# Patient Record
Sex: Male | Born: 1935 | Race: White | Hispanic: No | Marital: Married | Smoking: Former smoker
Health system: Southern US, Community
[De-identification: ages and names within clinical notes are randomized; demographics above are authoritative.]

## PROBLEM LIST (undated history)

## (undated) DIAGNOSIS — G4730 Sleep apnea, unspecified: Secondary | ICD-10-CM

## (undated) DIAGNOSIS — T7840XA Allergy, unspecified, initial encounter: Secondary | ICD-10-CM

## (undated) DIAGNOSIS — I495 Sick sinus syndrome: Secondary | ICD-10-CM

## (undated) DIAGNOSIS — I4891 Unspecified atrial fibrillation: Secondary | ICD-10-CM

## (undated) DIAGNOSIS — M1990 Unspecified osteoarthritis, unspecified site: Secondary | ICD-10-CM

## (undated) DIAGNOSIS — K5790 Diverticulosis of intestine, part unspecified, without perforation or abscess without bleeding: Secondary | ICD-10-CM

## (undated) DIAGNOSIS — I2510 Atherosclerotic heart disease of native coronary artery without angina pectoris: Secondary | ICD-10-CM

## (undated) DIAGNOSIS — K219 Gastro-esophageal reflux disease without esophagitis: Secondary | ICD-10-CM

## (undated) DIAGNOSIS — I272 Pulmonary hypertension, unspecified: Secondary | ICD-10-CM

## (undated) DIAGNOSIS — C801 Malignant (primary) neoplasm, unspecified: Secondary | ICD-10-CM

## (undated) DIAGNOSIS — I10 Essential (primary) hypertension: Secondary | ICD-10-CM

## (undated) DIAGNOSIS — K2289 Other specified disease of esophagus: Secondary | ICD-10-CM

## (undated) DIAGNOSIS — I509 Heart failure, unspecified: Secondary | ICD-10-CM

## (undated) DIAGNOSIS — E785 Hyperlipidemia, unspecified: Secondary | ICD-10-CM

## (undated) DIAGNOSIS — Z8709 Personal history of other diseases of the respiratory system: Secondary | ICD-10-CM

## (undated) DIAGNOSIS — I6529 Occlusion and stenosis of unspecified carotid artery: Secondary | ICD-10-CM

## (undated) DIAGNOSIS — I219 Acute myocardial infarction, unspecified: Secondary | ICD-10-CM

## (undated) DIAGNOSIS — R0602 Shortness of breath: Secondary | ICD-10-CM

## (undated) DIAGNOSIS — Z950 Presence of cardiac pacemaker: Secondary | ICD-10-CM

## (undated) DIAGNOSIS — E039 Hypothyroidism, unspecified: Secondary | ICD-10-CM

## (undated) DIAGNOSIS — K228 Other specified diseases of esophagus: Secondary | ICD-10-CM

## (undated) HISTORY — DX: Heart failure, unspecified: I50.9

## (undated) HISTORY — PX: CATARACT EXTRACTION: SUR2

## (undated) HISTORY — DX: Diverticulosis of intestine, part unspecified, without perforation or abscess without bleeding: K57.90

## (undated) HISTORY — PX: POLYPECTOMY: SHX149

## (undated) HISTORY — DX: Occlusion and stenosis of unspecified carotid artery: I65.29

## (undated) HISTORY — DX: Hyperlipidemia, unspecified: E78.5

## (undated) HISTORY — DX: Essential (primary) hypertension: I10

## (undated) HISTORY — DX: Atherosclerotic heart disease of native coronary artery without angina pectoris: I25.10

## (undated) HISTORY — PX: COX-MAZE MICROWAVE ABLATION: SHX1404

## (undated) HISTORY — DX: Shortness of breath: R06.02

## (undated) HISTORY — DX: Sleep apnea, unspecified: G47.30

## (undated) HISTORY — DX: Unspecified atrial fibrillation: I48.91

## (undated) HISTORY — DX: Other specified diseases of esophagus: K22.8

## (undated) HISTORY — DX: Other specified disease of esophagus: K22.89

## (undated) HISTORY — PX: EYE SURGERY: SHX253

## (undated) HISTORY — DX: Sick sinus syndrome: I49.5

## (undated) HISTORY — DX: Personal history of other diseases of the respiratory system: Z87.09

## (undated) HISTORY — DX: Pulmonary hypertension, unspecified: I27.20

## (undated) HISTORY — DX: Hypothyroidism, unspecified: E03.9

---

## 2003-02-01 HISTORY — PX: INSERT / REPLACE / REMOVE PACEMAKER: SUR710

## 2003-07-26 HISTORY — PX: CARDIAC CATHETERIZATION: SHX172

## 2003-07-28 ENCOUNTER — Inpatient Hospital Stay (HOSPITAL_COMMUNITY): Admission: EM | Admit: 2003-07-28 | Discharge: 2003-08-15 | Payer: Self-pay | Admitting: Emergency Medicine

## 2003-07-31 ENCOUNTER — Encounter: Payer: Self-pay | Admitting: Cardiology

## 2003-08-06 ENCOUNTER — Encounter (INDEPENDENT_AMBULATORY_CARE_PROVIDER_SITE_OTHER): Payer: Self-pay | Admitting: *Deleted

## 2003-08-06 HISTORY — PX: CORONARY ARTERY BYPASS GRAFT: SHX141

## 2003-08-06 HISTORY — PX: OTHER SURGICAL HISTORY: SHX169

## 2003-08-18 ENCOUNTER — Inpatient Hospital Stay (HOSPITAL_COMMUNITY): Admission: EM | Admit: 2003-08-18 | Discharge: 2003-08-21 | Payer: Self-pay | Admitting: Emergency Medicine

## 2003-08-20 ENCOUNTER — Encounter (INDEPENDENT_AMBULATORY_CARE_PROVIDER_SITE_OTHER): Payer: Self-pay | Admitting: *Deleted

## 2003-09-15 ENCOUNTER — Encounter (HOSPITAL_COMMUNITY): Admission: RE | Admit: 2003-09-15 | Discharge: 2003-12-14 | Payer: Self-pay | Admitting: Cardiology

## 2003-10-09 ENCOUNTER — Encounter: Admission: RE | Admit: 2003-10-09 | Discharge: 2003-10-09 | Payer: Self-pay | Admitting: Internal Medicine

## 2003-10-24 ENCOUNTER — Encounter: Admission: RE | Admit: 2003-10-24 | Discharge: 2003-10-24 | Payer: Self-pay | Admitting: Internal Medicine

## 2003-11-27 ENCOUNTER — Encounter: Admission: RE | Admit: 2003-11-27 | Discharge: 2003-11-27 | Payer: Self-pay | Admitting: Critical Care Medicine

## 2003-12-16 HISTORY — PX: US ECHOCARDIOGRAPHY: HXRAD669

## 2003-12-17 ENCOUNTER — Encounter (HOSPITAL_COMMUNITY): Admission: RE | Admit: 2003-12-17 | Discharge: 2003-12-20 | Payer: Self-pay | Admitting: Cardiology

## 2003-12-23 ENCOUNTER — Encounter (HOSPITAL_COMMUNITY): Admission: RE | Admit: 2003-12-23 | Discharge: 2004-03-22 | Payer: Self-pay | Admitting: Cardiology

## 2004-02-28 ENCOUNTER — Emergency Department (HOSPITAL_COMMUNITY): Admission: EM | Admit: 2004-02-28 | Discharge: 2004-02-28 | Payer: Self-pay | Admitting: Family Medicine

## 2004-03-01 ENCOUNTER — Ambulatory Visit: Payer: Self-pay | Admitting: Critical Care Medicine

## 2004-03-31 ENCOUNTER — Encounter (HOSPITAL_COMMUNITY): Admission: RE | Admit: 2004-03-31 | Discharge: 2004-06-29 | Payer: Self-pay | Admitting: Cardiology

## 2004-05-18 ENCOUNTER — Encounter: Admission: RE | Admit: 2004-05-18 | Discharge: 2004-05-18 | Payer: Self-pay | Admitting: Orthopedic Surgery

## 2004-07-01 ENCOUNTER — Encounter (HOSPITAL_COMMUNITY): Admission: RE | Admit: 2004-07-01 | Discharge: 2004-09-29 | Payer: Self-pay | Admitting: Cardiology

## 2004-10-01 ENCOUNTER — Encounter (HOSPITAL_COMMUNITY): Admission: RE | Admit: 2004-10-01 | Discharge: 2004-12-30 | Payer: Self-pay | Admitting: Cardiology

## 2005-07-19 ENCOUNTER — Emergency Department (HOSPITAL_COMMUNITY): Admission: EM | Admit: 2005-07-19 | Discharge: 2005-07-19 | Payer: Self-pay | Admitting: Family Medicine

## 2005-10-29 IMAGING — CR DG CHEST 1V PORT
1 series · 1 of 1 positions shown · non-contrast
Comparison: none

CLINICAL DATA: Bradycardia.
 PORTABLE CHEST, ONE VIEW ? 08/07/2003 ? (1062 HOURS)
 Comparison to earlier film of the same day.
 Bilateral chest tubes, mediastinal drains, swan-ganz catheter have been removed.  Right IJ venous sheath and right IJ central line stable in position.  No pneumothorax.  Patchy air space opacities in the right upper lobe, left mid and lower lung are largely stable.  No effusion.
 IMPRESSION
 1. Bilateral chest tube removal without pneumothorax.
 2. Stable bilateral asymmetric infiltrates.

[view not recorded]
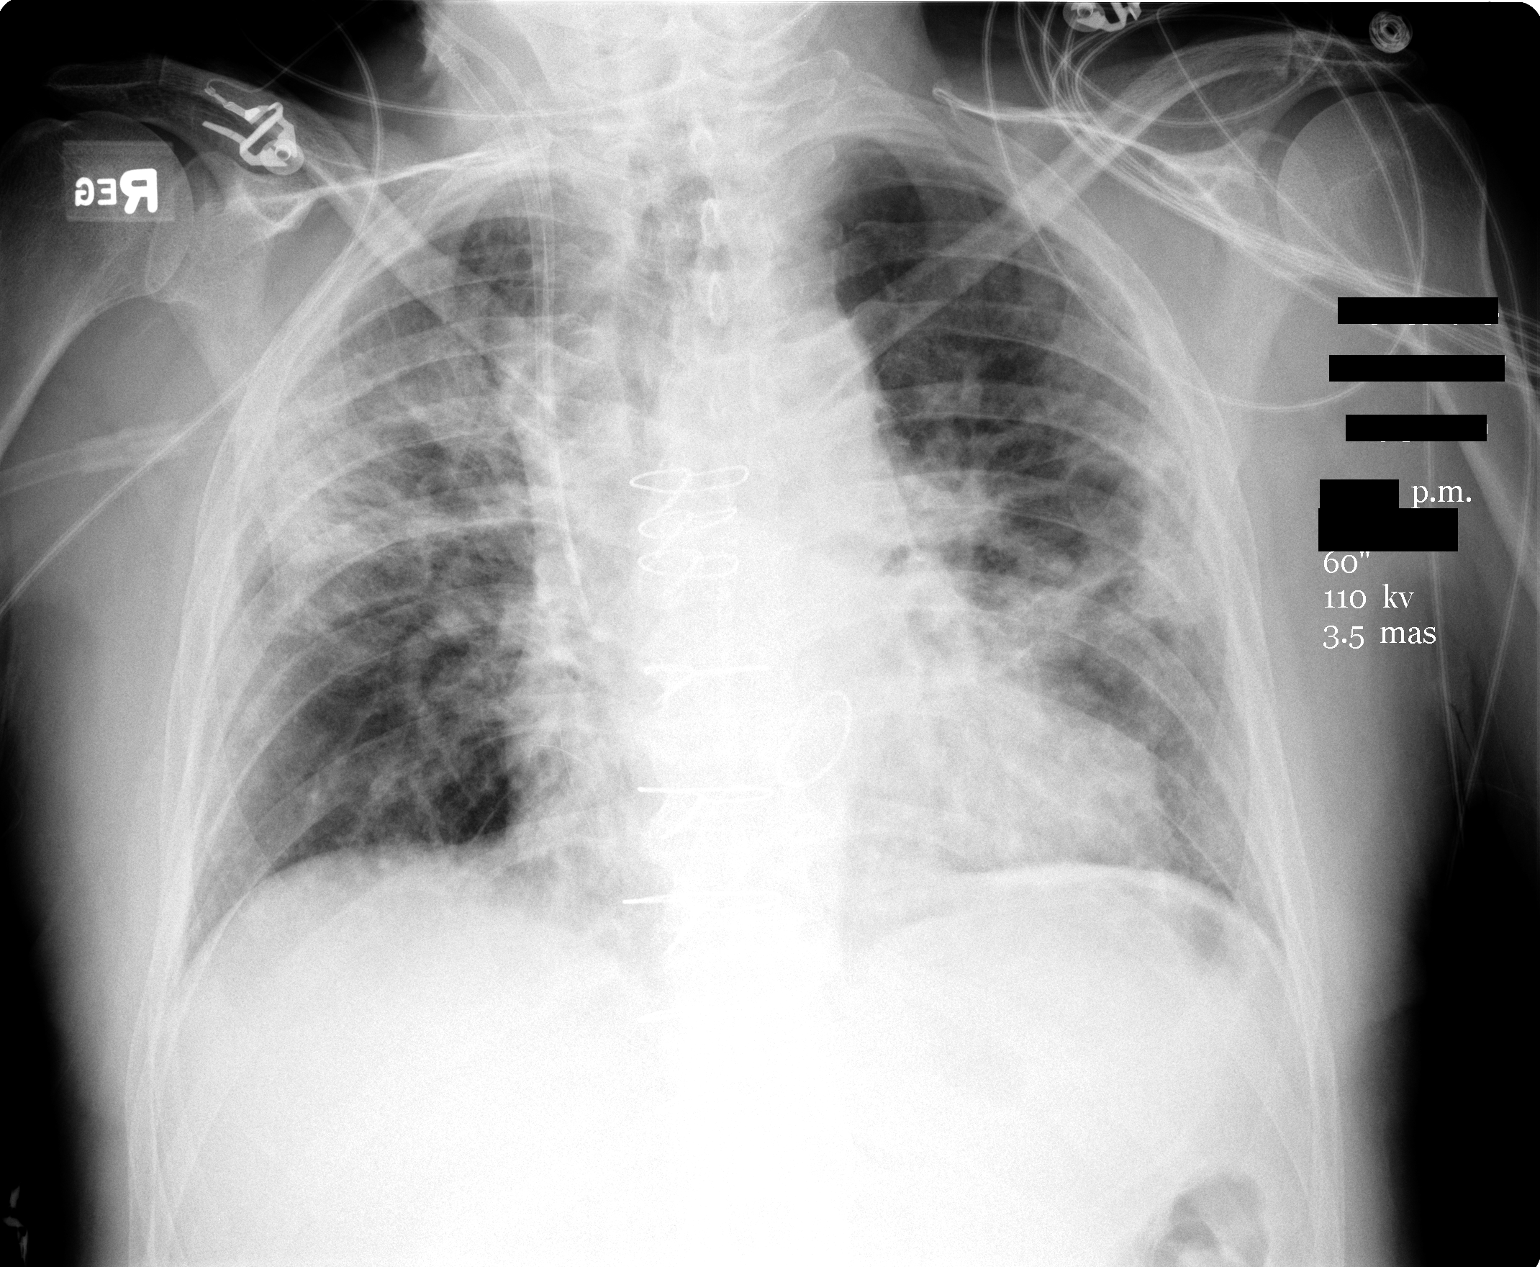

[1 of 1 positions shown; findings below may reference images not displayed]

## 2005-10-30 IMAGING — CR DG CHEST 1V PORT
1 series · 1 of 1 positions shown · non-contrast
Comparison: 08/07/03.

CLINICAL DATA: Bradycardia.  Chest tube removal.  
 CHEST ONE VIEW PORTABLE AT 0443 HOURS

[view not recorded]
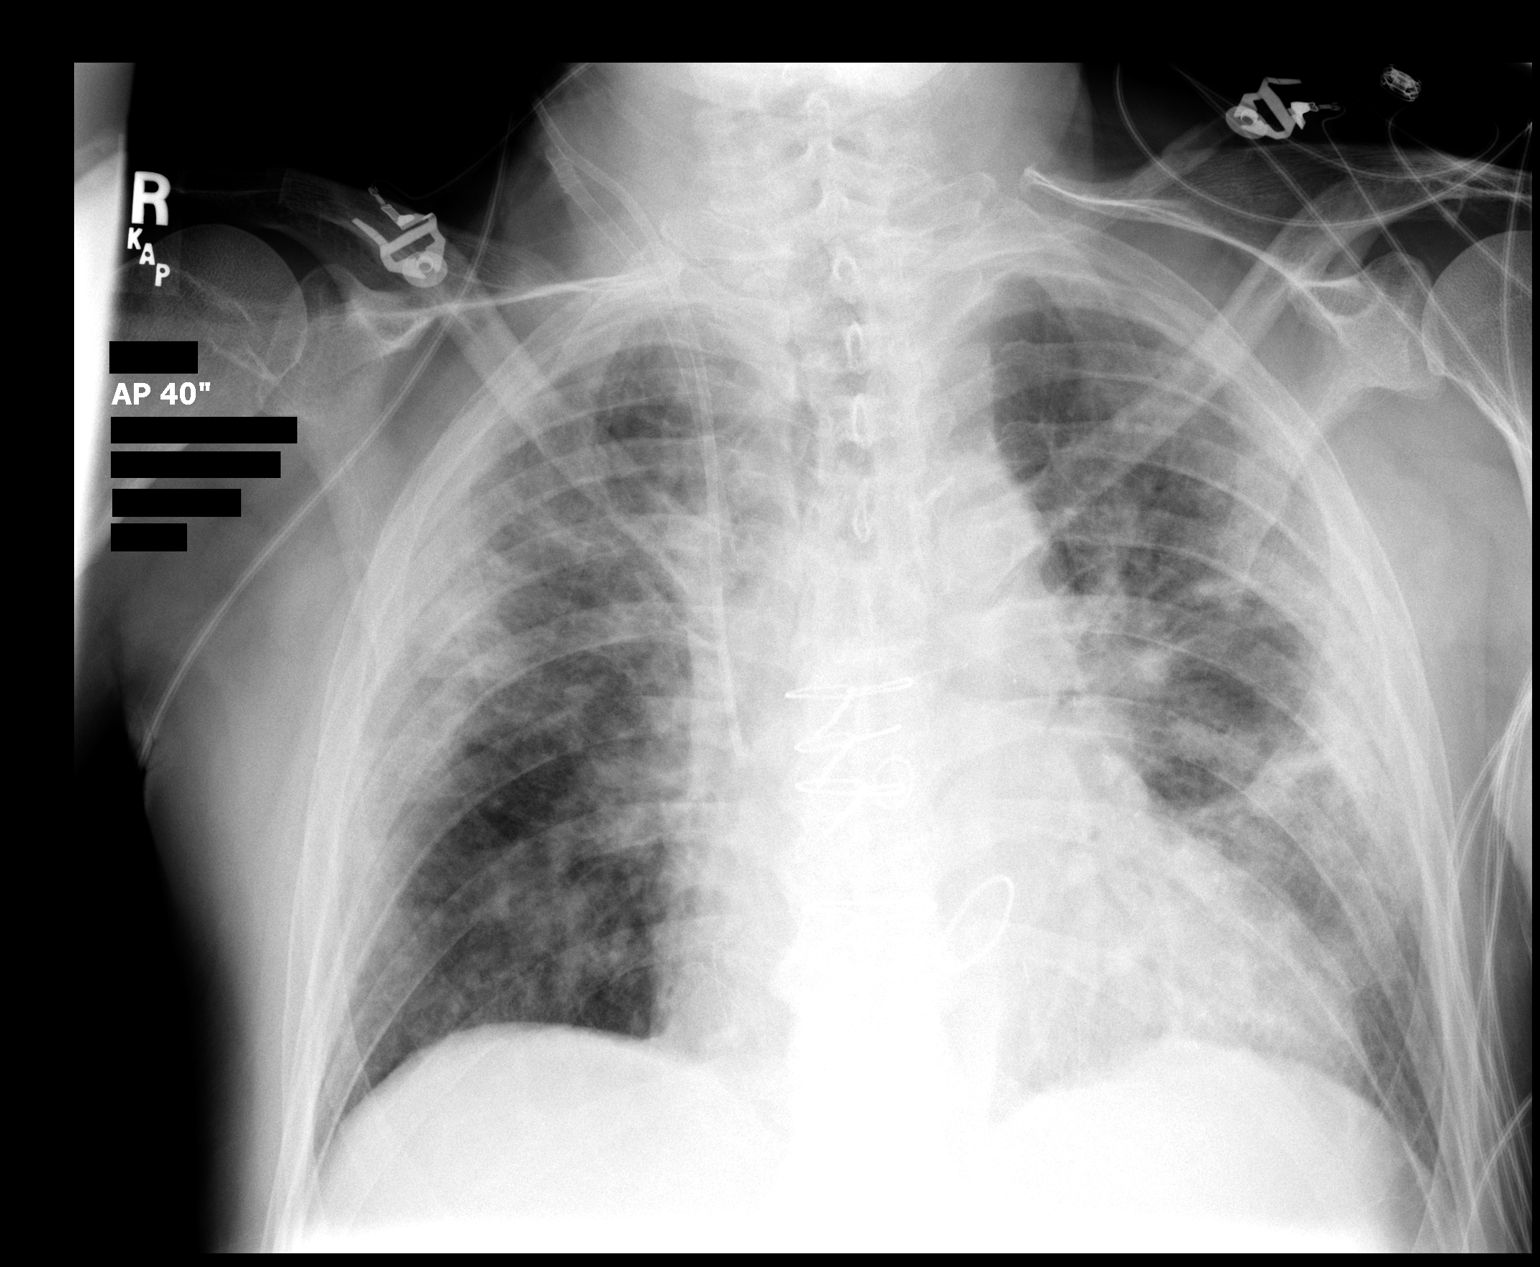

[1 of 1 positions shown; findings below may reference images not displayed]

Two central lines remain in place, grossly unchanged.  Some patchy bilateral lung density persists and is not significantly changed.  There is no pneumothorax on either side.  
 IMPRESSION
 No change.

## 2005-12-31 IMAGING — CR DG FOOT COMPLETE 3+V*L*
3 series · 3 of 3 positions shown · non-contrast
Comparison: none

CLINICAL DATA: Left hip pain, left foot pain and swelling since twist injury on 10/01/03.
 DIAGNOSTIC HIP, COMPLETE, LEFT ? 10/09/03 
 No significant osseous, articular, nor soft tissue abnormality is seen. 
 IMPRESSION   
 Negative.
 DIAGNOSTIC LEFT FOOT, COMPLETE (THREE VIEWS) ? 10/09/03 
 No fracture or subluxation is seen.  3 mm posterior and inferior calcaneal spurs are seen with mild degenerative changes at the anterior mid tarsal bones.  
 IMPRESSION
 1.  Slight degenerative changes, as described.
 2.  No other significant abnormality noted.

[view not recorded (1 of 3)]
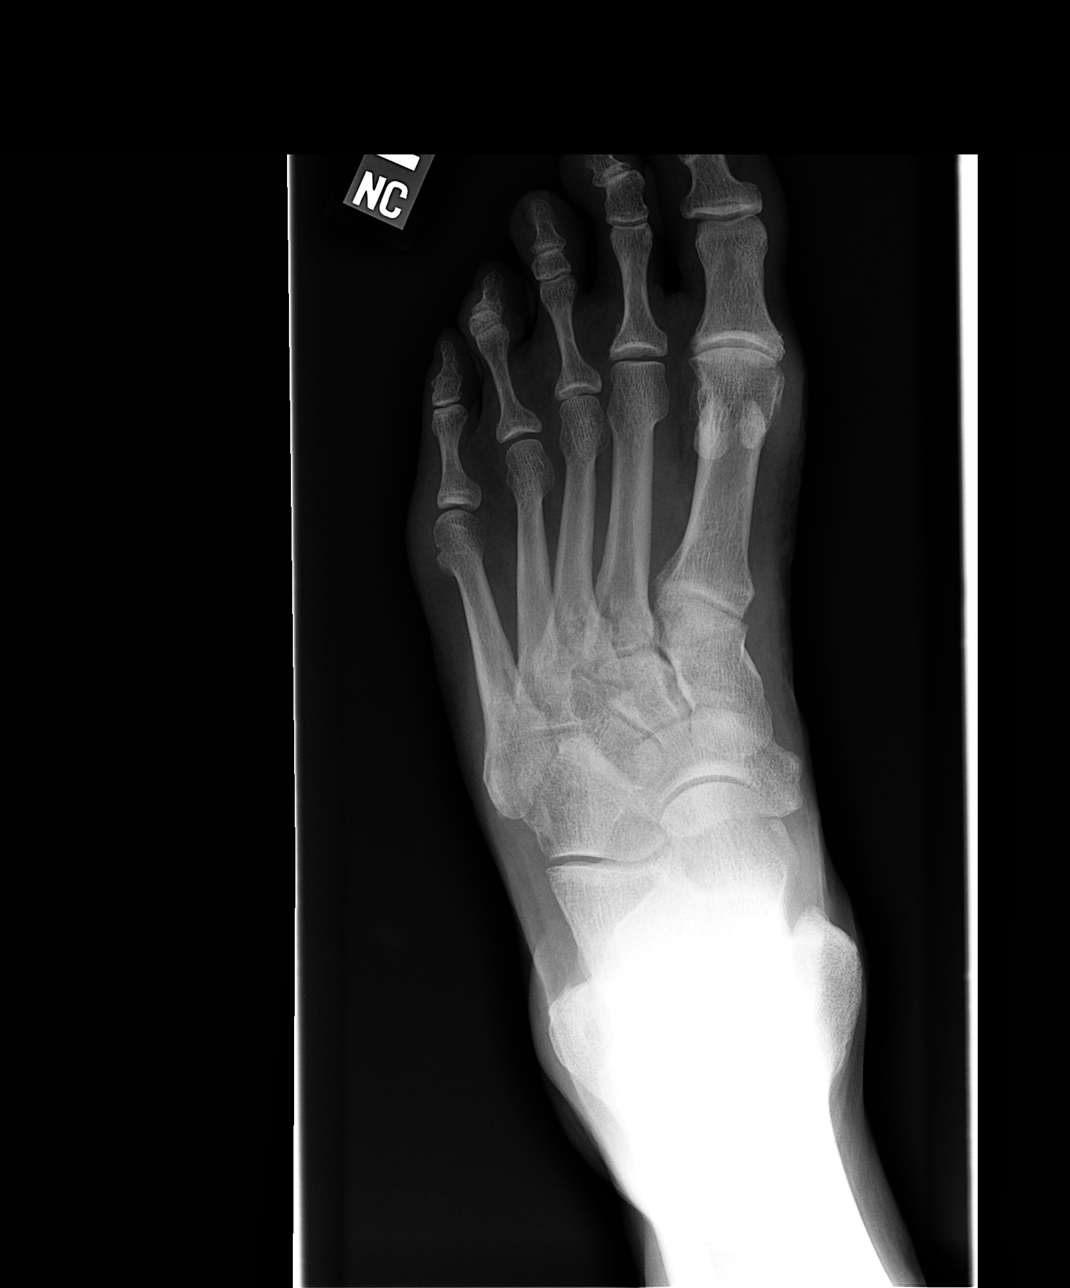

[view not recorded (2 of 3)]
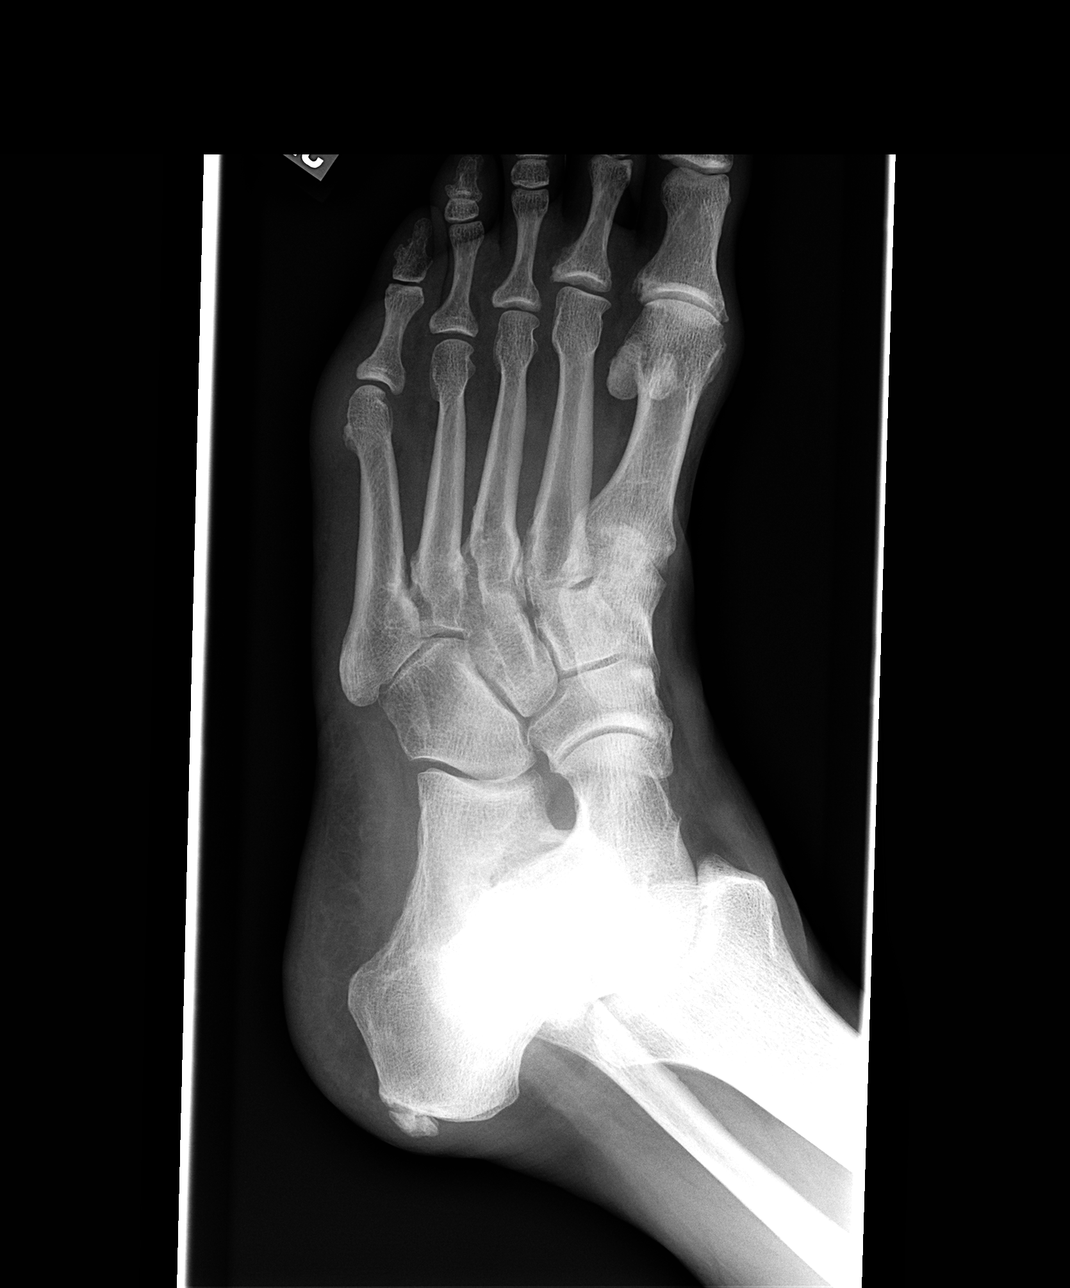

[view not recorded (3 of 3)]
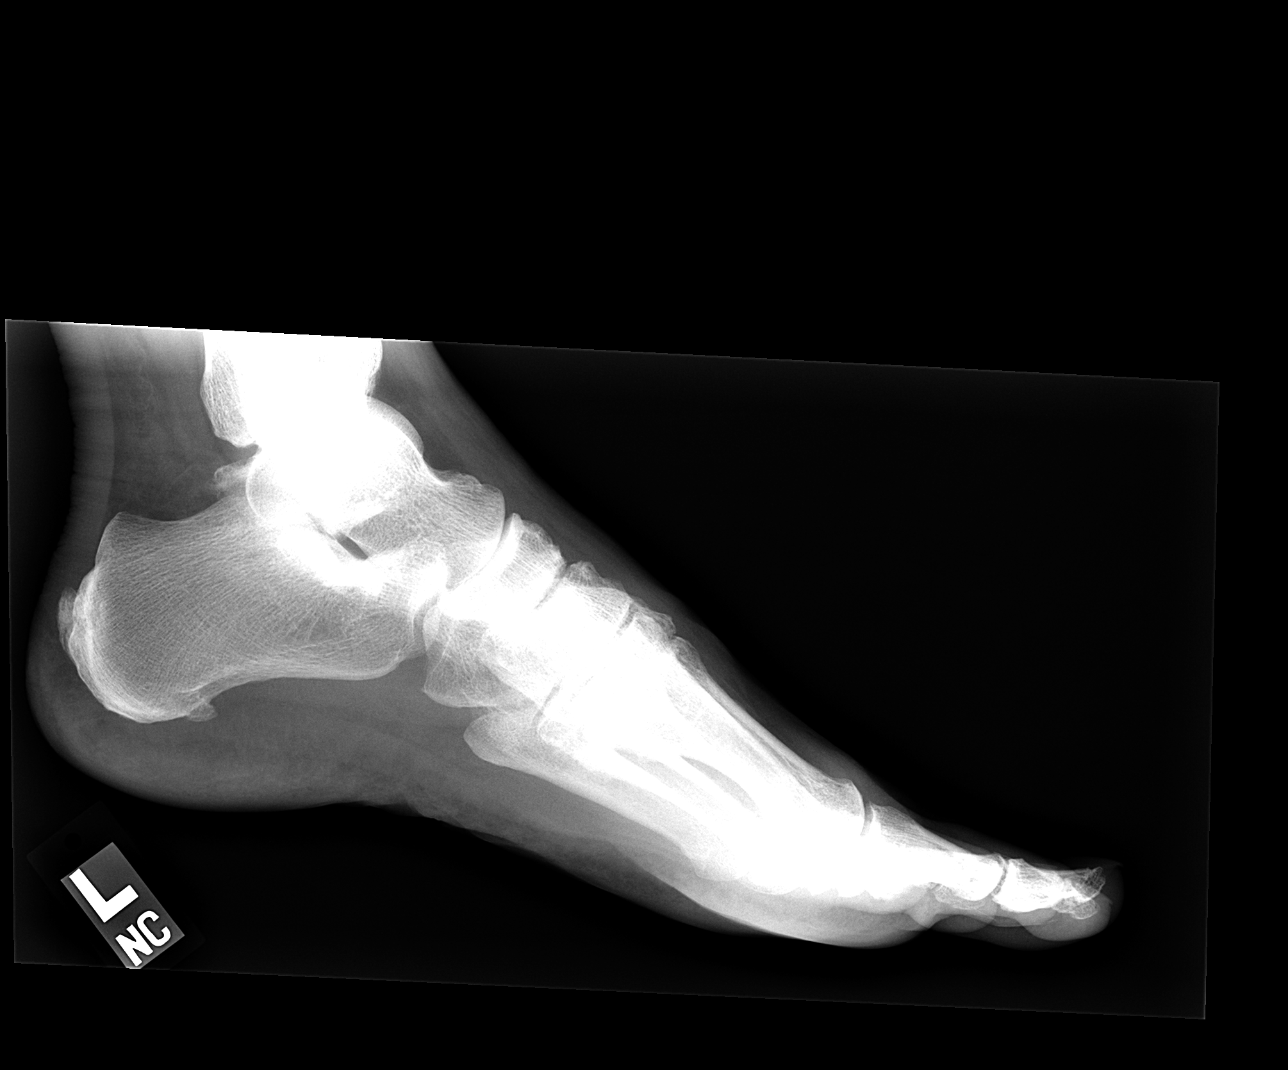

[3 of 3 positions shown; findings below may reference images not displayed]

## 2006-03-07 ENCOUNTER — Ambulatory Visit: Payer: Self-pay | Admitting: Vascular Surgery

## 2006-07-11 HISTORY — PX: CARDIOVASCULAR STRESS TEST: SHX262

## 2006-07-12 HISTORY — PX: US ECHOCARDIOGRAPHY: HXRAD669

## 2006-08-08 ENCOUNTER — Inpatient Hospital Stay (HOSPITAL_BASED_OUTPATIENT_CLINIC_OR_DEPARTMENT_OTHER): Admission: RE | Admit: 2006-08-08 | Discharge: 2006-08-08 | Payer: Self-pay | Admitting: Cardiology

## 2006-08-08 HISTORY — PX: CARDIAC CATHETERIZATION: SHX172

## 2006-09-06 ENCOUNTER — Ambulatory Visit: Payer: Self-pay | Admitting: Emergency Medicine

## 2006-09-06 LAB — CONVERTED CEMR LAB
Anti Nuclear Antibody(ANA): NEGATIVE
Rhuematoid fact SerPl-aCnc: <20 intl units/mL — ABNORMAL LOW (ref 0.0–20.0)

## 2006-09-12 ENCOUNTER — Ambulatory Visit: Payer: Self-pay | Admitting: Cardiology

## 2006-09-27 ENCOUNTER — Ambulatory Visit: Payer: Self-pay | Admitting: Emergency Medicine

## 2006-10-10 ENCOUNTER — Ambulatory Visit: Payer: Self-pay | Admitting: Emergency Medicine

## 2006-10-17 ENCOUNTER — Ambulatory Visit (HOSPITAL_COMMUNITY): Admission: RE | Admit: 2006-10-17 | Discharge: 2006-10-17 | Payer: Self-pay | Admitting: Emergency Medicine

## 2006-10-26 DIAGNOSIS — I2510 Atherosclerotic heart disease of native coronary artery without angina pectoris: Secondary | ICD-10-CM | POA: Insufficient documentation

## 2006-10-26 DIAGNOSIS — E785 Hyperlipidemia, unspecified: Secondary | ICD-10-CM | POA: Insufficient documentation

## 2006-10-26 DIAGNOSIS — I10 Essential (primary) hypertension: Secondary | ICD-10-CM | POA: Insufficient documentation

## 2006-10-26 DIAGNOSIS — E039 Hypothyroidism, unspecified: Secondary | ICD-10-CM | POA: Insufficient documentation

## 2006-10-26 DIAGNOSIS — M109 Gout, unspecified: Secondary | ICD-10-CM

## 2006-10-26 DIAGNOSIS — K222 Esophageal obstruction: Secondary | ICD-10-CM

## 2006-10-26 DIAGNOSIS — J309 Allergic rhinitis, unspecified: Secondary | ICD-10-CM | POA: Insufficient documentation

## 2006-11-06 ENCOUNTER — Ambulatory Visit: Payer: Self-pay | Admitting: *Deleted

## 2007-01-03 ENCOUNTER — Encounter: Payer: Self-pay | Admitting: Emergency Medicine

## 2007-01-03 ENCOUNTER — Ambulatory Visit: Payer: Self-pay | Admitting: Emergency Medicine

## 2007-01-03 DIAGNOSIS — R1313 Dysphagia, pharyngeal phase: Secondary | ICD-10-CM

## 2007-01-03 DIAGNOSIS — J8410 Pulmonary fibrosis, unspecified: Secondary | ICD-10-CM

## 2007-01-08 ENCOUNTER — Telehealth (INDEPENDENT_AMBULATORY_CARE_PROVIDER_SITE_OTHER): Payer: Self-pay | Admitting: *Deleted

## 2007-01-08 ENCOUNTER — Ambulatory Visit: Payer: Self-pay | Admitting: Internal Medicine

## 2007-01-10 ENCOUNTER — Telehealth: Payer: Self-pay | Admitting: Emergency Medicine

## 2007-01-12 ENCOUNTER — Ambulatory Visit: Payer: Self-pay | Admitting: Pulmonary Disease

## 2007-01-12 DIAGNOSIS — J209 Acute bronchitis, unspecified: Secondary | ICD-10-CM | POA: Insufficient documentation

## 2007-01-19 ENCOUNTER — Ambulatory Visit: Payer: Self-pay | Admitting: Emergency Medicine

## 2007-01-19 DIAGNOSIS — R059 Cough, unspecified: Secondary | ICD-10-CM | POA: Insufficient documentation

## 2007-01-19 DIAGNOSIS — R05 Cough: Secondary | ICD-10-CM

## 2007-01-29 ENCOUNTER — Telehealth: Payer: Self-pay | Admitting: Emergency Medicine

## 2007-01-30 ENCOUNTER — Ambulatory Visit: Payer: Self-pay | Admitting: Internal Medicine

## 2007-11-20 ENCOUNTER — Ambulatory Visit: Payer: Self-pay | Admitting: *Deleted

## 2008-08-06 ENCOUNTER — Ambulatory Visit: Payer: Self-pay | Admitting: Emergency Medicine

## 2008-12-02 ENCOUNTER — Ambulatory Visit: Payer: Self-pay | Admitting: Vascular Surgery

## 2009-06-01 ENCOUNTER — Ambulatory Visit: Payer: Self-pay | Admitting: Emergency Medicine

## 2009-06-01 ENCOUNTER — Ambulatory Visit: Payer: Self-pay | Admitting: Internal Medicine

## 2009-06-02 ENCOUNTER — Encounter: Payer: Self-pay | Admitting: Emergency Medicine

## 2009-06-17 ENCOUNTER — Encounter: Payer: Self-pay | Admitting: Emergency Medicine

## 2009-06-22 ENCOUNTER — Encounter: Payer: Self-pay | Admitting: Emergency Medicine

## 2009-06-24 ENCOUNTER — Ambulatory Visit: Payer: Self-pay | Admitting: Emergency Medicine

## 2009-06-25 ENCOUNTER — Telehealth: Payer: Self-pay | Admitting: Emergency Medicine

## 2009-06-25 DIAGNOSIS — J9611 Chronic respiratory failure with hypoxia: Secondary | ICD-10-CM

## 2009-07-07 ENCOUNTER — Ambulatory Visit: Payer: Self-pay | Admitting: Vascular Surgery

## 2009-10-13 ENCOUNTER — Ambulatory Visit (HOSPITAL_BASED_OUTPATIENT_CLINIC_OR_DEPARTMENT_OTHER): Admission: RE | Admit: 2009-10-13 | Discharge: 2009-10-13 | Payer: Self-pay | Admitting: Emergency Medicine

## 2009-10-13 ENCOUNTER — Encounter: Payer: Self-pay | Admitting: Pulmonary Disease

## 2009-11-04 ENCOUNTER — Ambulatory Visit: Payer: Self-pay | Admitting: Pulmonary Disease

## 2009-12-24 ENCOUNTER — Encounter: Payer: Self-pay | Admitting: Internal Medicine

## 2009-12-29 ENCOUNTER — Ambulatory Visit: Payer: Self-pay | Admitting: Emergency Medicine

## 2010-01-06 ENCOUNTER — Ambulatory Visit: Payer: Self-pay | Admitting: Vascular Surgery

## 2010-02-17 ENCOUNTER — Ambulatory Visit
Admission: RE | Admit: 2010-02-17 | Discharge: 2010-02-17 | Payer: Self-pay | Source: Home / Self Care | Attending: Internal Medicine | Admitting: Internal Medicine

## 2010-02-17 DIAGNOSIS — I4891 Unspecified atrial fibrillation: Secondary | ICD-10-CM | POA: Insufficient documentation

## 2010-02-17 DIAGNOSIS — I442 Atrioventricular block, complete: Secondary | ICD-10-CM | POA: Insufficient documentation

## 2010-02-17 DIAGNOSIS — Z950 Presence of cardiac pacemaker: Secondary | ICD-10-CM | POA: Insufficient documentation

## 2010-02-25 ENCOUNTER — Ambulatory Visit: Admission: RE | Admit: 2010-02-25 | Discharge: 2010-02-25 | Payer: Self-pay | Source: Home / Self Care

## 2010-02-25 ENCOUNTER — Ambulatory Visit (HOSPITAL_COMMUNITY)
Admission: RE | Admit: 2010-02-25 | Discharge: 2010-02-25 | Payer: Self-pay | Source: Home / Self Care | Attending: Internal Medicine | Admitting: Internal Medicine

## 2010-02-25 ENCOUNTER — Ambulatory Visit: Payer: Self-pay | Admitting: Cardiology

## 2010-03-04 NOTE — Assessment & Plan Note (Signed)
Summary: ILD, allergies   Visit Type:  Follow-up Primary Provider/Referring Provider:  Perini  CC:  3 wk followup.  Pt states that his breathing is some better since last seen by TP on 06/01/09.  He states that recently went to [facility] and he was able to walk very long distances in 90 degree weather without any dyspnea.  He states that since he returned to [facility] he is having SOB again but has started clariton and this has helped some.  Marland Kitchen  History of Present Illness: Mr Trefz is a 77 yo man, former smoker, with a hx of chronic cough and also ILD in a UIP pattern. Has aspiration on modified barium swallow that is largely avoided by chin tuck, taking meds in applesauce.   ROV 08/06/08 -- Has not been seen since 12/08. Has largely done well, able to do some activities, cut grass, etc. Cough is now much better, takes his meds with applesauce. Doesn't really do chin tuck. Started to notice worsening SOB when the weather got hot. Still having some trouble climbing stairs or even taking a shower. No edema. Last CT scan of the chest was in 12/08.   Jun 01, 2009--Presents for follow up of persistent symtpoms of dyspnea. Complains of increased SOB with activity x1.39months. Complains that his activity tolerance is not where he would like it. He is leaving on vacation soon and is worried that he will not be able to keep up with all the walking. He is quite [place] with yard work-mows grass. He declines mainly w/ actiivties at an incline-steps/hllls. if he rests symptoms relieved. At rest and on flat surfaces he does well. Today FEV1 at 77% w/ no desaturation  below 90% w/ walking in office. Denies chest pain, exertional chest pain.  orthopnea, hemoptysis, fever, n/v/d, edema, headache,recent travel or antibiotics.    ROV 06/24/09 -- returns for f/u ILD and chronic dyspnea. Seen 5/2 and at that time did not desaturate, had stable CXR. He just went to [place], did great with regard to his breathing. Now back in [place]  and he notices more dyspnea. He also has more PND, started loratadine a week ago. Has had his ONO, minimal desats per the company's report to the pt. I haven't reviewed yet. Followed by Dr Swaziland [place] cardiology.   Current Medications (verified): 1)  Synthroid 100 Mcg  Tabs (Levothyroxine Sodium) .... Take 1 Tablet By Mouth Once A Day 2)  Prevacid 30 Mg  Cpdr (Lansoprazole) .... Take 1 Capsule By Mouth Once A Day 3)  Allopurinol 100 Mg  Tabs (Allopurinol) .... Take 1 Tablet By Mouth Once A Day 4)  Atenolol 50 Mg  Tabs (Atenolol) .... Take 1 Tablet By Mouth Once A Day 5)  Cvs Aspirin Ec 81 Mg  Tbec (Aspirin) .... Take 1 Tablet By Mouth Once A Day 6)  Norvasc 2.5 Mg  Tabs (Amlodipine Besylate) .... Take 1 Tablet By Mouth Once A Day 7)  Coumadin 5 Mg  Tabs (Warfarin Sodium) .... Take 1 Tablet By Mouth Once A Day As Directed By Coumadin Clinic 8)  Klor-Con M20 20 Meq  Tbcr (Potassium Chloride Crys Cr) .... Take 1 Tablet By Mouth Once A Day 9)  Fenofibrate Micronized 200 Mg Caps (Fenofibrate Micronized) .... Once Daily 10)  Atenolol 50 Mg Tabs (Atenolol) .... Once Daily 11)  Benazepril Hcl 10 Mg Tabs (Benazepril Hcl) .... Once Daily 12)  Lovaza 1 Gm Caps (Omega-3-Acid Ethyl Esters) .... 4 Caps Once Daily 13)  Vitamin  D 1000 Unit Tabs (Cholecalciferol) .... Take 1 Tablet By Mouth Once A Day 14)  Multivital Performance  Tabs (Multiple Vitamins-Minerals) .... Take 1 Tablet By Mouth Once A Day 15)  Tamsulosin Hcl 0.4 Mg Caps (Tamsulosin Hcl) .... Take 1 Capsule By Mouth Once A Day 16)  Claritin 10 Mg Tabs (Loratadine) .Marland Kitchen.. 1 Once Daily  Allergies (verified): 1)  Codeine  Vital Signs:  Patient profile:   77 year old male Weight:      184 pounds BMI:     27.27 O2 Sat:      93 % on Room air Temp:     98.0 degrees F oral Pulse rate:   72 / minute BP sitting:   146 / 70  (left arm)  Vitals Entered By: Vernie Murders (Jun 24, 2009 9:33 AM)  O2 Flow:  Room air  Physical Exam  General:  well  developed, well nourished, in no acute distress Head:  normocephalic and atraumatic Eyes:  conjunctiva and sclera clear Nose:  no deformity, discharge, inflammation, or lesions Mouth:  no deformity or lesions Neck:  no masses, thyromegaly, or abnormal cervical nodes Chest Wall:  no deformities noted. midline cabg scar present. AICD present Lungs:  faint bibasilar crackles on inspiration Heart:  regular rate and rhythm, S1, S2 without murmurs, rubs, gallops, or clicks Abdomen:  not examined Msk:  no deformity or scoliosis noted with normal posture Extremities:  erythema and hair loss, chronic venous stasis changes Neurologic:  non-focal Skin:  intact without lesions or rashes Psych:  alert and cooperative; normal mood and affect; normal attention span and concentration   Impression & Recommendations:  Problem # 1:  INTERSTITIAL LUNG DISEASE (ICD-515) Stable by CXR and symptomatically. We discussed possible further w/u including full PFT to see if there is a COPD component here. Also discussed asking Dr Swaziland to get TTE to assess for pulm HTN assoc with ILD, although he is not hypoxemic. At this point we have agreed to defer further w/u. Will follow CXR and symptoms.   Problem # 2:  ALLERGIC RHINITIS (ICD-477.9)  Suspect that this is the factor that makes his breathing worse in [place], better in other locales. Will continue the loratadine once daily and follow.  His updated medication list for this problem includes:    Claritin 10 Mg Tabs (Loratadine) .Marland Kitchen... 1 once daily  Orders: Est. Patient Level IV (16109)  Medications Added to Medication List This Visit: 1)  Claritin 10 Mg Tabs (Loratadine) .Marland Kitchen.. 1 once daily  Patient Instructions: 1)  Continue your claritin once daily  2)  Follow up with Dr Delton Coombes in 6 months with a CXR, or sooner if you have any new problems.

## 2010-03-04 NOTE — Procedures (Signed)
Summary: Pulse Oximetry/IDS  Pulse Oximetry/IDS   Imported By: Sherian Rein 07/06/2009 15:11:47  _____________________________________________________________________  External Attachment:    Type:   Image     Comment:   External Document

## 2010-03-04 NOTE — Assessment & Plan Note (Signed)
Summary: Acute NP office visit - dyspnea   Primary Provider/Referring Provider:  Perini  CC:  increased SOB with activity x1.24months - denied wheezing and cough.  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 Has aspiration on modified barium swallow that is largely avoided by chin tuck, taking meds in applesauce.   ROV 08/06/08 -- Has not been seen since 12/08. Has largely done well, able to do some activities, cut grass, etc. Cough is now much better, takes his meds with applesauce. Doesn't really do chin tuck. Started to notice worsening SOB when the weather got hot. Still having some trouble climbing stairs or even taking a shower. No edema. Last CT scan of the chest was in 12/08.   Jun 01, 2009--Presents for follow up of persistent symtpoms of dyspnea. Complains of increased SOB with activity x1.72months. Complains that his activity tolerance is not where he would like it. He is leaving on vacation soon and is worried that he will not be able to keep up with all the walking. He is quite [place] with yard work-mows grass. He declines mainly w/ actiivties at an incline-steps/hllls. if he rests symptoms relieved. At rest and on flat surfaces he does well. Today FEV1 at 77% w/ no desaturation  below 90% w/ walking in office. Denies chest pain, exertional chest pain.  orthopnea, hemoptysis, fever, n/v/d, edema, headache,recent travel or antibiotics.    Medications Prior to Update: 1)  Synthroid 100 Mcg  Tabs (Levothyroxine Sodium) .... Take 1 Tablet By Mouth Once A Day 2)  Prevacid 30 Mg  Cpdr (Lansoprazole) .... Take 1 Capsule By Mouth Once A Day 3)  Allopurinol 100 Mg  Tabs (Allopurinol) .... Take 1 Tablet By Mouth Once A Day 4)  Atenolol 50 Mg  Tabs (Atenolol) .... Take 1 Tablet By Mouth Once A Day 5)  Cvs Aspirin Ec 81 Mg  Tbec (Aspirin) .... Take 1 Tablet By Mouth Once A Day 6)  Lasix 20 Mg  Tabs (Furosemide) ....  Take 1 Tablet By Mouth Once A Day 7)  Norvasc 2.5 Mg  Tabs (Amlodipine Besylate) .... Take 1 Tablet By Mouth Once A Day 8)  Coumadin 5 Mg  Tabs (Warfarin Sodium) .... As Directed 9)  Klor-Con M20 20 Meq  Tbcr (Potassium Chloride Crys Cr) .... Take 1 Tablet By Mouth Once A Day 10)  Fenofibrate Micronized 200 Mg Caps (Fenofibrate Micronized) .... Once Daily 11)  Atenolol 50 Mg Tabs (Atenolol) .... Once Daily 12)  Benazepril Hcl 10 Mg Tabs (Benazepril Hcl) .... Once Daily 13)  Lovaza 1 Gm Caps (Omega-3-Acid Ethyl Esters) .... 4 Caps Once Daily 14)  Vitamin D .... Once Daily 15)  Premium Performance Multivitamin .... Once Daily  Current Medications (verified): 1)  Synthroid 100 Mcg  Tabs (Levothyroxine Sodium) .... Take 1 Tablet By Mouth Once A Day 2)  Prevacid 30 Mg  Cpdr (Lansoprazole) .... Take 1 Capsule By Mouth Once A Day 3)  Allopurinol 100 Mg  Tabs (Allopurinol) .... Take 1 Tablet By Mouth Once A Day 4)  Atenolol 50 Mg  Tabs (Atenolol) .... Take 1 Tablet By Mouth Once A Day 5)  Cvs Aspirin Ec 81 Mg  Tbec (Aspirin) .... Take 1 Tablet By Mouth Once A Day 6)  Norvasc 2.5 Mg  Tabs (Amlodipine Besylate) .... Take 1 Tablet By Mouth Once A Day 7)  Coumadin 5 Mg  Tabs (  Warfarin Sodium) .... Take 1 Tablet By Mouth Once A Day As Directed By Coumadin Clinic 8)  Klor-Con M20 20 Meq  Tbcr (Potassium Chloride Crys Cr) .... Take 1 Tablet By Mouth Once A Day 9)  Fenofibrate Micronized 200 Mg Caps (Fenofibrate Micronized) .... Once Daily 10)  Atenolol 50 Mg Tabs (Atenolol) .... Once Daily 11)  Benazepril Hcl 10 Mg Tabs (Benazepril Hcl) .... Once Daily 12)  Lovaza 1 Gm Caps (Omega-3-Acid Ethyl Esters) .... 4 Caps Once Daily 13)  Vitamin D 1000 Unit Tabs (Cholecalciferol) .... Take 1 Tablet By Mouth Once A Day 14)  Multivital Performance  Tabs (Multiple Vitamins-Minerals) .... Take 1 Tablet By Mouth Once A Day 15)  Tamsulosin Hcl 0.4 Mg Caps (Tamsulosin Hcl) .... 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  Risk Factors: Smoking Status: quit (10/26/2006) Packs/Day: 1ppd (10/26/2006) Passive Smoke Exposure: yes (01/30/2007)  Family History: rheumatism - mother stroke - father   Social History: former smoker, quit 1970s x21yrs, 1ppd wine every other day  married 3 children, 6 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 No Comments Medications reviewed with patient Daytime contact number verified with patient. Boone Master CNA  Jun 01, 2009 2:58 PM    Physical Exam  Additional Exam:  GEN: A/Ox3; pleasant , NAD HEENT:  [place]/AT, , EACs-clear, TMs-wnl, NOSE-clear, THROAT-clear NECK:  Supple w/ fair ROM; no JVD; normal carotid impulses w/o bruits; no thyromegaly or nodules palpated; no lymphadenopathy. RESP  Clear to P & A; w/o, wheezes/ rales/ or rhonchi. CARD:  RRR, no m/r/g   GI:   Soft & nt; nml bowel sounds; no organomegaly or masses detected. Musco: Warm bil,  no calf tenderness edema, clubbing, pulses intact Neuro:intact w/ no focal deficits noted.     Pulmonary Function Test Date: 06/01/2009 3:40 PM Gender: Male  Pre-Spirometry FVC    Value: 2.83 L/min   % Pred: 71.20 % FEV1    Value: 2.22 L     Pred: 2.85 L     % Pred: 77.90 % FEV1/FVC  Value: 78.49 %     % Pred: 108.80 %  Impression & Recommendations:  Problem # 1:  INTERSTITIAL LUNG DISEASE (ICD-515)  w/ associated DOE (mainly w/ inclines) will check xray for progression of ILD.  Lung fxn relatively well preserved w/ no sign desaturation w/ ambulation  advised on advancing activity as tolerated, and rest breaks.  will set up for  ONO  follow up 3 weeks Dr. Delton Coombes   Orders: DME Referral (DME) T-2 View CXR (71020TC) Est. Patient Level IV (16109)  Medications Added to Medication List This Visit: 1)  Coumadin 5 Mg Tabs (Warfarin sodium) .... Take 1 tablet by mouth once a day as directed by coumadin clinic 2)  Vitamin D 1000 Unit Tabs (Cholecalciferol) .... Take 1 tablet by mouth once a day 3)  Multivital Performance Tabs (Multiple vitamins-minerals) .... Take 1 tablet by mouth once a day 4)  Tamsulosin Hcl 0.4 Mg Caps (Tamsulosin hcl) .... Take 1 capsule by mouth once a day  Complete Medication List: 1)  Synthroid 100 Mcg Tabs (Levothyroxine sodium) .... Take 1 tablet by mouth once a day 2)  Prevacid 30 Mg Cpdr (Lansoprazole) .... Take 1 capsule by mouth once a day 3)  Allopurinol 100 Mg Tabs (Allopurinol) .... Take 1 tablet by mouth once a day 4)  Atenolol 50 Mg Tabs (Atenolol) .... Take 1 tablet by mouth once a day 5)  Cvs Aspirin Ec 81 Mg Tbec (Aspirin) .... Take 1 tablet by mouth once a day 6)  Norvasc 2.5 Mg Tabs (Amlodipine besylate) .... Take 1 tablet by mouth once a day 7)  Coumadin 5 Mg Tabs (Warfarin sodium) .... Take 1 tablet by mouth once a day as directed by coumadin clinic 8)  Klor-con M20 20 Meq Tbcr (Potassium chloride crys cr) .... Take 1 tablet by mouth once a day 9)  Fenofibrate Micronized 200 Mg Caps (Fenofibrate micronized) .... Once daily 10)   Atenolol 50 Mg Tabs (Atenolol) .... Once daily 11)  Benazepril Hcl 10 Mg Tabs (Benazepril hcl) .... Once daily 12)  Lovaza 1 Gm Caps (Omega-3-acid ethyl esters) .... 4 caps once daily 13)  Vitamin D 1000 Unit Tabs (Cholecalciferol) .... Take 1 tablet by mouth once a day 14)  Multivital Performance Tabs (Multiple vitamins-minerals) .... Take 1 tablet by mouth once a day 15)  Tamsulosin Hcl 0.4 Mg Caps (Tamsulosin hcl) .... Take 1 capsule by mouth once a day  Patient Instructions: 1)  I will call with xray results.  2)  We are setting you up for overnight oximetry for when you return from your trip.  3)  follow up Dr. Delton Coombes in 3 weeks  4)  Please contact office for sooner follow up if symptoms do not improve or worsen    Immunization History:  Influenza Immunization History:    Influenza:  historical (01/31/2009)   CardioPerfect Spirometry  ID: [id number] Patient: [name] [name], [name] [name] DOB: 1930-12-26 Age: 77 Years Old Sex: Male Race: White Physician: Parrett,Tammy Height: 69 Weight: 187.50 Smoker: No PPD: 1ppd Has Pacemaker? Yes Status: Unconfirmed Past Medical History:  Allergic rhinitis Coronary artery disease Gout Hyperlipidemia Hypertension Hypothyroidism IPF Recorded: 06/01/2009 3:40 PM  Parameter  Measured Predicted %Predicted FVC     2.83        3.97        71.20 FEV1     2.22        2.85        77.90 FEV1%   78.49        72.16        108.80 PEF    5.06        7.35        68.90   Interpretation:     Ambulatory Pulse Oximetry  Resting; HR 95    02 Sat 62  Lap1 (185 feet)   HR_94_   02 Sat_90 Lap2 (185  feet)   HR_91____   02 Sat__95___    Lap3 (185 feet)   HR__88___   02 Sat__94___  ___Test Completed without Difficulty ___Test Stopped due to:  Reynaldo Minium CMA  Jun 01, 2009 3:52 PM

## 2010-03-04 NOTE — Miscellaneous (Signed)
Summary: Device preload  Clinical Lists Changes  Observations: Added new observation of PPM INDICATN: A-fib  (12/24/2009 10:48) Added new observation of MAGNET RTE: BOL 85 ERI 65 (12/24/2009 10:48) Added new observation of PPMLEADSTAT2: active (12/24/2009 10:48) Added new observation of PPMLEADSER2: [id number]  (12/24/2009 10:48) Added new observation of PPMLEADMOD2: 4470  (12/24/2009 10:48) Added new observation of PPMLEADDOI2: 08/12/2003  (12/24/2009 10:48) Added new observation of PPMLEADLOC2: RV  (12/24/2009 10:48) Added new observation of PPMLEADSTAT1: active  (12/24/2009 10:48) Added new observation of PPMLEADSER1: [id number]  (12/24/2009 10:48) Added new observation of PPMLEADMOD1: 4469  (12/24/2009 10:48) Added new observation of PPMLEADDOI1: 08/12/2003  (12/24/2009 10:48) Added new observation of PPMLEADLOC1: RA  (12/24/2009 10:48) Added new observation of PPM DOI: 08/12/2003  (12/24/2009 10:48) Added new observation of PPM SERL#: [id number]  (12/24/2009 10:48) Added new observation of PPM MODL#: [id number]  (12/24/2009 01:02) Added new observation of PACEMAKERMFG: [employer]  (12/24/2009 10:48) Added new observation of PPM IMP MD: Charlynn Court  (12/24/2009 10:48) Added new observation of PPM REFER MD: Peter Swaziland, MD  (12/24/2009 10:48) Added new observation of PACEMAKER MD: Sherryl Manges, MD  (12/24/2009 10:48)      PPM Specifications Following MD:  Sherryl Manges, MD     Referring MD:  Peter Swaziland, MD PPM Vendor:  [employer]onic     PPM Model Number:  [id number]     PPM Serial Number:  [id number] PPM DOI:  08/12/2003     PPM Implanting MD:  Charlynn Court  Lead 1    Location: RA     DOI: 08/12/2003     Model #: [id number]     Serial #: [id number]     Status: active Lead 2    Location: RV     DOI: 08/12/2003     Model #: [id number]     Serial #: [id number]     Status: active  Magnet Response Rate:  BOL 85 ERI 65  Indications:  A-fib

## 2010-03-04 NOTE — Assessment & Plan Note (Signed)
Summary: pacer check/medtronic   Visit Type:  PPM-Medtronic Referring Provider:  [place] Primary Provider:  Waynard Edwards  CC:  shortness of breath.  History of Present Illness: [name] is seen at the request of Dr. [place] to establish pacemaker followup.  He is a 77 year old gentleman who underwent bypass surgery in 2005with prior tricuspid and mitral valve annuloplasty. He underwent catheterization in 2008 because of positive dyspnea. His bypasses were patent his left ventricular function was near normal. He continues to have problems with dyspnea. He felt by Dr. Corliss Blacker to have chronic lung disease as well as been identified as having sleep apnea.  He denies chest pain. He has a single-chamber pacemaker and presumably permanent atrial fibrillation.    Problems Prior to Update: 1)  Sleep Apnea  (ICD-780.57) 2)  Hypoxemia  (ICD-799.02) 3)  Cough  (ICD-786.2) 4)  Acute Bronchitis  (ICD-466.0) 5)  Interstitial Lung Disease  (ICD-515) 6)  Dysphagia Pharyngeal Phase  (ZOX-096.04) 7)  Hx of Stricture, Esophageal  (ICD-530.3) 8)  Hypothyroidism  (ICD-244.9) 9)  Hypertension  (ICD-401.9) 10)  Hyperlipidemia  (ICD-272.4) 11)  Gout  (ICD-274.9) 12)  Coronary Artery Disease  (ICD-414.00) 13)  Allergic Rhinitis  (ICD-477.9)  Current Medications (verified): 1)  Synthroid 100 Mcg  Tabs (Levothyroxine Sodium) .... Take 1 Tablet By Mouth Once A Day 2)  Prevacid 30 Mg  Cpdr (Lansoprazole) .... Take 1 Capsule By Mouth Once A Day 3)  Allopurinol 100 Mg  Tabs (Allopurinol) .... Take 1 Tablet By Mouth Once A Day 4)  Atenolol 50 Mg  Tabs (Atenolol) .... Take 1 Tablet By Mouth Once A Day 5)  Cvs Aspirin Ec 81 Mg  Tbec (Aspirin) .... Take 1 Tablet By Mouth Once A Day 6)  Norvasc 2.5 Mg  Tabs (Amlodipine Besylate) .... Take 1 Tablet By Mouth Once A Day 7)  Coumadin 5 Mg  Tabs (Warfarin Sodium) .... Take 1 Tablet By Mouth Once A Day As Directed By [facility]madin Clinic 8)  Klor-Con M20 20 Meq  Tbcr (Potassium  Chloride Crys Cr) .... Take 1 Tablet By Mouth Once A Day 9)  Fenofibrate Micronized 200 Mg Caps (Fenofibrate Micronized) .... Once Daily 10)  Benazepril Hcl 10 Mg Tabs (Benazepril Hcl) .... Once Daily 11)  Lovaza 1 Gm Caps (Omega-3-Acid Ethyl Esters) .... 4 Caps Once Daily 12)  Vitamin D 1000 Unit Tabs (Cholecalciferol) .... Take 1 Tablet By Mouth Once A Day 13)  Multivital Performance  Tabs (Multiple Vitamins-Minerals) .... Take 1 Tablet By Mouth Once A Day 14)  Tamsulosin Hcl 0.4 Mg Caps (Tamsulosin Hcl) .... Take 1 Capsule By Mouth Once A Day 15)  Calcium-Vitamin D3 600-200 Mg-Unit Tabs (Calcium Carbonate-Vitamin D) .... 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   Vital Signs:  Patient profile:   77 year old male Height:      69 inches Weight:  185 pounds BMI:     27.42 Pulse rate:   85 / minute Resp:     18 per minute BP sitting:   150 / 77  Vitals Entered By: Caralee Ates CMA (February 17, 2010 3:01 PM)  Physical Exam  General:  Well developed, well nourished,Caucasian male appearing his stated age in no acute distress. Head:  normal HEENT Neck:  supple without JVD or thyromegaly Chest Wall:  without kyphosis Lungs:  decreased breath sounds without  wheezing Heart:  regular rate and rhythm without murmurs or gallops Msk:  Back normal, normal gait. Muscle strength and tone normal. Extremities:  without clubbing cyanosis or edema Neurologic:  alert and oriented and grossly normal motor and sensory function Skin:  Intact without lesions or rashes. Cervical Nodes:  without adenopathy Psych:  normal affect   PPM Specifications Following MD:  Sherryl Manges, MD     Referring MD:  Peter [place], MD PPM Vendor:  Medtronic     PPM Model Number:  [id number]     PPM Serial Number:  [id number] PPM DOI:  08/12/2003     PPM Implanting MD:  Charlynn Court  Lead 1    Location: [facility]     DOI: 08/12/2003     Model #: [id number]     Serial #: [id number]     Status: active Lead 2    Location: [facility]     DOI: 08/12/2003     Model #: [id number]     Serial #: [id number]     Status: active  Magnet Response Rate:  BOL 85 ERI 65  Indications:  A-fib    Impression & Recommendations:  Problem # 1:  CORONARY ARTERY DISEASE (ICD-414.00) stable post bypass. He is presumed to have interstitial lung disease as his cause of dyspnea. Given long-term ventricular pacing will evaluate his LV function with an echo His updated medication list for this problem includes:    Atenolol 50 Mg Tabs (Atenolol) .Marland Kitchen... Take 1 tablet by mouth once a day    Cvs Aspirin Ec 81 Mg Tbec (Aspirin) .Marland Kitchen... Take 1 tablet by mouth once a day    Norvasc 2.5 Mg Tabs (Amlodipine besylate) .Marland Kitchen... Take 1 tablet by mouth once a day    Coumadin 5 Mg Tabs (Warfarin sodium) .Marland Kitchen... Take 1 tablet by mouth once a day as directed by coumadin clinic    Benazepril Hcl 10 Mg Tabs (Benazepril hcl) ..... Once daily  Orders: Echocardiogram (Echo)  Problem # 2:  ATRIAL FIBRILLATION (ICD-427.31) presumed permanent and on Coumadin. I would recommend that we stop his aspirin His updated medication list for this problem includes:    Atenolol 50 Mg Tabs (Atenolol) .Marland Kitchen... Take 1 tablet by mouth once a day    Cvs Aspirin Ec 81 Mg Tbec  (Aspirin) .Marland Kitchen... Take 1 tablet by mouth once a day    Coumadin 5 Mg Tabs (Warfarin sodium) .Marland Kitchen... Take 1 tablet by mouth once a day as directed by coumadin clinic  Problem # 3:  AV BLOCK, COMPLETE (ICD-426.0) stable post His updated medication list for this problem includes:    Atenolol 50 Mg Tabs (Atenolol) .Marland Kitchen... Take 1 tablet by mouth once a day    Cvs Aspirin Ec 81 Mg Tbec (Aspirin) .Marland Kitchen... Take 1 tablet by mouth once a day    Norvasc 2.5 Mg Tabs (Amlodipine besylate) .Marland Kitchen... Take 1 tablet by mouth once a day    Coumadin 5 Mg Tabs (Warfarin sodium) .Marland Kitchen... Take 1 tablet by mouth once  a day as directed by coumadin clinic    Benazepril Hcl 10 Mg Tabs (Benazepril hcl) ..... Once daily  Problem # 4:  PACEMAKER, PERMANENT (ICD-V45.01) his pacemaker was interrogated. Ventricular lead impedance was 695 ohms with an estimated longevity of his battery of 3 years at a voltage of 2.76.  he is mostly device dependent although he occasionally did have a sensed QRS. Heart rate distribution was adequate  Patient Instructions: 1)  Your physician recommends that you continue on your current medications as directed. Please refer to the Current Medication list given to you today. 2)  Your physician wants you to follow-up in:  6 months with [facility]. You will receive a reminder letter in the mail two months in advance. If you don't receive a letter, please call our office to schedule the follow-up appointment. 3)  Your physician has requested that you have an echocardiogram.  Echocardiography is a painless test that uses sound waves to create images of your heart. It provides your doctor with information about the size and shape of your heart and how well your heart's chambers and valves are working.  This procedure takes approximately one hour. There are no restrictions for this procedure.

## 2010-03-04 NOTE — Progress Notes (Signed)
Summary: ONO reuslts  Phone Note Outgoing Call   Call placed by: Boone Master CNA/MA,  Jun 25, 2009 9:48 AM Call placed to: Patient Summary of Call: [facility] to inform patient that per TP and Dr. Basilia Jumbo shows that he requires oxygen at bedtime.  it states that he was below 88% about 15% of the night.  per [facility], pt wanted to hear the results from this office rather than the home health company. Initial call taken by: Boone Master CNA/MA,  Jun 25, 2009 9:49 AM  Follow-up for Phone Call        [facility] TCB. Boone Master CNA/MA  Jun 26, 2009 10:25 AM   Additional Follow-up for Phone Call Additional follow up Details #1::        called spoke with patient.  he states that he does not want to wear oxygen at bedtime and that it was conveyed to him that it wasn't necessary and wants to continue with just the claritin.  i did inform patient that his o2 level dropped to 88% for approx 15% of the night or for 1.5hrs which shows that he does need it with sleeping.  pt still refused.  will forward to Dr. Delton Coombes. Additional Follow-up by: Boone Master CNA/MA,  Jun 30, 2009 11:55 AM  New Problems: HYPOXEMIA (ICD-799.02)   Additional Follow-up for Phone Call Additional follow up Details #2::    Spoke w patient regarding ONO results. Pt tells me that he feels rested in the am, he does occassionally nap in the afternoons. He is willing to get PSG, wants to defer until September.  Follow-up by: Leslye Peer MD,  July 01, 2009 3:03 PM  New Problems: HYPOXEMIA [phone])

## 2010-03-04 NOTE — Procedures (Signed)
Summary: Order for Oximetry/IDS  Order for Oximetry/IDS   Imported By: Sherian Rein 06/12/2009 09:58:10  _____________________________________________________________________  External Attachment:    Type:   Image     Comment:   External Document

## 2010-03-04 NOTE — Miscellaneous (Signed)
Summary: Oxygen/[facility]  Oxygen/[facility]   Imported By: Sherian Rein 06/25/2009 14:59:43  _____________________________________________________________________  External Attachment:    Type:   Image     Comment:   External Document

## 2010-03-04 NOTE — Assessment & Plan Note (Signed)
Summary: ILD, new dx OSA   Visit Type:  Follow-up Copy to:  [place] Primary Provider/Referring Provider:  Waynard Edwards  CC:  6 month followup ILD.  Pt states that he feels that his breathing has improved since last seen.  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 Has aspiration on modified barium swallow that is largely avoided by chin tuck, taking meds in applesauce.   ROV 08/06/08 -- Has not been seen since 12/08. Has largely done well, able to do some activities, cut grass, etc. Cough is now much better, takes his meds with applesauce. Doesn't really do chin tuck. Started to notice worsening SOB when the weather got hot. Still having some trouble climbing stairs or even taking a shower. No edema. Last CT scan of the chest was in 12/08.   Jun 01, 2009--Presents for follow up of persistent symtpoms of dyspnea. Complains of increased SOB with activity x1.4months. Complains that his activity tolerance is not where he would like it. He is leaving on vacation soon and is worried that he will not be able to keep up with all the walking. He is quite [place] with yard work-mows grass. He declines mainly w/ actiivties at an incline-steps/hllls. if he rests symptoms relieved. At rest and on flat surfaces he does well. Today FEV1 at 77% w/ no desaturation  below 90% w/ walking in office. Denies chest pain, exertional chest pain.  orthopnea, hemoptysis, fever, n/v/d, edema, headache,recent travel or antibiotics.    ROV 06/24/09 -- returns for f/u ILD and chronic dyspnea. Seen 5/2 and at that time did not desaturate, had stable CXR. He just went to [place], did great with regard to his breathing. Now back in [place] and he notices more dyspnea. He also has more PND, started loratadine a week ago. Has had his ONO, minimal desats per the company's report to the pt. I haven't reviewed yet. Followed by Dr  [place] [facility].   ROV 12/29/09 -- returns for f/u of ILD. Says he is feeling very well, better exercise tolerance. Allergies controlled. Cough resolved. He does swallowing precautions.   Current Medications (verified): 1)  Synthroid 100 Mcg  Tabs (Levothyroxine Sodium) .... Take 1 Tablet By Mouth Once A Day 2)  Prevacid 30 Mg  Cpdr (Lansoprazole) .... Take 1 Capsule By Mouth Once A Day 3)  Allopurinol 100 Mg  Tabs (Allopurinol) .... Take 1 Tablet By Mouth Once A Day 4)  Atenolol 50 Mg  Tabs (Atenolol) .... Take 1 Tablet By Mouth Once A Day 5)  Cvs Aspirin Ec 81 Mg  Tbec (Aspirin) .... Take 1 Tablet By Mouth Once A Day 6)  Norvasc 2.5 Mg  Tabs (Amlodipine Besylate) .... Take 1 Tablet By Mouth Once A Day 7)  Coumadin 5 Mg  Tabs (Warfarin Sodium) .... Take 1 Tablet By Mouth Once A Day As Directed By Coumadin Clinic 8)  Klor-Con M20 20 Meq  Tbcr (Potassium Chloride Crys Cr) .... Take 1 Tablet By Mouth Once A Day 9)  Fenofibrate Micronized 200 Mg Caps (Fenofibrate Micronized) .... Once Daily 10)  Benazepril Hcl 10 Mg Tabs (Benazepril Hcl) .... Once Daily 11)  Lovaza 1 Gm Caps (Omega-3-Acid Ethyl Esters) .... 4 Caps Once Daily 12)  Vitamin D 1000 Unit Tabs (Cholecalciferol) .... Take 1 Tablet By Mouth Once A Day 13)  Multivital Performance  Tabs (Multiple  Vitamins-Minerals) .... Take 1 Tablet By Mouth Once A Day 14)  Tamsulosin Hcl 0.4 Mg Caps (Tamsulosin Hcl) .... Take 1 Capsule By Mouth Once A Day  Allergies (verified): 1)  Codeine  Vital Signs:  Patient profile:   77 year old male Weight:      185 pounds O2 Sat:      93 % on Room air Temp:     98.0 degrees F oral Pulse rate:   83 / minute BP sitting:   124 / 60  (left arm)  Vitals Entered By: Vernie Murders (December 29, 2009 1:23 PM)  O2 Flow:  Room air CC: 6 month followup ILD.  Pt states that he feels that his breathing has improved since last seen.  He denies any complaints today.   Physical Exam  General:  well  developed, well nourished, in no acute distress Head:  normocephalic and atraumatic Eyes:  conjunctiva and sclera clear Nose:  no deformity, discharge, inflammation, or lesions Mouth:  no deformity or lesions Neck:  no masses, thyromegaly, or abnormal cervical nodes Chest Wall:  no deformities noted. midline cabg scar present. AICD present Lungs:  faint bibasilar crackles on inspiration Heart:  regular rate and rhythm, S1, S2 without murmurs, rubs, gallops, or clicks Abdomen:  not examined Msk:  no deformity or scoliosis noted with normal posture Extremities:  erythema and hair loss, chronic venous stasis changes Neurologic:  non-focal Skin:  intact without lesions or rashes Psych:  alert and cooperative; normal mood and affect; normal attention span and concentration   Impression & Recommendations:  Problem # 1:  INTERSTITIAL LUNG DISEASE (ICD-515) Likely due to aspiration, clinically stable, CXR stable today.  - continue swallowing precautions, meds w applesauce - follow CXR - he may have some superimposed COPD, but we have never done PFT because he has no dyspnea  Problem # 2:  HYPOXEMIA (ICD-799.02) He has not wanted to wear O2 with exertion or at night. We revisited the issue today, he continues to feel well and wants to defer  Problem # 3:  SLEEP APNEA (ICD-780.57)  PSG confirms moderate to severe OSA. We discussed pros, cons of treatment. Again he wants to defer. We agreed to revisit the issue if he develops any problems.   Orders: Est. Patient Level III (86578)  Other Orders: T-2 View CXR (71020TC) T-2 View CXR (71020TC)  Patient Instructions: 1)  Continue to use precautions when swallowing. 2)  We will not pursue treatment for sleep apnea at this time, but if your symptoms change you may benefit from this.  3)  Follow up with Dr Delton Coombes in 1 year with a CXR, or sooner if you have any problems.

## 2010-06-15 NOTE — Assessment & Plan Note (Signed)
OFFICE VISIT   Cudmore, Clenton L  DOB:  1931/01/02                                       07/07/2009  ZOXWR#:[id number]   CHIEF COMPLAINT:  Follow-up bilateral mild-to-moderate carotid disease.   HISTORY OF PRESENT ILLNESS:  Patient is a 77 year old gentleman with a  history of coronary artery disease, status post coronary bypass grafting  in 2005, blood pressure, high cholesterol, and noted mild-to-moderate  carotid disease which is asymptomatic.  He is here today for follow-up  studies on both carotids.  The patient denies any symptoms of stroke.  No amaurosis fugax.  No dysarthria.  No difficulty swallowing.   VASCULAR LAB:  Today ultrasound of the carotid showed an end-diastolic  velocity of 14 on the right and 38 on the left.  The last vascular lab  done 6 months ago showed an end-diastolic of 21 on the right and 76 on  the left.  Previous studies also showed the end diastolic range between  50 and 60.  The right is basically unchanged.   PHYSICAL EXAMINATION:  This is a well-developed, well-nourished  gentleman in no acute distress.  His heart rate is 65.  His sats are  97%.  His respiratory rate is 12.  He has good and equal strength of  bilateral upper and lower extremities.  He is alert and oriented x3.  He  has no facial droop and no tongue deviation.   ASSESSMENT:  Followup of mild to moderate carotid stenosis, mild on the  right and moderate on the left, both asymptomatic.   PLAN:  Repeat the studies in 6 months.   Della Goo, PA-C   Quita Skye. Hart Rochester, M.D.  Electronically Signed   RR/MEDQ  D:  07/07/2009  T:  07/07/2009  Job:  [id number]

## 2010-06-15 NOTE — Procedures (Signed)
CAROTID DUPLEX EXAM   INDICATION:  Followup of known carotid artery disease.  The patient is  asymptomatic.   HISTORY:  Diabetes:  No  Cardiac:  CABG x3 plus TVR on 08/06/2003 by Dr. Cornelius Moras  Hypertension:  Yes  Smoking:  No  Previous Surgery:  No  CV History:  Amaurosis Fugax:  No, Paresthesias:  No, Hemiparesis:  No                                       RIGHT             LEFT  Brachial systolic pressure:         122               132  Brachial Doppler waveforms:         WNL               WNL  Vertebral direction of flow:        Antegrade         Antegrade  DUPLEX VELOCITIES (cm/sec)  CCA peak systolic                   64                54  ECA peak systolic                   90                96  ICA peak systolic                   86                158  ICA end diastolic                   16                62  PLAQUE MORPHOLOGY:                  Mixed             Mixed  PLAQUE AMOUNT:                      Mild-moderate     Moderate  PLAQUE LOCATION:                    Bifurcation, ICA  Bifurcation, ICA   IMPRESSION:  1. 1% to 39% right internal carotid artery stenosis.  2. 40% to 59% stenosis of left internal carotid artery by new velocity  criteria.  3. Patent external carotid and vertebral arteries bilaterally.   Essentially unchanged from study of 11/01/2005.   ___________________________________________  P. Bud Face, M.D.   PB/MEDQ  D:  11/06/2006  T:  11/07/2006  Job:  [id number]

## 2010-06-15 NOTE — Assessment & Plan Note (Signed)
[facility]                             PULMONARY OFFICE NOTE   NAME:Ruacho, [name] [name]                     MRN:          [id number]  DATE:10/10/2006                            DOB:          [birth year]    SUBJECTIVE:  [name] [name] is a 77 year old gentleman with a history of  coronary artery disease and a coronary artery bypass graft surgery and  also interstitial lung disease in a UIP pattern, of unclear etiology.  He has been having progressive dyspnea, which is likely related to his  interstitial pulmonary process.  He has had pulmonary function testing  and a CT scan of the chest performed, and we are here to review the  results today.  He tells me that he continues to be short of breath with  exertion, but that his dyspnea is somewhat better than it was on our  initial visit.  He is able to cut his grass on flat ground.  He is able  to walk up some inclines.  He does not mow his yard when there are hills  present.   CURRENT MEDICATIONS:  These were reviewed and are correct, as indicated  on his clinic chart.   PHYSICAL EXAMINATION:  GENERAL:  This is a well-appearing gentleman, in  no distress.  VITAL SIGNS:  Weight 179 pounds, temperature 96.8 degrees, blood  pressure 114/74, heart rate 71, SPO2 was 96% on room air.  HEENT:  Benign.  LUNGS:  He has some mild bilateral lower lobe inspiratory crackles, more  so on the right than on the left.  There are no wheezes.  HEART:  Regular without murmur.  ABDOMEN:  Soft, nontender, non-distended.  Positive bowel sounds.  EXTREMITIES:  No clubbing, cyanosis or edema.   A high resolution CT scan of the chest was performed and shows increased  sub-pleural interstitial changes with the lower lobe predominant.  They  are progressed slightly compared with 2005.  There are some associated  ground glass changes as well.  He has some reactive mediastinal  lymphadenopathy that is unchanged from his previous  films.  The changes  are consistent with a UIP pattern that has progressed somewhat since  2005.   Ambulatory oximetry shows no desaturation after three laps, performed on  September 06, 2006.   Pulmonary function testing was performed on September 27, 2006.  This  showed normal air flows without a bronchodilator response.  Normal total  lung capacity and residual volume.  He did have decreased effusion  capacity, which corrected into the normal range when adjusted for  alveolar volume.   LABORATORY DATA:  Rheumatoid factor was negative. Anti-double-stranded  DNA was negative.  ANA was negative.  ANCA was negative.  Anti-SL-70  antibody was negative.   IMPRESSION/PLAN:  Mild exertional dyspnea, in the setting of progressive  interstitial lung disease in a usual interstitial pneumonia pattern, of  unclear etiology.  His pulmonary function tests are quite reassuring.  His only abnormality is a low diffusion capacity, but his CT scan of the  chest shows progression compared with 2005.  There is no clear  etiology,  based on his auto immune panel from his laboratory studies.  We  discussed the possible risks and benefits of starting prednisone.  He  wants to defer at this time, given his minimal symptoms, but we will re-  consider this if he has significant clinical or radiographic changes.  I  will perform a modified barium swallow to rule out occult aspiration as  a cause for his interstitial lung disease and scarring.   FOLLOWUP:  He will follow up with me to review the results.     [name] Peer, MD  Electronically Signed    RSB/MedQ  DD: 10/10/2006  DT: 10/11/2006  Job #: [id number]   cc:   [name] [name], M.D.

## 2010-06-15 NOTE — H&P (Signed)
NAME:  [name] [name], [name] [name]              ACCOUNT NO.:  [id number]   MEDICAL RECORD NO.:  [id number]          PATIENT TYPE:  OIB   LOCATION:  NA                           FACILITY:  [facility]   PHYSICIAN:  Peter M. Swaziland, M.D.  DATE OF BIRTH:  1930/09/08   DATE OF ADMISSION:  08/08/2006  DATE OF DISCHARGE:                              HISTORY & PHYSICAL   HISTORY OF PRESENT ILLNESS:  Mr. Weil is a 77 year old white male who  is seen for evaluation of symptoms of dyspnea.  He has a known history  of coronary artery disease.  He presented in 2005 with a non-Q-wave  myocardial infarction.  He had mitral and tricuspid insufficiency.  He  underwent coronary artery bypass surgery by Dr. Cornelius Moras including LIMA  graft placed to the distal LAD and vein graft to the circumflex marginal  branch and a vein graft to the posterolateral branch and mitral and  tricuspid valve annuloplasties and a MAZE procedure.  He also had a  pacemaker implanted for sick sinus syndrome.  He subsequently did not  regain normal rhythm and remained in atrial fibrillation chronically.  He has been on chronic anticoagulation.  He does have a history of  pulmonary fibrosis.  He presented recently with increasing dyspnea.  He  subsequently underwent evaluation with laboratory test which showed  normal CBC, TSH, and BMET.  He had BNP level of 55.  Echocardiogram  showed mild concentric LVH with low normal systolic function with  ejection fraction 50-55%.  There were no regional wall motion  abnormalities.  He was noted to have moderate pulmonary hypertension  with estimated right ventricular systolic pressure of 50 mmHg.  He  subsequently had an adenosine Cardiolite study.  This demonstrated a  fixed inferior basal defect consistent with scar.  There was evidence of  reversible apical ischemia.  Ejection fraction again was 50%.  Given  these findings it is recommended he undergo cardiac catheterization to  further determine the  etiology of his dyspnea whether this be cardiac  ischemia or pulmonary fibrosis with pulmonary hypertension.   PAST MEDICAL HISTORY:  He has a history of hypertension and history of  hyperlipidemia.  He has a history of GI bleeding in 1983 after some  benign polyps were removed from his colon.  He has a history of gout.  History of gastroesophageal reflux disease, history of chronic atrial  fibrillation, history of pulmonary hypertension, history of pulmonary  fibrosis, history of BPH, history of coronary artery disease as noted  above.   ALLERGIES:  He is allergic to PENICILLIN.   MEDICATIONS:  1. Coumadin 5 mg five days a week, 7.5 mg two days a week currently on      hold for his procedure.  2. Multivitamin daily.  3. Lotensin 20 mg daily.  4. Synthroid 100 mcg daily.  5. Allopurinol 300 mg per day.  6. Atenolol 50 mg per day.  7. Aspirin 81 mg per day.  8. Prevacid 30 mg daily.  9. Lasix 40 mg per day.  10.Norvasc 2.5 mg daily.  11.Potassium 20 mEq daily.  12.Welchol  625 mg six tablets daily,  13.Zetia 10 mg daily.  14.Lovasa 4 g daily.   SOCIAL HISTORY:  Patient is married.  He has three children.  He owns a  Metallurgist business.  He quit smoking in 1980.  He denies any  significant alcohol intake.   FAMILY HISTORY:  Father died at age 69 of stroke.  Mother died at age 78  of hypertension.  One brother died of coronary disease after second  bypass operation.  One brother died of cerebral hemorrhage.   REVIEW OF SYSTEMS:  His Review of Systems are as otherwise noted.   HISTORY OF PRESENT ILLNESS:  Otherwise, negative.   PHYSICAL EXAMINATION:  GENERAL:  The patient is a pleasant white male in  no distress.  VITAL SIGNS:  Weight 180, blood pressure 130/70, pulse 82 and regular,  respirations are normal.  HEENT:  Normocephalic, atraumatic.  Pupils equal, round, reactive to  light and accommodation.  Extraocular movements are full.  Oropharynx is  clear.  NECK:   Supple without JVD, adenopathy, thyromegaly or bruits.  LUNGS:  Clear.  CARDIAC:  Reveals a grade 1/6 systolic murmur at the apex.  There is no  S3.  ABDOMEN:  Soft, nontender.  EXTREMITIES:  He has no lower extremity edema.  Pulses are 2+ and  symmetric.  NEUROLOGIC:  Nonfocal.   LABORATORY DATA:  Recent pacemaker check did demonstrate normal  pacemaker function with excellent battery voltage.   IMPRESSION:  1. Symptoms of dyspnea probably multifactorial.  Recent evidence of      apical ischemia noted on Cardiolite stick study.  The patient has      evidence of pulmonary hypertension by echocardiogram.  2. Coronary artery disease status post CABG.  3. Status post mitral and tricuspid valve annuloplasties.  4. Status post MAZE procedure.  5. Pulmonary fibrosis.  6. Hypertension.  7. Hyperlipidemia.   PLAN:  Will proceed with diagnostic cardiac catheterization.           ______________________________  Peter M. [place]il[place], M.D.     PMJ/MEDQ  D:  08/07/2006  T:  08/07/2006  Job:  [id number]   cc:   Loraine Leriche A. Perini, M.D.

## 2010-06-15 NOTE — Assessment & Plan Note (Signed)
[name] [name]                             PULMONARY OFFICE NOTE   NAME:[name], [name] MCGLASSON                     MRN:          [id number]  DATE:09/06/2006                            DOB:          01-Nov-1930    REASON FOR CONSULTATION:  We were asked by Dr. Peter Swaziland to evaluate  [name] [name] for interstitial lung disease and progressive dyspnea.   BRIEF HISTORY:  [name] [name] is a 77 year old man with a history of  coronary artery disease status post CABG, allergic rhinitis, and prior  esophageal dilation.  He is also status post permanent pacemaker  placement.  He has been followed by Dr. Swaziland post CABG and had been  relatively stable.  He tells me that he has started to notice some  slowly progressive exertional dyspnea over the last several months.  He  notices it particularly when he walks up steps or walks on an incline.  Given these symptoms and his history of coronary disease, a left heart  catheterization was performed that showed all of his grafts to be patent  without any evidence of left ventricular compromise.  This has turned  the focus of his dyspnea evaluation to primary pulmonary process.  Mr.  [name] does have a history of pulmonary disease and in fact, in July  2005 was noted to have some mild peripheral subpleural interstitial lung  disease with evidence of mild scar in a UIP pattern.  No clear etiology  was established for his interstitial lung disease but it was felt that  it may have been steroid responsive in the past.  He has never been  diagnosed to his knowledge with any autoimmune process or rheumatoid  arthritis.  He does have a history of dysphagia and esophageal dilation  which raises the question of possible aspiration symptoms or  scleroderma.  He denies any overt aspiration and does not cough when he  eats or drinks.  He has not had any significant cough, wheeze, chest  discomfort over the last several months.  He has  only expressed  progressive exertional dyspnea.   PAST MEDICAL HISTORY:  1. Hypertension.  2. Coronary artery disease status post CABG 3 years ago with left      heart catheterization recently that established patency of his      grafts.  3. History of esophageal dilation.  4. Hypercholesterolemia.  5. Allergic rhinitis.  6. Pacemaker placement.  7. Hypothyroidism.  8. Gout.   ALLERGIES:  CODEINE.   SOCIAL HISTORY:  The patient is a former smoker with approximately 20  pack-year total history.  He quit 40 years ago.  He is married and lives  with his wife.  He has worked in [employer] and is  currently retired.  He has never owned pets and has never had birds in  the home.  He did not use significant alcohol.   FAMILY HISTORY:  Significant for rheumatoid arthritis in his mother.   REVIEW OF SYSTEMS:  As per the HPI.   EXAMINATION:  GENERAL:  This is a pleasant, comfortable gentleman on  room air.  His weight is 180 pounds, temperature 97.3, blood pressure  144/72, heart rate 68, SPO2 96% on room air.  HEENT:  The oropharynx is benign.  His posterior pharynx is without any  erythema.  NECK:  Supple without lymphadenopathy or stridor.  LUNGS:  Significant for very mild inspiratory crackles at the bilateral  lower lobes, more so on the right than on the left.  HEART:  Regular without murmur.  ABDOMEN:  Soft, nontender, nondistended with positive bowel sounds.  EXTREMITIES:  Have no cyanosis, clubbing or edema.   A chest x-ray performed February 28, 2006, shows some mild diffuse  interstitial prominence, more so on the right than on the left.   IMPRESSION:  Interstitial lung disease in a usual interstitial pneumonia  pattern of unclear etiology.  It may have been steroid responsive in the  past.  I suspect that it is playing a role in his exertional dyspnea.   PLANS:  1. Full pulmonary function testing.  2. Repeat high-resolution CT scan of the chest to  assess his      interstitial lung disease for progression.  This will be compared      to films from 2005.  3. I will check a rheumatoid factor, ANA and scleroderma panel to      assess for possible causes of his interstitial lung disease.  4. A walking oximetry will be performed today to rule out occult      hypoxemia.  5. We will consider therapy after the evaluation above has been      completed.  This may include empiric prednisone, particularly if we      do not feel he would benefit from invasive biopsy.     Leslye Peer, MD  Electronically Signed    RSB/MedQ  DD: 10/10/2006  DT: 10/11/2006  Job #: [id number]   cc:   Peter M. [place]azil[place], M.D.

## 2010-06-15 NOTE — Cardiovascular Report (Signed)
NAMELAVARR, PRESIDENT              ACCOUNT NO.:  [id number]   MEDICAL RECORD NO.:  [id number]          PATIENT TYPE:  OIB   LOCATION:  1963                         FACILITY:  [facility]   PHYSICIAN:  Peter M. Swaziland, M.D.  DATE OF BIRTH:  1930/06/26   DATE OF PROCEDURE:  08/08/2006  DATE OF DISCHARGE:                            CARDIAC CATHETERIZATION   INDICATIONS FOR PROCEDURE:  A 77 year old white male presents with  symptoms of persistent dyspnea.  Subsequent cardiac evaluation showed  evidence of anterior apical ischemia.  The patient has had prior  coronary artery bypass surgery x3. He has had of tricuspid and mitral  valve annuloplasties.  He does have moderate pulmonary hypertension by  echocardiogram.   PROCEDURES:  Right and left heart catheterization, coronary and left  ventricular angiography, saphenous vein graft angiography x2 and LIMA  graft angiography.   EQUIPMENT USED:  4-French arterial sheath, 4-French pigtail catheter, 4-  French left Judkins four catheter, 4-French right Judkins four catheter,  4-French LCB catheter and a 4-French IMA catheter, 7-French venous  sheath and 7-French balloon tip Swan-Ganz catheter.   Access via the right femoral artery and vein using standard Seldinger  technique.  There were no complications.  Contrast 110 mL  of Omnipaque.   HEMODYNAMIC DATA:  Right atrial pressures 12/11 with a mean of 10 mmHg.  Right ventricular pressure 60 with EDP of 10 mmHg.  Pulmonary artery  pressures 53/17 with a mean of 34 mmHg.  Pulmonary capillary wedge  pressure is 28/39 with mean of 24 mmHg.  Aortic pressures 172/73 with a  mean of 110 mmHg.  Left ventricle pressure is 177 with EDP of 16 mmHg.  There is no aortic valve gradient.  There is a variable mitral valve  gradient with average peak of 8 mmHg, a mean of 10 mmHg.  Mitral valve  area is calculated 1.2 cm2.  The cardiac output by Fick  is 4.4 with an  index of 2.21.  By thermodilution cardiac  outputs 4.3 liters per minute  with an index of 2.16.   ANGIOGRAPHIC DATA:  The left coronary arises and distributes normally.  The left main is short and is normal.   The left anterior descending artery has an 80% proximal stenosis that is  moderately calcified.  The mid to distal vessel fills by the IMA graft.  The first and second diagonal branches are without significant disease  and are moderate in size.   Left circumflex coronary is small in caliber.  It is occluded in the  midvessel.   The right coronary artery is a large dominant vessel, has a long  segmental 80% stenosis in the proximal to mid vessel and then another  80% stenosis in the distal vessel.  There is a large right ventricular  marginal vessel which is without significant disease.   The saphenous vein graft to the distal right coronary/posterolateral  branch is widely patent with good runoff.   The LIMA graft to the LAD is widely patent with good runoff.   Saphenous vein graft to the terminal left circumflex coronary is widely  patent.  The vessel it is supplying is very small.   Left ventricular angiography performed in RAO view demonstrates mild  inferior apical hypokinesia with overall low normal left ventricular  systolic function with ejection fraction of 50%.  There is very mild  mitral insufficiency noted.   FINAL INTERPRETATION:  1. Severe three-vessel obstructive atherosclerotic coronary artery      disease.  2. All bypass grafts were patent including saphenous vein graft to the      distal right coronary artery, saphenous vein graft to the distal      circumflex and LIMA graft to the LAD.  3. Low normal left ventricular systolic function.  4. Mild mitral insufficiency.  5. Probable mild degree of mitral valve stenosis.  The patient does      have large reflected B-wave.  6. Moderate to severe pulmonary hypertension.           ______________________________  Peter M. [place]aziland, M.D.      PMJ/MEDQ  D:  08/08/2006  T:  08/08/2006  Job:  [id number]   cc:   Loraine Leriche A. Perini, M.D.

## 2010-06-15 NOTE — Procedures (Signed)
CAROTID DUPLEX EXAM   INDICATION:  Follow up known carotid artery disease.   HISTORY:  Diabetes:  No.  Cardiac:  CABG, tricuspid valve repair in 2005.  Hypertension:  Yes.  Smoking:  No.  Previous Surgery:  None.  CV History:  Amaurosis Fugax No, Paresthesias No, Hemiparesis No.                                       RIGHT             LEFT  Brachial systolic pressure:         150               148  Brachial Doppler waveforms:         Biphasic          Biphasic  Vertebral direction of flow:        Antegrade         Antegrade  DUPLEX VELOCITIES (cm/sec)  CCA peak systolic                   66                63  ECA peak systolic                   74                122  ICA peak systolic                   76                180  ICA end diastolic                   15                57  PLAQUE MORPHOLOGY:                  Heterogenous      Heterogenous  PLAQUE AMOUNT:                      Mild              Moderate  PLAQUE LOCATION:                    ICA               ICA, ECA   IMPRESSION:  1. 40-59% stenosis noted in the left internal carotid artery.  2. 1-39% stenosis noted in the right internal carotid artery.  3. Antegrade bilateral vertebral arteries.   ___________________________________________  P. Liliane Bade, M.D.   MG/MEDQ  D:  11/20/2007  T:  11/20/2007  Job:  [id number]

## 2010-06-15 NOTE — Procedures (Signed)
CAROTID DUPLEX EXAM   INDICATION:  Follow up carotid artery disease.   HISTORY:  Diabetes:  No.  Cardiac:  CABG, tricuspid valve repair in 2005.  Hypertension:  Yes.  Smoking:  No.  Previous Surgery:  No.  CV History:  No.  Amaurosis Fugax No, Paresthesias No, Hemiparesis No.                                       RIGHT             LEFT  Brachial systolic pressure:         158               148  Brachial Doppler waveforms:         WNL               WNL  Vertebral direction of flow:        Antegrade         Antegrade  DUPLEX VELOCITIES (cm/sec)  CCA peak systolic                   81                77  ECA peak systolic                   101               115  ICA peak systolic                   76                160  ICA end diastolic                   14                38  PLAQUE MORPHOLOGY:                  Heterogenous      Calcific  PLAQUE AMOUNT:                      Mild              Mild  PLAQUE LOCATION:                    ICA, ECA          ICA, bifurcation   IMPRESSION:  1. Right internal carotid artery suggests 1% to 39% stenosis.  2. Left internal carotid artery suggests 40% to 59% stenosis.  3. Unable to duplicate higher velocities from previous study of the      left internal carotid artery.  4. Antegrade flow in bilateral vertebrals.   ___________________________________________  Quita Skye Hart [place], M.D.   CB/MEDQ  D:  07/07/2009  T:  07/07/2009  Job:  [id number]

## 2010-06-15 NOTE — Procedures (Signed)
CAROTID DUPLEX EXAM   INDICATION:  Follow up carotid artery disease.   HISTORY:  Diabetes:  No.  Cardiac:  CABG, tricuspid valve repair in 2005.  Hypertension:  Yes.  Smoking:  No.  Previous Surgery:  No carotid surgeries.  CV History:  Asymptomatic.  Amaurosis Fugax No, Paresthesias No, Hemiparesis No.                                       RIGHT             LEFT  Brachial systolic pressure:         124               128  Brachial Doppler waveforms:         WNL               WNL  Vertebral direction of flow:        Antegrade         Antegrade  DUPLEX VELOCITIES (cm/sec)  CCA peak systolic                   84                76  ECA peak systolic                   118               151  ICA peak systolic                   84                223  ICA end diastolic                   21                76  PLAQUE MORPHOLOGY:                  Heterogenous      Heterogenous  PLAQUE AMOUNT:                      Mild              Moderate  PLAQUE LOCATION:                    ICA/bifurcation  ICA/ECA/bifurcation   IMPRESSION:  1. Right internal carotid artery shows evidence of 1-39% stenosis.  2. Left internal carotid artery shows evidence of 60-79% stenosis (low      end of range), an increase from previous study.   ___________________________________________  Quita Skye Hart [place], M.D.   AS/MEDQ  D:  12/02/2008  T:  12/02/2008  Job:  [id number]

## 2010-06-18 NOTE — H&P (Signed)
NAME:  [name] [name], [name] [name]                        ACCOUNT NO.:  [id number]   MEDICAL RECORD NO.:  [id number]                   PATIENT TYPE:  INP   LOCATION:  4734                                 FACILITY:  [facility]   PHYSICIAN:  Mark A. Perini, M.D.                DATE OF BIRTH:  December 06, 1930   DATE OF ADMISSION:  08/18/2003  DATE OF DISCHARGE:                                HISTORY & PHYSICAL   CHIEF COMPLAINT:  Dyspnea.   HISTORY OF PRESENT ILLNESS:  Mr. Bady is a pleasant 77 year old male who  was recently admitted for three weeks from late June until three days ago.  At that time, he had a subacute myocardial infarction.  This was followed by  three vessel coronary artery bypass grafting, mitral valve and tricuspid  repair, modified Cox-Maze procedure, pacemaker implantation during his  hospitalization.  He was discharged home three days prior to this admission.  He reports that over the last few days, he has become progressively more  short of breath and has felt terrible.  He has had very poor energy.  All he  has done is slept and sat on the chair.  He is not coughing, he has had a  little bit of sneezing.  There have been no fevers.  He denies any worsening  of chest pains or chest soreness beyond his usual postoperative chest  soreness.  He feels very weak.  He states that he did have some salt in his  diet, but not a tremendous amount over the weekend.  He presented to the  office and was found to look very weak and have room air saturation of 92%  and subsequently was found to have a BMP of 1000.  Also, chest x-ray showed  significant bilateral patchy infiltrates possibly consistent with edema  versus pneumonia.  He was subsequently admitted for further evaluation and  treatment.   PAST MEDICAL HISTORY:  1. Long-standing hypertension.  2. Hyperlipidemia.  3. Remote history of gastrointestinal bleeding in 1983 after which time some     benign polyps were removed from his  colon.  4. Gout.  5. Decreased  hearing.  6. Gastroesophageal reflux disease.  7. History of atrial fibrillation in October 2004.  8. Mild pulmonary hypertension in October 2004.  9. Benign prostatic hypertrophy.  10.      Recent history as above with coronary disease status post bypass     surgery and mitral valve and tricuspid repair.   ALLERGIES:  Codeine.   MEDICATIONS:  He has been taking Coumadin 5 mg Monday, Wednesday, Friday,  and Sunday, and 2.5 mg on Tuesday, Thursday and Saturday.  Amiodarone 400 mg  daily, Synthroid 100 mcg daily, this was recently started at the end of his  last hospitalization, Prevacid 30 mg daily, Colchicine 0.6  mg as needed,  Allopurinol 300 mg daily, Lotrel 10/20 1 daily, Atenolol 100 mg 1/2 tablet  daily, Crestor 5 mg daily, Ultram 50 mg 1-2 tablets every 4 hours as needed.  He also takes aspirin 81 mg daily.   SOCIAL HISTORY:  He has been married since 59.  He has three children and  six grandchildren.  Two years college education.  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  He quit tobacco in 1980 but had a 15-pack-year history then.  He  has normally about 21 drinks of wine per week, but none recently.  No drug  use history.   FAMILY HISTORY:  Father died at age 70 of possible stroke.  Mother died at  5 of hypertension and nature causes.  Two brothers, one initially diagnosed  with coronary disease at age 82 and subsequently passed away after a second  CABG procedure, one died of cerebral hemorrhage.  He has three children who  are healthy.   REVIEW OF SYMPTOMS:  As per the history of present illness.   PHYSICAL EXAMINATION:  VITAL SIGNS:  Weight 178 pounds which is unchanged compared to his weight  prior to his bypass surgery, 92% saturation on room air, blood pressure  88/54, pulse 69.  GENERAL:  He is in no acute distress but appeared pale and somewhat  dyspneic.  LUNGS:  Decreased breath sounds bilaterally with no significant wheezes,   rales, or rhonchi.  HEART:  Regular rate and rhythm with 1/6 murmur at the left sternal border,  no rub or gallop.  There is no peripheral edema.  ABDOMEN:  Soft, nontender.   LABORATORY DATA:  PT INR drawn at home by [facility] nurse this morning was  1.7 with a PT of 17.7.  BNP in the office today was 150.  Today, his glucose  is 91, BUN 20, creatinine 1, sodium 138, potassium 5.5, chloride 106, CO2  25, calcium 8.9, total protein 6.2, albumin 3.7, AST 13, ALT 17, alkaline  phos 112, total bilirubin 0.5, CK 25.  White count 12.9 with 71% segs, 24%  lymphocytes, and 4% monocytes.  Hemoglobin 9.8, hematocrit 28.6, MCV 90,  platelet count 102,000, which is somewhat elevated.  Chest x-ray done in the  office shows bilateral patchy infiltrates consistent with interstitial  process such as edema versus pneumonia.  Chest x-ray done at the hospital  day also showed similar findings and if these are compared to previous x-  rays from the last week or two, there is really not significant change in  the bilateral asymmetric densities noted on the x-ray.  EKG today shows dual  chamber pacing with normal pacemaker function at a heart rate of 60 beats  per minute.  Further lab work from the hospital today shows a CK MB of 0.5,  CK 15, troponin elevated at 0.19.  Urinalysis is positive for 7-10 white  cells, 0-2 red cells, mucous and a few squamous cells.  Specific gravity is  1.022, small leukocytes noted.  Troponin I at 12 o'clock today was 0.21, CK  earlier today was 20 with an MB of 0.5.  White count on July 15 was 12.3  with hemoglobin 9.1 and a platelet count 500,000 at that time.   ASSESSMENT AND PLAN:  77 year old male status post recent MI, coronary  artery bypass grafting, mitral valve and tricuspid repair, with significant  dyspnea, elevated BNP, stable asymmetric infiltrates on chest x-ray.  It is not entirely clear to what degree his symptoms and findings represent  congestive heart  failure.  He may have some other  interstitial type  lung process such as a pneumonia or other inflammatory  condition.  We will admit him to a telemetry bed.  I have asked cardiology  to see him and follow with [place].  We will also ask for a pulmonology consult  given his symptoms and findings.                                                Mark A. Waynard Edwards, M.D.    MAP/MEDQ  D:  08/18/2003  T:  08/18/2003  Job:  [id number]   cc:   Salvatore Decent. Cornelius Moras, M.D.  [address]  [place]  [place] [address]   Peter M. Sw[place]iland, M.D.  [address], [address]  [place], [place] [address]  Fax: [phone]

## 2010-06-18 NOTE — Cardiovascular Report (Signed)
NAME:  [name] [name], [name] [name]                        ACCOUNT NO.:  [id number]   MEDICAL RECORD NO.:  [id number]                   PATIENT TYPE:  INP   LOCATION:  2926                                 FACILITY:  [facility]   PHYSICIAN:  Colleen Can. Deborah Chalk, M.D.            DATE OF BIRTH:  [birth year]   DATE OF PROCEDURE:  DATE OF DISCHARGE:                              CARDIAC CATHETERIZATION   PROCEDURES PERFORMED:  1. Left heart catheterization.  2. Selective coronary angiography.  3. Left ventricular angiography.   CARDIOLOGIST:  Colleen Can. Deborah Chalk, M.D.   HISTORY:  [name] [name] is a 77 year old male who presented with congestive  heart failure.  He has underlying atrial fibrillation.   ACCESS:  Type and site of entry; percutaneous right femoral artery with  Angio-Seal.   CATHETERS:  Six French 4 curved Judkins right and left coronary catheters.  Six French pigtail ventriculographic catheter.   CONTRAST MATERIAL:  Omnipaque.   MEDICATIONS:  Medications given prior to the procedure; Valium 10 mg p.o.   Medications given during the procedure; Ancef 1 gram IV.   COMMENTS:  The patient tolerated the procedure well.   HEMODYNAMIC DATA:  The aortic pressure was 128/66.  LV pressure was 136/13-  23.  There is no aortic valve gradient noted on pullback.   ANGIOGRAPHIC DATA:  1. Right Coronary Artery:  The right coronary artery is totally occluded     proximally.  There are extensive collaterals from the left system to the     distal right coronary artery.   1. Left Main Coronary Artery:  The left main coronary artery is relatively     short and appeared to have segmental narrowing without significant focal     obstructive disease.   1. Left Anterior Descending:  The left anterior descending has extensive     calcification proximally.  There is a 70-80% proximal LAD stenosis.     There are two moderate size diagonal vessels without significant     obstructive disease present.  The left  anterior descending crosses the     apex and supplies extensive collaterals to the distal right coronary     artery.   1. Left Circumflex:  The left circumflex is a relatively small system.     There is 50-60% narrowing proximally.  There is a small obtuse marginal     that maybe bypassable, although it would be small.  It has a 70-80% focal     narrowing.   Left Ventricular Angiogram:  The left ventricular angiogram was performed in  the RAO position.  Overall cardiac size was normal.  There was 1+ mitral  regurgitation.  There was inferior severe hypokinesia noted.  The global  ejection fraction was 40%.   OVERALL IMPRESSION:  1. Severe three-vessel coronary artery disease.  2. Moderate left ventricular dysfunction with inferior hypokinesia.  3. Atrial fibrillation.   PLAN:  The patient will be referred  for coronary artery bypass grafting.   ADDENDUM:  Angio-Seal was used to close the femoral arteriotomy site.                                               Colleen Can. Deborah Chalk, M.D.    SNT/MEDQ  D:  07/30/2003  T:  07/30/2003  Job:  [id number]   cc:   Peter M. Swaziland, M.D.  [address]  [place], [place] [address]  Fax: [phone]   Redge Gainer. Waynard Edwards, M.D.  [address]  [place]  [place] [address]  Fax: [phone]

## 2010-06-18 NOTE — Consult Note (Signed)
NAME:  [name] [name], [name] [name]                        ACCOUNT NO.:  [id number]   MEDICAL RECORD NO.:  [id number]                   PATIENT TYPE:  INP   LOCATION:  2926                                 FACILITY:  [facility]   PHYSICIAN:  Colleen Can. Deborah Chalk, M.D.            DATE OF BIRTH:  1930-12-17   DATE OF CONSULTATION:  07/28/2003  DATE OF DISCHARGE:                                   CONSULTATION   HISTORY:  Mr. Massman is a 77 year old male referred to the emergency  department today with atrial fibrillation and a slow ventricular response  and questionable congestive heart failure and also to rule out ischemia.  He  has complained of dyspnea over the past several days and probably had what  was felt to be bronchitis over the last four or five weeks.  He really did  not have a cough and continued to have more coughing and shortness of breath  at night.  He was in [facility] on vacation and subsequently for the past two  weeks has been in [place] on vacation and really had a good bit of  shortness of breath and chest tightness with lack of energy.  He really did  not have any frank severe exertional symptoms or chest pain.  He saw Dr.  Waynard Edwards for persistence of symptoms and referred to the emergency room for  evaluation.   He has a history of prior atrial fibrillation dating back to October 2004.  Had a negative Cardiolite at that point in time.  He had a history of an  echo then, which showed mild LVH, mild bi-atrial enlargement, mild mitral  and tricuspid insufficiency, and pulmonary hypertension.  He has a history  of longstanding hypertension, hypercholesterolemia, gastroesophageal reflux,  a remote history of pleurisy, and a history of cataract surgery, history of  gout, and prior GI bleed in 1983.   ALLERGIES:  None.   CURRENT MEDICINES:  Coumadin, Crestor, multivitamin, Advair, atenolol, and  Maxzide.  He was also on allopurinol, Colchicine, Lotrel, and Catapres.   FAMILY  HISTORY:  Father died at age 61 of a CVA.  Mother died at age 20.   SOCIAL HISTORY:  No smoking in over 30 years.  He drinks about two glasses  of wine a day.  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   REVIEW OF SYSTEMS:  Basically unremarkable.  HEENT:  Unremarkable.  PULMONARY:  Negative, no cough, no hemoptysis.  GASTROINTESTINAL:  Negative.  GENITOURINARY:  Unremarkable.  Had some decreased stream while on Claritin,  but that is improved.  There is no significant nocturia.  There is no  significant arthritic complaint.   PHYSICAL EXAMINATION:  GENERAL:  He is in no acute distress.  VITAL SIGNS:  Blood pressure is 123/45, heart rate is in the 40s and  regular.  It is a nodal rhythm.  SKIN:  Warm and dry, color is normal.  CHEST:  Lungs are fairly clear.  CARDIAC:  A slow, regular rhythm, no murmur.  ABDOMEN:  Negative, no masses, normal bowel sounds.  EXTREMITIES:  Without edema.  NEUROLOGIC:  He is intact.   Hematocrit is 39, white count is 11,000, platelets 424,000.  BUN is 36,  creatinine 1.4, glucose of 115.  CK was 156 with MB of 8.2, troponin is  9.54.  B-peptide is 605.  EKG is junctional with diffuse ST and T-wave  changes.  Chest x-ray shows bilateral lung densities, questionable acute  versus chronic.   OVERALL IMPRESSION:  1. Recurrent atrial fibrillation.  2. Probable recent myocardial infarction.  3. Bradyarrhythmias.  4. Hypertension.  5. Hypercholesterolemia.  6. Dyspnea, questionable heart failure versus pulmonary symptoms.   PLAN:  1. Will hold the atenolol and Coumadin.  2. Will plan cardiac catheterization if the INR is less than 2.  3. Will change him to heparin anticoagulation, add topical nitrates.  4. If slow rate continues he may need a permanent pacemaker, but we need to     evaluate this all in the setting of ischemic heart disease.                                                Colleen Can. Deborah Chalk, M.D.    SNT/MEDQ  D:  07/28/2003  T:  07/29/2003  Job:  [id number]   cc:   Lanier Ensign, M.D.  Fax: [phone]

## 2010-06-18 NOTE — Discharge Summary (Signed)
NAME:  [name] [name], [name] [name]                        ACCOUNT NO.:  [id number]   MEDICAL RECORD NO.:  [id number]                   PATIENT TYPE:  INP   LOCATION:  2015                                 FACILITY:  [facility]   PHYSICIAN:  Salvatore Decent. Cornelius Moras, M.D.              DATE OF BIRTH:  [birth year]   DATE OF ADMISSION:  07/28/2003  DATE OF DISCHARGE:  08/15/2003                                 DISCHARGE SUMMARY   ADMISSION DIAGNOSIS:  Shortness of breath, weakness, and chest pain.   PAST MEDICAL HISTORY/DISCHARGE DIAGNOSES:  1. Hypertension.  2. Hyperlipidemia.  3. History of cataract removal with lens implant.  4. History of gastrointestinal in approximately 1983.  Colonoscopy revealed     benign polyps.  5. Gout.  6. Gastroesophageal reflux disease.  7. Chronic, persistent atrial fibrillation.  The patient is on Coumadin     therapy and is status post modified Cox Maze procedure.  8. Mild pulmonary hypertension by previous echocardiogram.  9. Benign prostatic hypertrophy.  10.      Severe three-vessel coronary artery disease, status post coronary     artery bypass graft x3.  11.      Moderate mitral regurgitation, status post mitral valve     annuloplasty.  12.      Chronic interstitial lung disease, pulmonary fibrosis.  13.      Moderate tricuspid regurgitation, status post tricuspid     annuloplasty.   ALLERGIES:  CODEINE.   BRIEF HISTORY:  Patient is a 77 year old male followed by Dr. Rodrigo Ran  with a known history of hypertension and atrial fibrillation.  He was  admitted to [facility] via the emergency department with symptoms  of congestive heart failure and atrial fibrillation on July 28, 2003.  He  was noted to have a bradycardia with atrial fibrillation and a very slow  ventricular response as well as episodes of second-degree AV block with  intermittent sinus bradycardia.  Patient underwent a cardiac cath by Dr.  Deborah Chalk, which revealed three-vessel coronary  artery disease and moderate  left ventricular dysfunction.  Echocardiogram demonstrated moderate left  ventricular dysfunction and moderate mitral regurgitation.  CVTS was  subsequently consulted regarding surgical revascularization as well as  repair of the mitral regurgitation.  Dr. Donata Clay as well as Dr. Cornelius Moras saw  the patient and felt that he was a candidate for surgical intervention.  The  patient's baseline chest x-ray showed evidence that suggested a chronic  interstitial lung disease.  A chest CT was also performed, which confirmed  the presence of pulmonary fibrosis.  Pulmonary function tests were performed  and were acceptable, with only mild function impairment; therefore, the  patient was scheduled for surgery.   HOSPITAL COURSE:  Patient was admitted to [facility] on August 27, 2003 by Dr. Waynard Edwards for shortness of breath and chest pain.  Patient was  evaluated by the cardiology service at  that time and was diagnosed with a  recent myocardial infarction and junctional rhythm.  Patient was  subsequently scheduled for cardiac cath.  He underwent cardiac  catheterization on July 30, 2003 by Dr. Deborah Chalk.  This revealed three-vessel  coronary artery disease, moderate mitral regurgitation.  Dr. Donata Clay of  CVTS was subsequently consulted, and the patient was found to be a candidate  for surgical revascularization.  A 2D echocardiogram was performed on July 31, 2003 and revealed moderate mitral regurgitation.  Patient was maintained  on routine hospital care prior to surgery.  A chest CT revealed bilateral  pulmonary fibrosis, greater in the upper than lower lobes.  There was no  evidence of cancer present and no significantly enlarged mediastinal nodes.   Patient was taken to the OR on August 06, 2003 for a coronary artery bypass  grafting x3, mitral valve repair, tricuspid valve repair, and modified Cox  Maze procedure.  The left internal mammary artery was grafted to the  LAD.  Saphenous vein was grafted to the RPL and saphenous vein was grafted to the  OM.  Mitral valve repair was performed with a 26 mm McCarthy-Edwards  annuloplasty ring.  Tricuspid valve repair was performed with a 30 mm MC3  annuloplasty ring.  An open lung biopsy of the left upper lobe was also  performed at the time of surgery.  The patient tolerated the procedure well  and was hemodynamically stable immediately postoperatively.  The patient was  transferred from the OR to the SICU in stable condition.  No blood products  were given during the procedure.  The patient was extubated without  complications and woke up from anesthesia neurologically intact.   On postoperative day #1, the patient was doing quite well.  He was afebrile,  and the vital signs were stable.  His underlying rhythm was sinus  bradycardia with a rate of approximately 40.  He did show a first-degree AV  block and rate-related second-degree AV block.  His physical exam was within  normal limits.  The patient was mobilized, and all tubes and lines were  discontinued.   Patient began cardiac rehab on postoperative day #2 and tolerated this  relatively well.  Due to the patient's persistent sinus brady and second-  degree AV block preoperatively and postoperatively, he planned to have a  permanent pacemaker placed.  The lung biopsy was consistent with  interstitial fibrosis.  The patient continued to progress as expected  postoperatively.  The patient had a DDD pacer placed on August 12, 2003.  The  patient tolerated this well.  The patient continued to progress as expected.  The patient was subsequently started on Coumadin after the pacer was placed.  The patient continued to ambulate with cardiac rehab and continued to  improve.   On postoperative day #9, the patient was without complaint and was afebrile  with stable vital signs.  He was maintained on a normal sinus rhythm.  His physical exam was unremarkable.   The patient's TSH level was noted to be  slightly increased on August 15, 2003; therefore, the patient was started on  Synthroid empirically.  A T3/T4 was checked.  The patient was felt to be  stable for discharge at that time, and his Coumadin dose could be brought to  therapeutic level and alternated as an outpatient.   CONDITION ON DISCHARGE:  Improved.   LABS:  CBC on July 15th:  White count 12.3, hemoglobin 9.1, hematocrit 26.6,  platelets  500.  PT/INR on July 15th was 16.8 and 1.5.  BNP on August 14, 2003:  Sodium 132, potassium 4.4.  BUN 17, creatinine 1.1, glucose 109.  Total  bilirubin 0.4, alk phos 82, SGOT 15, SGPT 18, total protein 5.5, albumin  2.6, calcium 8.4.  Free T4 on July 15th was 0.96.  TSH on July 14th was  6.929.  T3 on July 15th was 73.2.   DISCHARGE MEDICATIONS:  1. Aspirin 81 mg q.d.  2. Crestor 5 mg q.d.  3. Coumadin 5 mg Monday, Wednesday, Friday, Sunday, alternating with 2.5 mg     Tuesday, Thursday, Saturday.  4. Amiodarone 400 mg q.d.  5. Synthroid 100 mcg q.d.  6. Prevacid 30 mg q.d.  7. Colchicine 0.6 mg p.r.n.  8. Allopurinol 300 mg q.d.  9. Lotrel 10/20 mg q.d.  10.      Atenolol 100 mg 1/2 tab q.d.  11.      Pain management:  Ultram 50 mg p.o. 1-2 q.4-6h. p.r.n. pain.   ACTIVITY:  No driving.  No lifting more than 10 pounds.  Patient is to  continue to ambulate with walking exercises.   DIET:  Low salt, low fat.   WOUND CARE:  The patient may shower daily and clean the incisions with soap  and water.  If the incisions become red, swollen, or drainage, or if the  patient develops a fever of 101 degrees Fahrenheit, he is to call the CVTS  office at [phone].   PT/INR blood work are to be drawn on August 18, 2003 by the home health nurse  and called to Dr. Elvis Coil office.   FOLLOW UP:  1. Patient is to call Dr. Elvis Coil office for a follow-up appointment two     weeks after discharge with a chest x-ray and EKG.  2. Patient has an appointment  with Dr. Cornelius Moras on September 01, 2003 at 1:45 p.m.      Pecola Leisure, PA                      Salvatore Decent. Cornelius Moras, M.D.    AY/MEDQ  D:  08/15/2003  T:  08/16/2003  Job:  [id number]   cc:   Loraine Leriche A. Waynard Edwards, M.D.  [address]  [place]  [place] [address]  Fax: [phone]   Dr. Swaziland

## 2010-06-18 NOTE — H&P (Signed)
NAME:  [name] [name], [name] [name]                        ACCOUNT NO.:  [id number]   MEDICAL RECORD NO.:  [id number]                   PATIENT TYPE:  INP   LOCATION:  2926                                 FACILITY:  [facility]   PHYSICIAN:  Mark A. Perini, M.D.                DATE OF BIRTH:  [birth year]   DATE OF ADMISSION:  07/28/2003  DATE OF DISCHARGE:                                HISTORY & PHYSICAL   CARDIOLOGY CONSULTANT:  Colleen Can. Deborah Chalk, M.D.   CHIEF COMPLAINT:  Shortness of breath and feeling weak, and pleuritic chest  pain.   HISTORY OF PRESENT ILLNESS:  [name] [name] is a 77 year old male with a  history of longstanding hypertension, hyperlipidemia, gout, gastroesophageal  reflux disease, and atrial fibrillation on October 2004, which spontaneously  reverted back to normal sinus rhythm on its own.  He had an exercise  Cardiolite in October 2004 showing no ischemia and an ejection fraction of  72%.  He has mild pulmonary hypertension from a previous echocardiogram in  November 2003, and history of mild left ventricular hypertrophy and biatrial  enlargement.  [name] [name] was seen on Jul 01, 2003.  At that time he had a  right elbow olecranon bursa effusion.  He also had his gout medicine  increased.  He also had some cough and nasal drainage, and some wheezing and  dyspnea on exertion.  At that time he was felt to have a probable viral  bronchitis.  He was treated with Mucinex and Advair, and an azithromycin Tri  Pak as well as Nasonex and Tylenol.  After that he went on a trip to [facility] where he continued to feel sick and have cough.  He was seen by a  doctor in [facility] and given Avelox for seven days, and kept on his Advair.  He was seen back in the office on June 07th and was felt to be resolving his  bronchitis.  Peak flows were 300 liters/minute.  He was encouraged to keep  on the Advair until his symptoms cleared and to back off on the Nasonex as  his nasal drainage had  subsided.  He then went on a trip to [place] [employer] for  two weeks.  Approximately one week prior to admission he had more  significant chest pain, tightness and weakness, and he felt sick during his  entire stay at [facility].  He also developed pain with deep breathing or  coughing in his chest.  He presented back to our office today after  returning home two days ago.  In the office he was found to be hypotensive  with a blood pressure of 90/60 and bradycardic with a pulse of 39-44 beats a  minute.  Furthermore, he has slow atrial fibrillation on EKG and was noted  to have inferior Q waves on his EKG as well.  His lungs revealed bronchial  breath sounds.  Given his constellation of symptoms he is admitted to the  hospital.   Cardiac enzymes reveal troponin elevated at 9.4 and an MB fraction that is  elevated.  He also has an elevated BNP of 605.  He most likely has had a  subacute myocardial infarction and will require full evaluation.   PAST MEDICAL HISTORY:  1. Longstanding hypertension.  2. Hyperlipidemia.  3. History of cataract removal with lens implant.  4. History of GI bleeding in about 1983.  Colonoscopy revealed benign     polyps.  5. Gout.  6. Decreased hearing.  7. Gastroesophageal reflux disease.  8. Atrial fibrillation in October 2004, which reverted to normal sinus     rhythm on its on, but he was maintained on Coumadin therapy thereafter.  9. Mild pulmonary hypertension by previous echocardiogram.  10.      Benign prostatic hypertrophy.   ALLERGIES:  CODEINE.   MEDICATIONS:  1. Colchicine 0.6 mg as needed.  2. Lotrel 10/20 1 tablet daily.  3. Prevacid 30 mg daily.  4. Allopurinol 300 mg daily.  5. Fish oil tablet daily.  6. Coumadin; most recent dose was 5 mg once daily.  7. Atenolol 100 mg 1/2 tablet daily.  8. Crestor 1/2 tablet daily.  9. Multivitamin daily.  10.      Advair 100/50 1 puff twice daily; he stopped this two days ago     because he did not  feel that it was helping him.   SOCIAL HISTORY:  [name] [name] is married since 79.  His wife's name is [name] [name].  He has three children and six grandchildren.  He has two years of college  education.  He has worked for a [employer] rights [employer].  He had a 15  pack/year smoking history, but quit in 1980.  He drinks about three glasses  of wine daily.  No drug use history.   FAMILY HISTORY:  Father died at age 34 of a possible stroke.  He was  bedridden at the time.  Mother died at 13 of hypertension and natural  causes.  He had two brothers; one died of coronary disease and had two ABGs  procedures before his death; and, one died of cerebral hemorrhage.  His  three children are healthy.   PHYSICAL EXAMINATION:  VITAL SIGNS:  Blood pressure 98.58 and pulse 44.  He  has an oxygen saturation of 99% on room air.  Weight is 178 pounds, which is  down 6 pounds from his previous weight.  Peak flows range from 200-240.  GENERAL APPEARANCE:  The patient looks weak and a little bit pale.  HEENT:  There is no icterus.  No pallor.  NECK:  There is no JVD.  No bruits noted.  LUNGS:  Lungs reveal bronchial breath sounds bilaterally.  He also has some  pleuritic right0sided chest pain with deep breathing.  HEART:  Heart is bradycardiac, but no murmur, rub or gallop is noted.  There  is no peripheral edema.  ABDOMEN:  Abdomen is soft and nontender with no mass or hepatosplenomegaly.  Normoactive bowel sounds.  NEUROLOGIC EXAMINATION:  Cranial nerves are intact.  He is oriented to  person and place; and, neurologically grossly intact.   LABORATORY DATA:  Blood cultures times two have been sent and are pending.  PTT is 48, INR is 1.9.  White count is 11.4 with 58% segs, 27% lymphocytes  and 12% monocytes, hemoglobin 13.0, and platelet count 424,000.  Sodium is  137, potassium 4.1,  chloride 106, CO2 24, BUN 36, creatinine 1.4, and glucose 115.  Total bili 0.8, alk phos 72, AST 44, ALT 33, total protein  6.9,  albumin 3.3, and calcium 8.9.  BNP was 605 pcg/ml.  CK is 156 with an  MB of 8.2 with a relative index of 5.3, and again troponin I is 9.54 ng/ml.  EKG from the office shows slow atrial fibrillation with a rate of 42 beats a  minute, with inferior Q waves that are new.  No definite evidence of  hypertrophy.  Normal R wave and slightly delayed R wave progression.  No  significant ST or T wave changes, although he does have some _________ of  the T waves in the lateral leads.  Chest x-ray in the office shows bilateral  patchy infiltrates, most likely consistent with pulmonary edema.   ASSESSMENT AND PLAN:  [age]-year-old male with a subacute myocardial  infarction with slow atrial fibrillation and congestive heart failure.   The patient is admitted to the cardiac care unit and we have asked  cardiology for consultation; and, we appreciate their help.   1. We will treat him with heparin and hold his Coumadin for otherwise.  2. We will place him on an aspirin daily.  3. We will prophylax against stress ulcers with Protonix.  4. We will continue his Crestor therapy.  5. We will hold his beta blocker given his bradycardia.  6. We will continue him on Advair and Xopenex given his apparent     bronchospasm.  7. We will give him one dose of Lasix 40 mg IV on admission.  8. We will continue his allopurinol for gout.  9. He is a Full Code Status.   PROGNOSIS:  Prognosis is guarded at this time.                                                Mark A. Waynard Edwards, M.D.    MAP/MEDQ  D:  07/28/2003  T:  07/28/2003  Job:  [id number]   cc:   Colleen Can. Deborah Chalk, M.D.  Fax: [phone]

## 2010-06-18 NOTE — Discharge Summary (Signed)
NAME:  [name] [name], [name] [name]                        ACCOUNT NO.:  [id number]   MEDICAL RECORD NO.:  [id number]                   PATIENT TYPE:  INP   LOCATION:  4734                                 FACILITY:  [facility]   PHYSICIAN:  Mark A. Perini, M.D.                DATE OF BIRTH:  1930/04/19   DATE OF ADMISSION:  08/18/2003  DATE OF DISCHARGE:  08/21/2003                                 DISCHARGE SUMMARY   DISCHARGE DIAGNOSES:  1. Atherosclerotic coronary disease, status post recent bypass procedure.  2. Bradycardia, status post recent permanent pacemaker placement.  3. Status post recent mitral valve and tricuspid valve repair and Cox-Mays     procedure.  4. Congestive heart failure with exacerbation this admission, improved with     diuresis.  5. Interstitial lung disease, felt to be consistent with usual interstitial     pneumonia and pulmonary fibrosis, on treatment with steroid taper     currently.  6. Hyperlipidemia.  7. Hypertension.  8. Gout.  9. Gastroesophageal reflux disease.  10.      Atrial fibrillation around the time of his myocardial infarction     and bypass surgery, now with pacemaker placement.  Latest EKG shows dual     chamber pacing, no definite atrial flutter waves are noted.  11.      Mild pulmonary hypertension.  12.      Benign prostatic hypertrophy.  13.      Decreased hearing.   PROCEDURES:  1. CT scan of the chest with PE protocol done on August 19, 2003, which showed     bilateral patchy lung disease with appearance that can be seen in BOOP     which included ground-glass opacities in both upper lobes compatible with     some active inflammation, moderate sized bilateral pleural effusions, and     mild mediastinal and right hilar adenopathy, but no pulmonary emboli were     seen.  2. Cardiology and pulmonology consultation.   DISCHARGE MEDICATIONS:  1. Lotensin 20 mg daily.  2. Prednisone taper 40 mg x4 days, 30 mg x4 days, then 20 mg thereafter  until further directed by Dr. Delford Field.  3. Coumadin 5 mg Thursdays, Saturdays, Mondays and Tuesdays, 2.5 mg Fridays,     Sundays, Wednesdays.  4. He is to stop Amiodarone.  5. Synthroid 100 mcg daily.  6. Prevacid 30 mg daily with food.  7. Colchicine 0.6 mg daily.  8. Allopurinol 300 mg daily.  9. Atenolol 100 mg 1/2 pill daily with food.  10.      Crestor 5 mg daily.  11.      Aspirin 81 mg daily.  12.      Multivitamin daily.  13.      Fish oil tablet daily.  14.      Xopenex 0.633 mg nebulized t.i.d.  15.      Lasix 40 mg daily.  16.      He is to stop Lotrel.  17.      Norvasc 2.5 mg daily.  18.      Potassium chloride 20 mEq daily.  19.      Tylenol p.r.n.   HISTORY OF PRESENT ILLNESS:  [name] [name] is a pleasant 77 year old male who  recently had a hospital stay for subacute myocardial infarction.  At that  time, he underwent three vessel bypass surgery, mitral valve and tricuspid  valve repair, permanent pacemaker implantation, and modified Cox-Mays  procedure.  He was discharged home three days prior to this admission.  Over  that period of time, he developed some progressive shortness of breath and  very poor energy and lethargy.  In the office, he appeared pale and very  weak, and oxygen saturations were 92% on room air, and he was found to have  a BNP of 1000.  He was therefore admitted for further evaluation and  treatment.   HOSPITAL COURSE:  [name] [name] was admitted to a telemetry bed.  He was  diuresed gently with intravenous and then oral Lasix.  He responded well to  this with reduction in his weight and good urine output.  BNP came down to  the 400 to 500 range at the time of discharge.  Also, he had significant  infiltrates on x-ray, and he had a previous lung biopsy which showed usual  interstitial pneumonia.  Pulmonology was consulted and felt that he did have  pulmonary fibrosis and some interstitial active lung disease.  He was  treated for four days with  azithromycin during this hospital stay  empirically.  He was also placed on 40 mg daily of prednisone.  With these  measures, he did have improvement in his shortness of breath.  At the time  of discharge, his oxygen saturations were 94 to 95% on room air even with  some ambulation in the hall.  It was therefore determined that he would not  need home oxygen.  His blood pressure remained stable and vital signs  remained stable and he had a paced rhythm during his entire stay.  By August 21, 2003, he was deemed stable for discharge home.   DISCHARGE PHYSICAL EXAMINATION:  VITAL SIGNS:  Afebrile, temperature 97.5,  pulse 60, respiratory rate 21, blood pressure 111/57, 95% saturation on room  air.  GENERAL:  He was in no acute distress.  LUNGS:  Coarse breath sounds bilaterally, but no significant wheezes,  rhonchi, or rales.  HEART:  Regular rate and rhythm with a 1 to 2/6 murmur at the left sternal  border.  ABDOMEN:  Soft and nontender.  There was no peripheral edema.  The patient  did have mild pallor.   DISCHARGE LABORATORY DATA:  White count 12.7, with 74% segs, 19%  lymphocytes, 7% monocytes.  Hemoglobin 8.8, platelet count 612,000.  BNP was  518.  INR 2.2.  Sodium 131, potassium 4.1, chloride 100, CO2 22, BUN 23,  creatinine 1.3, glucose 116, calcium 8.5.   DIET:  [name] [name] is to follow a low salt diet.  He is to limit alcohol  intake.   ACTIVITY:  He is to be up as tolerated.   He is to record his weight daily and keep a record of this.  He is to  monitor his blood pressure as well.  He is to call if he has any worsening  shortness of breath or chest pain problems or other problems.  FOLLOWUP:  1. He is to keep his follow up visit with Dr. Delford Field on September 09, 2003, at     2:30 p.m.  2. He is to call our office for a follow up visit in two weeks.  3. He is to come to our lab in three days for a PT, INR, CBC, diff, and     BMET. 4. He is to keep his follow up visit with  Dr. [place].                                                Mark A. Waynard Edwards, M.D.    MAP/MEDQ  D:  08/21/2003  T:  08/22/2003  Job:  [id number]   cc:   Peter M. [place], M.D.  [address], [address]  [place], [place] [address]  Fax: [phone]   Salvatore Decent. Cornelius Moras, M.D.  [address]  [place]  [place] [address]   Shan Levans, M.D. [facility]

## 2010-06-18 NOTE — Op Note (Signed)
NAME:  [name] [name], [name] [name]                        ACCOUNT NO.:  [id number]   MEDICAL RECORD NO.:  [id number]                   PATIENT TYPE:  INP   LOCATION:  2015                                 FACILITY:  [facility]   PHYSICIAN:  Colleen Can. Deborah Chalk, M.D.            DATE OF BIRTH:  03-08-1930   DATE OF PROCEDURE:  08/12/2003  DATE OF DISCHARGE:                                 OPERATIVE REPORT   PROCEDURE:  Implantation of a dual-chamber pulse generator system with  atrial and ventricular lead systems under fluoroscopy.   SURGEONS:  1. Rosine Abe, M.D.  2. Colleen Can. Deborah Chalk, M.D.   PROCEDURE:  The right subclavicular area was prepped and draped.  The area  was infiltrated with 1% Xylocaine.  A subcutaneous pocket was created to the  prepectoral fascia.  Two punctures were made into the axillary vein over top  of the 1st rib.  Guidant wires were introduced.  Using 7-[facility]  introducers, the atrial and ventricular leads were introduced.  The  ventricular lead was a Guidant model 4470 ventricular active-fixation lead,  serial [id number].  The ventricular thresholds were 0.3 volts to capture  at 0.3-mA current with a 0.5-msec pulse width.  Impedance was 736 ohms and R  waves were 11 mV.   The atrial lead was a 4469 active-fixation lead, Guidant model [id number].  The following thresholds were recorded in the atrium:  The atrium had 0.4  volts to capture at 1.2-mA current with a 0.5-msec pulse width.  Impedance  was 430 ohms and P waves were 0.8 mV.  Both leads were sutured in place.  The wound was flushed with kanamycin solution.  The leads were connected to  a Medtronic DDDR ETDRO-1 EnPulse pacemaker, serial #[id number].  The unit  was sutured in place.  The wound was closed with 2-0 and subsequently 5-0  Vicryl.  Steri-Strips were applied.  The patient tolerated the procedure  well.                                               Colleen Can. Deborah Chalk, M.D.    SNT/MEDQ  D:   08/12/2003  T:  08/13/2003  Job:  [id number]   cc:   Elmore Guise., M.D.  Fax: [phone]   Colleen Can. Deborah Chalk, M.D.  Fax: [phone]

## 2010-06-18 NOTE — Consult Note (Signed)
NAME:  [name] [name], [name] [name]                        ACCOUNT NO.:  [id number]   MEDICAL RECORD NO.:  [id number]                   PATIENT TYPE:  INP   LOCATION:  4734                                 FACILITY:  [facility]   PHYSICIAN:  Mark A. Perini, M.D.                DATE OF BIRTH:  [birth year]   DATE OF CONSULTATION:  08/18/2003  DATE OF DISCHARGE:                                   CONSULTATION   HISTORY OF PRESENT ILLNESS:  A 77 year old white male with past medical  history of coronary artery disease (status post recent CABG), postop atrial  fibrillation, sick sinus syndrome (status post dual-chamber pacemaker  placement), mitral valve repair, questionable history of pulmonary fibrosis,  and dyslipidemia,. Presented to primary physician's office with a 24-hour  history of increasing dyspnea.  The patient reports just left the hospital  on Friday after recovery from his CABG surgery.  His hospitalization was  complicated by postop atrial fibrillation  and waiting for his INR to become  therapeutic.  During his hospitalization at that time, he also had dual-  chamber pacemaker placed without difficulty, and he was going in and out of  atrial fibrillation prior to his pacemaker placement.  After discharge on  Friday, the patient reports increasing dyspnea.  He describes dyspnea as  occurring primarily at rest.  Also notes lethargy and fatigue.  No  significant orthopnea, PND, chest pain, or palpitations.  He reports  compliance with all of his medications, otherwise reports occasional cough.  All other review of systems are negative.   MEDICATIONS:  Include Coumadin, amiodarone, atenolol, aspirin, Crestor,  allopurinol, Lotrel, colchicine, Synthroid.   ALLERGIES:  CODEINE.   FAMILY HISTORY:  Noncontributory.   SOCIAL HISTORY:  He is married.  Remote history of tobacco use, quit 45  years ago.  No significant alcohol use.   PHYSICAL EXAMINATION:  VITAL SIGNS:  Blood pressure 100/50, heart  rate in  the 70s, O2 saturation 93% on room air.  GENERAL:  He is an elderly white male, alert and oriented x 4, in no acute  distress.  NECK:  Supple.  No lymphadenopathy, 2+ carotids, no JVD.  LUNGS:  Decreased breath sounds in the bases, left greater than right, with  crackles noted in the left base.  HEART:  Regular.  Normal S1, S2.  No significant murmurs, gallops, or rubs.  ABDOMEN:  Soft, nontender, nondistended.  EXTREMITIES:  Warm with 2+ pulses and no edema.   SIGNIFICANT LABORATORY DATA:  CPK 20, MB 0.5, troponin 0.21.  BNP at the  office was greater than 1000.  INR 1.7.  CBC and BMP are currently  unavailable.   Telemetry shows A-V sequential pacing.   IMPRESSION:  Dyspnea with evidence of mild congestive heart failure  exacerbation.   PLAN:  From cardiovascular standpoint, the patient has mild volume excess by  exam and by laboratory data.  Recommend gentle diuresis with  Lasix 20 mg x 1  IV dose, then starting p.o. diuretic as patient tolerates.  Maximizing his  pulmonary status should help significantly with his sensation of dyspnea.  Will follow up in a.m. for further evaluation.     Elmore Guise., M.D.                 [facility] G[facility] Waynard Edwards, M.D.    TWK/MEDQ  D:  08/18/2003  T:  08/18/2003  Job:  [id number]

## 2010-06-18 NOTE — Procedures (Signed)
CAROTID DUPLEX EXAM   INDICATION:  Carotid artery disease   HISTORY:  Diabetes:  no  Cardiac:  CABG  Hypertension:  yes  Smoking:  no  Previous Surgery:  no  CV History:  Currently asymptomatic  Amaurosis Fugax No, Paresthesias No, Hemiparesis No                                       RIGHT             LEFT  Brachial systolic pressure:         150               144  Brachial Doppler waveforms:         normal            normal  Vertebral direction of flow:        Antegrade         Antegrade  DUPLEX VELOCITIES (cm/sec)  CCA peak systolic                   71                59  ECA peak systolic                   92                147  ICA peak systolic                   71                181  ICA end diastolic                   11                51  PLAQUE MORPHOLOGY:                  heterogenous      heterogenous  PLAQUE AMOUNT:                      mild              Moderate  PLAQUE LOCATION:                    ICA / ECA         ICA   IMPRESSION:  1. No hemodynamically significant stenosis of the right internal      carotid artery with mild plaque formations as described above.  2. Doppler velocities suggest 40% to 59% stenosis of the left proximal      internal carotid artery.  3. No significant change noted when compared to the previous exam on      07/07/2009.   ___________________________________________  Quita Skye. Hart Rochester, M.D.   CH/MEDQ  D:  01/07/2010  T:  01/07/2010  Job:  [id number]

## 2010-06-18 NOTE — Op Note (Signed)
NAME:  PERRY, BRUCATO                        ACCOUNT NO.:  [id number]   MEDICAL RECORD NO.:  [id number]                   PATIENT TYPE:  INP   LOCATION:  2309                                 FACILITY:  [facility]   PHYSICIAN:  Guadalupe Maple, M.D.               DATE OF BIRTH:  1930/07/13   DATE OF PROCEDURE:  08/06/2003  DATE OF DISCHARGE:                                 OPERATIVE REPORT   PROCEDURE:  Interoperative transesophageal echocardiogram.   Mr. Manual Novacek is a 77 year old white male who had suffered a previous  inferior wall myocardial infarction and was noted to have significant mitral  regurgitation and tricuspid regurgitation.  He is scheduled to undergo  coronary artery bypass grafting.  Interoperative transesophageal  echocardiography was requested to evaluate the mitral valve and tricuspid  valve and to assess the left ventricular function.   The patient was brought to the operating room at [facility] and  general anesthesia was induced without difficulty.  The trachea was  intubated without difficulty.  The transesophageal echocardiography probe  was inserted into the esophagus without difficulties.   PRE-BYPASS FINDINGS:  1. Mitral valve:  There was 2-3 mitral regurgitation.  The valve leaflets     appeared structurally normal.  There was dilation of the mitral annulus     and a central jet of mitral regurgitation.  There was no prolapse of the     mitral leaflets and no evidence of ruptured chordee.  The leaflets     fluttered in diastole because the patient was in atrial fibrillation.     Pulse wave Doppler interrogation of the left and right upper pulmonary     veins showed a blunted forward flow in systole, but no reversal of flow.   1. The tricuspid valve showed a dominant tricuspid annulus which appeared to     be 3+ tricuspid insufficiency.  The tricuspid leaflets did not appear to     prolapse.   1. The aortic valve showed the leaflets were  mildly thickened but coapted     well.  There was no significant calcification of the aortic valve.  The     valve opened normally and there was no aortic insufficiency.   1. Left ventricular function showed ejection fraction estimated at 35%.     There was akinesis of the inferior wall and the left ventricular cavity     was dilated.  The anterior wall, anterior septum, and lateral walls were     mildly hypokinetic.  The left ventricular wall thickness and the anterior     wall in the mid papillary level and in diastole showed 1.1 cm in     thickness.  There was no apical thrombus noted in the left ventricular     cavity.   1. The left atrium was dilated but there was no thrombus noted in the left     atrium  or left atrial appendage, but there was spontaneous echo contrast     noted in the left atrium.   1. The inner atrial septum was intact without evidence of patent foramen     ovale or atrial septal defect.   1. The right atrium was enlarged.   1. The right ventricle was enlarged and there was decreased contractility of     the right ventricular free wall but no flattening of the intraventricular     septum.   1. The ascending aorta showed mild calcification but no mobile atheromata     noted.   1. The descending aorta showed mild atheromatous disease.   POST BYPASS FINDINGS:  1. There was evidence of an annuloplasty ring in the mitral position.  The     reduced mobility of the posterior leaflet with a trap door appearance of     the anterior leaflet characteristic of an annuloplasty ring.  There was     no mitral insufficiency noted.   1. The tricuspid valve showed evidence of an annuloplasty ring.  There was     acoustic shadowing of the ring so that color Doppler interrogation of the     valve was somewhat difficult but there appeared to be no more than 1+     tricuspid insufficiency.   1. The left ventricular size appeared decreased.  There was residual     akinesis  of the inferior wall.  There was dyssynergy of the ventricular     contractility due to ventricular pacing.  The ejection fraction appeared     improved and it was estimated to be 40-45%.  The anterior wall, lateral     wall, and anterior septum were contracting well.   1. The right ventricular size was reduced and there appeared to be improved     contractility of the right ventricular free wall.   1. The aortic valve appeared unchanged from the pre-bypass study.  There was     good opening of the aortic leaflets and no aortic insufficiency.                                               Guadalupe Maple, M.D.    DCJ/MEDQ  D:  08/06/2003  T:  08/06/2003  Job:  [id number]

## 2010-06-18 NOTE — Consult Note (Signed)
NAME:  [name] [name], [name] [name]                        ACCOUNT NO.:  [id number]   MEDICAL RECORD NO.:  [id number]                   PATIENT TYPE:  INP   LOCATION:  4734                                 FACILITY:  [facility]   PHYSICIAN:  Shan Levans, M.D. [facility]            DATE OF BIRTH:  [birth year]   DATE OF CONSULTATION:  08/19/2003  DATE OF DISCHARGE:                                   CONSULTATION   HISTORY OF PRESENT ILLNESS:  [name] is a 77 year old white male  admitted for hypoxic respiratory failure.  He was originally admitted in  June with an acute myocardial infarction.  He subsequently underwent bypass  surgery.  During this procedure, he had an open lung biopsy because of  pulmonary fibrotic changes.  That showed the usual interstitial pneumonitis  with in situ thrombi.  He has had weakness.  He has had postoperative  soreness.  He has had on fever.  He has had a dry cough, no chest  congestion.  He has had a failure-to-thrive status.  He was brought to the  office on August 18, 2003 after being discharged on August 15, 2003, and found  to have saturations 92% on room air, a BNP of 1000.  Chest x-ray showed  bilateral patchy infiltrates worsening.  On this basis, he was readmitted to  the hospital by Dr. Waynard Edwards.  Pulmonary-wise, the patient is an ex-smoker.  No history of known COPD.  He has had chronic atrial fibrillation on  Coumadin, but his INR was subtherapeutic on admission.  He has had mild  pulmonary hypertension documented in October 2004.  Chest x-ray and CT scan  of the chest do show peripheral fibrotic changes previously.  Now, he has  increased density in the right and left mid lung zones.  We are asked to see  the patient to evaluate his pulmonary status.  He was on Cordarone, but this  has now been stopped.   PAST MEDICAL HISTORY:  Medical history of hypertension, hyperlipidemia,  history of gastrointestinal bleeding in 1983, history of gout, history of  decreased  hearing, gastroesophageal reflux disease, atrial fibrillation,  pulmonary hypertension, benign prostatic hypertrophy.   ALLERGIES:  CODEINE.   CURRENT MEDICATIONS:  1. Lasix 40 mg IV.  2. Coumadin.  3. Synthroid 100 mcg daily.  4. Protonix 40 mg daily.  5. Colchicine 0.4 mg daily.  6. Allopurinol 300 mg daily.  7. Norvasc 2.5 mg daily.  8. Lotensin 20 mg daily.  9. Tenormin 50 mg daily.  10.      Crestor 5 mg daily.  11.      Zithromax 500 mg daily.  12.      Aspirin 81 mg daily.  13.      Xopenex q.6h. daily by nebulization.   SOCIAL HISTORY:  Married since 55.  Has three children, six grandchildren.  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  He quit smoking in 1980  but had a 15-pack-  year smoking history.   FAMILY HISTORY:  Father died of a stroke, mother died of hypertension.  Also, coronary artery disease runs in the family.   PHYSICAL EXAMINATION:  VITAL SIGNS: Temperature 98, blood pressure 103/57,  pulse 61, respirations 20, saturation 98% on 2 L.  GENERAL:  This is a well-developed, well-nourished white male in no acute  distress.  CHEST:  Bilateral dry rales, a few expired wheezes.  CARDIAC:  Exam showed an irregularly irregular rhythm with S3.  ABDOMEN:  Soft, nontender.  Bowel sounds active.  No organomegaly.  EXTREMITIES:  No edema or clubbing.  NEUROLOGIC:  Awake and alert.  Moves all fours.   LABORATORY DATA:  Creatinine 1.4, sodium 137, hemoglobin 9.2, white count  12.6.  Liver functions unremarkable.  INR 2.1 but was 1.4 on admission.   Chest x-ray and CT scan of the chest showed bilateral pulmonary fibrotic  changes in the peripheral distribution compatible with usual interstitial  pneumonitis.  Open lung biopsy showed changes compatible with usual  interstitial pneumonitis and in situ thrombus versus pulmonary emboli.   IMPRESSION:  1. Coronary artery disease with recent bypass surgery, myocardial     infarction, mild congestive failure.  2. Usual  interstitial pneumonitis, idiopathic pulmonary fibrosis.  Doubt     pneumonia.  Rule out pulmonary embolism with chronic emboli superimposed     upon chronic fibrotic changes with subsequent hypoxic respiratory     failure.  Suspect Cordarone is contraindicated here and needs to be     discontinued.   RECOMMENDATIONS:  Discontinue Cordarone, check for pulmonary embolism with  spiral CT scan of the chest and venous Doppler ultrasound for thromboembolic  disease.  Check full pulmonary function studies.  Start oral prednisone 40  mg a day.  Recheck room air blood gas.  The patient likely will need home  oxygen therapy.                                               Shan Levans, M.D. [facility]    PW/MEDQ  D:  08/19/2003  T:  08/19/2003  Job:  [id number]

## 2010-06-18 NOTE — Op Note (Signed)
NAME:  [name] [name], [name] [name]                        ACCOUNT NO.:  [id number]   MEDICAL RECORD NO.:  [id number]                   PATIENT TYPE:  INP   LOCATION:  2309                                 FACILITY:  [facility]   PHYSICIAN:  Salvatore Decent. Cornelius Moras, M.D.              DATE OF BIRTH:  March 27, 1930   DATE OF PROCEDURE:  08/06/2003  DATE OF DISCHARGE:                                 OPERATIVE REPORT   PREOPERATIVE DIAGNOSES:  1. Severe three-vessel coronary artery disease.  2. Moderate mitral regurgitation.  3. Recurrent persistent atrial fibrillation.  4. Chronic interstitial lung disease.   POSTOPERATIVE DIAGNOSES:  1. Severe three-vessel coronary artery disease.  2. Moderate mitral regurgitation.  3. Recurrent persistent atrial fibrillation.  4. Chronic interstitial lung disease.  5. Moderate tricuspid regurgitation.   PROCEDURES:  Median sternotomy for coronary artery bypass grafting x3 (left  internal mammary artery to distal left anterior descending coronary artery,  saphenous vein graft to the circumflex marginal branch, saphenous vein graft  to right posterolateral branch, endoscopic saphenous vein harvest from right  thigh), mitral valve annuloplasty (#26 mm CE McCarthy-Adams ring), tricuspid  annuloplasty (#30 mm CE MC3 ring), modified Cox maze IV procedure, open lung  biopsy of left upper lobe.   SURGEON:  Salvatore Decent. Cornelius Moras, M.D.   ASSISTANT:  Coral Ceo, P.A.   ANESTHESIA:  General.   BRIEF CLINICAL NOTE:  The patient is a 77 year old male followed by Loraine Leriche A.  Perini, M.D., with known history of hypertension, hyperlipidemia, and atrial  fibrillation.  He was admitted to the hospital with symptoms of congestive  heart failure and atrial fibrillation on June 27.  He was noted to have  bradycardia with atrial fibrillation with very slow ventricular response as  well as episodes of second degree AV block with intermittent sinus  bradycardia.  He underwent cardiac  catheterization by Colleen Can. Deborah Chalk,  M.D., which demonstrated severe three-vessel coronary artery disease and  moderate left ventricular dysfunction.  Transthoracic echocardiogram  demonstrates moderate left ventricular dysfunction with moderate mitral  regurgitation.  The patient was seen in consultation by Kerin Perna,  M.D., and was felt to be a candidate for surgical intervention.  The patient  was found to have abnormal baseline chest x-ray with evidence suggestive of  chronic interstitial lung disease.  A chest CT scan was also performed,  confirming the presence of pulmonary fibrosis.  Pulmonary function tests  were performed and were acceptable with only mild functional impairment.   OPERATIVE CONSENT:  The patient and his wife have been counseled at length  regarding the indications and potential benefits of surgical intervention  for definitive treatment for a variety of underlying cardiac problems.  The  relative indications and potential benefits of coronary artery bypass  grafting have been discussed at length and alternative treatment strategies  have been reviewed.  The rationale for concomitant mitral valve annuloplasty  and possible maze procedure  have also been discussed at length, and all  associated risks and benefits have been discussed.  The patient understands  that he will likely need a permanent pacemaker placed following surgery due  to the presence of severe bradycardia and evidence of second degree AV block  which was present preoperatively.  The patient and his wife understand and  accept all associated risks of surgery, including but not limited to risk of  death, stroke, myocardial infarction, congestive heart failure, respiratory  failure, pneumonia, renal failure, bleeding requiring blood transfusion,  arrhythmia, bradycardia requiring permanent pacemaker, recurrent coronary  artery disease, recurrent congestive heart failure, recurrent mitral   regurgitation.  All of their questions have been addressed.   OPERATIVE NOTE IN DETAIL:  The patient is brought to the operating room on  the above-mentioned date and central monitoring is established by the  anesthesia service under the care and direction of Guadalupe Maple, M.D.  Specifically, a Swan-Ganz catheter is placed through the right internal  jugular approach.  Of note, upon initial placement of the Swan-Ganz  catheter, the patient's pulmonary artery pressures are elevated, measuring  50/30 with a CVP of 30.  Intravenous antibiotics are administered.  The  patient is placed in the supine position on the operating table.  Following  induction with general endotracheal anesthesia, a Foley catheter is placed.  The patient's chest, abdomen, both groins, and both lower extremities are  prepared and draped in a sterile manner.   Baseline transesophageal echocardiogram is performed by Dr. Noreene Larsson.  This  confirms the presence of moderate left ventricular dysfunction with akinesis  involving the inferior wall of the left ventricle.  The left and right  ventricles are both dilated and the right atrium is severely dilated.  There  is at least moderate (2+) mitral regurgitation.  This appears to be a  central jet of mitral regurgitation with some component of dilatation of the  mitral valve annulus as well as some functional restriction of the posterior  leaflet (type IIIB).  There is also at least moderate (2+ to 3+) tricuspid  regurgitation with a very broad central jet of tricuspid regurgitation.  The  aortic valve appears normal.  No other significant abnormalities are  identified.  Due to the severity of the tricuspid regurgitation with  associated elevated pulmonary artery pressures, tricuspid annuloplasty is  felt to be indicated in addition to the previously-planned procedure.   A median sternotomy incision is performed and the left internal mammary artery is dissected from the  chest wall and prepared for bypass grafting.  The left internal mammary artery is good-quality conduit.  Simultaneously  saphenous vein is obtained from the patient's right thigh and the upper  portion of the right lower leg using endoscopic vein harvest technique.  The  saphenous vein is good-quality conduit.  After the saphenous vein is removed  from the right thigh, the small surgical incision is closed in multiple  layers with running absorbable suture.  The patient is heparinized  systemically.   The pericardium is opened.  The ascending aorta is normal in appearance,  although mildly dilated.  The ascending aorta is cannulated for  cardiopulmonary bypass.  A venous cannula is placed in the superior vena  cava.  A second venous cannula is placed low in the right atrium with tip  extending down the inferior vena cava.  A retrograde cardioplegia catheter  is placed through the right atrium into the coronary sinus.  Adequate  heparinization is verified.  Cardiopulmonary bypass is begun and the surface of the heart is inspected.  There is biventricular enlargement.  There is diffuse coronary artery  disease with evidence of previous inferior wall myocardial infarction.  The  distal right coronary artery is severely and chronically diseased.  The  posterolateral branch off the distal right coronary artery is moderate to  severely diseased, but it is the largest branch of the distal right coronary  artery and it is the only one which is suitable target for grafting.  The  posterior descending coronary artery is quite small and severely diseased.  The left anterior descending coronary artery is intramyocardial.  Portions  of saphenous vein and the left internal mammary artery are trimmed to  appropriate lengths.  Vessel loops are placed around the superior vena cava  and the inferior vena cava.  A cardioplegia catheter is placed at the  ascending aorta.   The patient is cooled to 28  degrees systemic temperature.  The aortic  crossclamp is applied and cardioplegia is delivered initially in an  antegrade fashion through the aortic root.  Iced saline slush is applied for  topical hypothermia.  The initial cardioplegic arrest and myocardial cooling  are felt to be excellent.  Supplemental cardioplegia is administered  retrograde through the coronary sinus catheter.  Repeat doses of  cardioplegia are administered intermittently throughout the crossclamp  portion of the operation through the aortic root, down subsequently-placed  vein grafts, and retrograde through the coronary sinus catheter to maintain  septal temperature below 15 degrees Centigrade.   The heart is gently retracted toward the surgeon's side of the table to  expose the left-sided pulmonary veins.  An elliptical ablation lesion is  created around the base of the left-sided pulmonary veins on the left atrium  using the Medtronic Cardioblate bipolar irrigated radiofrequency ablation device.  This is technically straightforward and performed uneventfully.  Similarly another elliptical ablation lesion is created across the base of  the left atrial appendage.  Following this a short linear ablation lesion is  created using a unipolar irrigated radiofrequency ablation pen, joining the  ellipse surrounding the left-sided pulmonary veins with the ellipse  surrounding the base of the left atrial appendage.   The following distal coronary anastomoses are performed:  (1) The  posterolateral branch off the distal right coronary artery is grafted with a  saphenous vein graft in an end-to-side fashion.  This coronary artery  measures 1.3 mm in diameter at the site of distal bypass and is of poor  quality.  A 1.0 probe will pass in both directions.  (2) The circumflex  marginal branch is grafted with a saphenous vein graft in an end-to-side  fashion.  This coronary artery measures 1.0 mm in diameter and is of fair to   poor quality.  (3) The distal left anterior descending coronary artery is  grafted with the left internal mammary artery in an end-to-side fashion.  This coronary artery is intramyocardial.  It is diffusely diseased.  It  measures 1.7 mm in diameter at the site of distal bypass.  A 1.0 probe will  pass in both directions easily.   A left atriotomy incision is performed posteriorly through interatrial  groove.  The bipolar irrigated radiofrequency ablation device is now  utilized to create an elliptical lesion surrounding the base of the right-  sided pulmonary veins.  The atriotomy incision serves as the anterior half  of this ellipse, and the posterior half is completed with the bipolar  device  using one limb of the device placed posterior to the left atrium and the  second limb along the endocardial surface of the left atrium.  The bipolar  device is then used to create two longitudinal lesions, including a first  lesion placed from the cephalad apex of the atriotomy incision across the  dome of the left atrium to reach the cephalad end of the previous elliptical  lesion surrounding the base of the left-sided pulmonary veins.  A similar  parallel linear lesion is placed across the floor of the left atrium,  joining the caudad-most apex of the left atriotomy incision with the caudad-  most aspect of the ellipse surrounding the left-sided pulmonary veins.  This  is again performed with one limb of the bipolar device posterior to the left  atrium and to the transverse sinus and the other limb along the endocardial  surface.  The bipolar device is then used to create a lesion extending from  the atriotomy incision inferiorly toward the mitral valve annulus  posteriorly.  This extends to within 7 or 8 mm of the mitral valve annulus  near the junction between the P2 and P3 portion of the posterior leaflet of  the mitral valve.  This lesion is completed all of the way to the mitral  valve  annulus using the unipolar irrigated radiofrequency ablation pen from within the left atrium.  This completes the left-sided lesion set to the Cox  maze procedure.   The mitral valve is exposed using a self-retaining retractor.  Exposure is  felt to be satisfactory, although technically challenging.  The mitral valve  is carefully inspected.  The mitral valve leaflets all appear normal.  There  is no sign of mitral valve prolapse or other abnormalities.  Mitral  annuloplasty is performed using interrupted 2-0 Ethibond horizontal mattress  sutures placed circumferentially around the mitral valve annulus.  The  mitral valve is sized to accept a 26 mm annuloplasty ring.  A CE  annuloplasty ring (McCarthy-Adams model number 4100, serial number [id number])  is secured in place uneventfully.  After completion of the mitral valve  annuloplasty, the mitral valve is inspected for competence by instilling  iced saline into the left ventricular chamber.  The valve appears to  function normally, and there is no residual mitral regurgitation.   The left atrial appendage is oversewn from within the left atrium using a  two-layer closure of running 3-0 Prolene suture.   An oblique right atriotomy incision is performed beginning near the acute  margin of the heart and extending in oblique orientation toward the right  inferior pulmonary vein.  The tip of the right atrial appendage is  amputated. The bipolar irrigated radiofrequency ablation device is now  utilized to create linear ablation lesions beginning from the apex of the  right atriotomy incision along the posterior surface of the midportion of  the right atrium just anterior to the right inferior pulmonary vein and  extending in a cephalad direction onto the posterolateral surface of the  superior vena cava.  The ablation device is then turned in the opposite  direction, again with one limb of the device on the endocardial surface and  the other on  the epicardial surface of the right atrium with the tip  extending from the same apex of the atriotomy incision down to the  posterolateral surface of the inferior vena cava.  The ablation device is  now fired across the portion of the interatrial septum  with one limb of the  ablation device in the left atrial limb in the right atrium, extending  across the posterior rim of the interatrial septum to reach the inferior rim  of the fossa ovalis.   The left atriotomy incision is now closed using a two-layer closure of  running 3-0 Prolene suture.  Just prior to completion of the closure, the  lungs are briefly ventilated to evacuate any residual air from the pulmonary  veins and left atrium.  Both proximal saphenous vein anastomoses are  performed directly to the ascending aorta prior to removal of the aortic  crossclamp.  The patient is placed in Trendelenburg position.  The septal  temperature is noted to rise rapidly upon reperfusion of the left internal  mammary artery.  The lungs are ventilated and the aortic root is vented with the Ma goon needle.  The aortic crossclamp is removed after a total  crossclamp time of 148 minutes.   The heart begins to beat spontaneously without need for cardioversion.  The  remainder of the right-sided lesion set of the Cox maze procedure is now  performed:  The bipolar ablation device is utilized to create a linear  lesion beginning from the tip of the right atrial appendage extending along  the anteromedial surface of the right atrial appendage to reach the acute  margin of the heart.  The bipolar device is then fired from the tip of the  right atrial appendage in a direction oriented perpendicular to the previous  right atriotomy incision.  A 2.5 cm area of atrium is left between the apex  of this lesion and the atriotomy incision.  The unipolar irrigated  radiofrequency ablation pen is now utilized to complete the remainder of the  lesion set,  including the lesion extending from the inferior rim of the  fossa ovalis to reach the inferior rim of the coronary sinus and then across  the isthmus of the right atrium to reach the tricuspid annulus along the  posterolateral rim of the tricuspid annulus.  The lesion is completed from  this region also across the isthmus of the right atrium to reach the  inferior vena cava.  Another short linear lesion is created from the apex of  the right atriotomy incision near the acute margin of the heart to reach the  tricuspid annulus along the anterolateral surface.  Finally, one last linear  lesion is created from the tricuspid annulus along the anteromedial surface  to meet the previous bipolar linear lesion at the base of the right atrial  appendage.  This completes the right-sided lesion set of the Cox maze  procedure.   Tricuspid annuloplasty is now performed using interrupted 2-0 Ethibond  horizontal mattress sutures placed circumferentially around the tricuspid  annulus with exception of a small region immediately anterior to the  triangle of Koch.  The tricuspid valve is sized to accept a 30 mm  annuloplasty ring and an Edwards annuloplasty ring (MC3 model number 4900,  serial number [id number]) is secured in place uneventfully.  After completion of  the annuloplasty, the tricuspid valve is inspected for competence by  instilling iced saline into the right ventricular chamber.  There appears to  be no residual leak.  The two right atriotomy incisions are now closed using  a two-layer closure of running 4-0 Prolene suture.   Epicardial pacing wires are fixed to the right ventricular outflow tract and  to the right atrial appendage.  All proximal and distal coronary  anastomoses  are inspected for hemostasis and appropriate graft orientation.  The IVC  cannula is removed and its cannulation site oversewn with Prolene suture.  The patient is rewarmed to 37 degrees Centigrade temperature.  A  low-dose  dopamine infusion is begun.   The patient is weaned from cardiopulmonary bypass without difficulty.  The patient's rhythm at separation from bypass is slow junctional rhythm.  AV  sequential pacing is employed.  Total cardiopulmonary bypass time for the  operation is 210 minutes.  The patient is weaned from bypass on dopamine at  5 mcg/kg per minute.   Follow-up transesophageal echocardiogram performed by Dr. Noreene Larsson after  separation from bypass demonstrates preserved left ventricular function with  considerable decreased size of the left and right ventricular chambers,  respectively.  There is a well-seated mitral annuloplasty ring and a well-  seated tricuspid annuloplasty ring.  There is no residual mitral  regurgitation, and there is trivial residual tricuspid regurgitation.  No  other abnormalities are noted.  There is insignificant residual air.   The aortic root vent is removed.  The SVC cannula is removed and the  cannulation site oversewn with Prolene suture.  The aortic cannula is  removed.  Protamine is administered to reverse the anticoagulation.  The  mediastinum and the left chest are irrigated with saline solution containing  vancomycin.  Meticulous surgical hemostasis is ascertained.   The patient's left lung is carefully examined.  There are some areas of  visible and palpable chronic fibrosis scattered throughout the periphery of  the left upper lobe.  A small wedge resection biopsy of the left upper lobe  is performed using two fires of the Endo-GIA stapling device with 3.5 mm  staple load.  This specimen is sent to pathology fresh in saline for culture  and for permanent histology to rule out pulmonary fibrosis.   The mediastinum and both left and right pleural spaces are drained with four  chest tubes placed through separate stab incisions inferiorly.  The median  sternotomy is closed in routine fashion.  The soft tissues anterior to the  sternum are  closed in multiple layers in routine fashion.  The skin is  closed with a running subcuticular skin closure.   The patient tolerated the procedure well and is transported to the surgical  intensive care unit in stable condition.  There are no intraoperative  complications.  All sponge, instrument, and needle counts are verified  correct at completion of the operation.  No blood products were  administered.                                               Salvatore Decent. Cornelius Moras, M.D.    CHO/MEDQ  D:  08/06/2003  T:  08/07/2003  Job:  [id number]   cc:   Colleen Can. Deborah Chalk, M.D.  Fax: [phone]   Peter M. [place], M.D.  [address]  [place], [place] [address]  Fax: [phone]   Redge Gainer. Waynard Edwards, M.D.  [address]  [place]  [place] [address]  Fax: [phone]

## 2010-07-08 ENCOUNTER — Other Ambulatory Visit: Payer: Self-pay

## 2010-08-12 ENCOUNTER — Other Ambulatory Visit: Payer: Self-pay | Admitting: Dermatology

## 2010-08-12 ENCOUNTER — Other Ambulatory Visit (INDEPENDENT_AMBULATORY_CARE_PROVIDER_SITE_OTHER): Payer: Medicare Other

## 2010-08-12 DIAGNOSIS — I6529 Occlusion and stenosis of unspecified carotid artery: Secondary | ICD-10-CM

## 2010-08-18 NOTE — Procedures (Unsigned)
CAROTID DUPLEX EXAM  INDICATION:  Follow up carotid stenosis.  HISTORY: Diabetes:  No. Cardiac:  CABG. Hypertension:  Yes. Smoking:  No. Previous Surgery:  No. CV History:  Asymptomatic. Amaurosis Fugax No, Paresthesias No, Hemiparesis No.                                      RIGHT             LEFT Brachial systolic pressure:         140               128 Brachial Doppler waveforms:         WNL               WNL Vertebral direction of flow:        Antegrade         Antegrade DUPLEX VELOCITIES (cm/sec) CCA peak systolic                   78                76 ECA peak systolic                   118               188 ICA peak systolic                   84                190 ICA end diastolic                   14                55 PLAQUE MORPHOLOGY:                  Calcified         Calcified PLAQUE AMOUNT:                      Mild              Moderate PLAQUE LOCATION:                    CCA, ICA, ECA     CCA, ICA, ECA  IMPRESSION: 1. Right internal carotid artery stenosis in the 1% to 39% range. 2. Left internal carotid artery stenosis in the 40% to 59% range. 3. Left external carotid artery stenosis present. 4. Essentially unchanged since previous study on 01/07/2010.  ___________________________________________ Quita Skye. Hart Rochester, M.D.  SH/MEDQ  D:  08/12/2010  T:  08/12/2010  Job:  [id number]

## 2010-09-21 ENCOUNTER — Encounter: Payer: Self-pay | Admitting: Cardiology

## 2010-09-29 ENCOUNTER — Other Ambulatory Visit: Payer: Self-pay

## 2010-10-06 ENCOUNTER — Ambulatory Visit (INDEPENDENT_AMBULATORY_CARE_PROVIDER_SITE_OTHER): Payer: Medicare Other | Admitting: Cardiology

## 2010-10-06 ENCOUNTER — Encounter: Payer: Self-pay | Admitting: Cardiology

## 2010-10-06 DIAGNOSIS — I2789 Other specified pulmonary heart diseases: Secondary | ICD-10-CM

## 2010-10-06 DIAGNOSIS — I4891 Unspecified atrial fibrillation: Secondary | ICD-10-CM

## 2010-10-06 DIAGNOSIS — I272 Pulmonary hypertension, unspecified: Secondary | ICD-10-CM | POA: Insufficient documentation

## 2010-10-06 DIAGNOSIS — I10 Essential (primary) hypertension: Secondary | ICD-10-CM

## 2010-10-06 DIAGNOSIS — I2510 Atherosclerotic heart disease of native coronary artery without angina pectoris: Secondary | ICD-10-CM

## 2010-10-06 NOTE — Progress Notes (Signed)
[name] [name] Date of Birth: 05-Jul-1930   History of Present Illness: [name] [name] is seen today for followup. He has continued symptoms of shortness of breath if he walks up an incline. This has not changed significantly. He denies any chest pain. He has had no significant cough. We did obtain an echocardiogram in January. This demonstrated normal left ventricular function. His pulmonary pressures were still moderately elevated with an estimated pressure of 53 mmHg. This is stable compared to his prior studies dating back to 2004.  Current Outpatient Prescriptions on File Prior to Visit  Medication Sig Dispense Refill  . allopurinol (ZYLOPRIM) 100 MG tablet Take 100 mg by mouth daily.        Marland Kitchen aspirin 81 MG tablet Take 81 mg by mouth daily.        Marland Kitchen atenolol (TENORMIN) 50 MG tablet Take 50 mg by mouth daily.        . benazepril (LOTENSIN) 10 MG tablet Take 10 mg by mouth daily.        . finasteride (PROSCAR) 5 MG tablet Take 5 mg by mouth daily.        . furosemide (LASIX) 20 MG tablet Take 1 tablet by mouth Daily.      . lansoprazole (PREVACID) 30 MG capsule Take 30 mg by mouth daily.        Marland Kitchen levothyroxine (SYNTHROID, LEVOTHROID) 100 MCG tablet Take 100 mcg by mouth daily.        . Multiple Vitamin (MULTIVITAMIN) tablet Take 1 tablet by mouth daily.        . potassium chloride SA (K-DUR,KLOR-CON) 20 MEQ tablet Take 20 mEq by mouth daily.        . Tamsulosin HCl (FLOMAX) 0.4 MG CAPS Take 1 tablet by mouth Daily.      Marland Kitchen warfarin (COUMADIN) 5 MG tablet Take 5 mg by mouth as directed.        Marland Kitchen DISCONTD: amLODipine (NORVASC) 5 MG tablet Take 5 mg by mouth daily.          Allergies  Allergen Reactions  . Codeine     Past Medical History  Diagnosis Date  . SOB (shortness of breath) on exertion   . Sleep apnea   . Atrial fibrillation   . SSS (sick sinus syndrome)   . Coronary artery disease   . Hypertension   . Hyperlipidemia   . Pulmonary hypertension   . History of  interstitial lung disease   . Diverticulosis   . Esophageal dilatation   . Hypothyroidism   . Gout     Past Surgical History  Procedure Date  . Cardiac catheterization 08/08/2006    EF 50%  . Cardiac catheterization 07/26/2003    EF 40%  . Insert / replace / remove pacemaker     IMPLANT  . Coronary artery bypass graft 08/2003    LIMA GRAFT TO THE LAD, SAPHENOUS VEIN GRAFT TO THE CIRCUMFLEX, SAPHENOUS VEIN GRAFT TO THE RIGHT POSTERIOR LATERAL BRANCH  . Mitral valve annuloplasty   . Polypectomy   . Cataract extraction   . Cox-maze microwave ablation   . US echocardiography 07/12/2006    EF 50-55%  . US echocardiography 12/16/2003    EF 55-60%  . Cardiovascular stress test 07/11/2006    EF 50%    History  Smoking status  . Former Smoker  . Quit date: 09/20/1980  Smokeless tobacco  . Not on file    History  Alcohol Use No  Family History  Problem Relation Age of Onset  . Hypertension Mother   . Stroke Father   . Coronary artery disease Brother     Review of Systems: The review of systems is positive for everal skin cancers which have been removed from his lower extremities..  All other systems were reviewed and are negative.  Physical Exam: BP 130/80  Pulse 82  Ht 5\' 11"  (1.803 m)  Wt 180 lb 6.4 oz (81.829 kg)  BMI 25.16 kg/m2 The patient is alert and oriented x 3.  The mood and affect are normal.  The skin is warm and dry. He has multiple dermatology incisions on his lower extremities. Color is normal.  The HEENT exam reveals that the sclera are nonicteric.  The mucous membranes are moist.  The carotids are 2+ without bruits.  There is no thyromegaly.  There is no JVD.  The lungs are clear.  The chest wall is non tender.  Pacemaker is in place and is normal. The heart exam reveals a regular rate with a normal S1 and S2.  There is a soft systolic murmur at apex.  The PMI is not displaced.   Abdominal exam reveals good bowel sounds.  There is no guarding or rebound.   There is no hepatosplenomegaly or tenderness.  There are no masses.  Exam of the legs reveal no clubbing, cyanosis, or edema.  The legs are without rashes.  The distal pulses are intact.  Cranial nerves II - XII are intact.  Motor and sensory functions are intact.  The gait is normal. LABORATORY DATA:   Assessment / Plan:

## 2010-10-06 NOTE — Patient Instructions (Signed)
Continue your current medications  I will see you again in 6 months.   

## 2010-10-06 NOTE — Assessment & Plan Note (Signed)
Blood pressure control is very good today. We will continue with the addition of amlodipine.

## 2010-10-06 NOTE — Assessment & Plan Note (Signed)
Stable pulmonary pressures by echocardiogram with moderate elevated estimated pulmonary pressures.

## 2010-10-06 NOTE — Assessment & Plan Note (Signed)
He is having no significant anginal symptoms. We will continue on his current antianginal therapy. His blood pressure has improved significantly with addition of amlodipine.

## 2010-10-06 NOTE — Assessment & Plan Note (Signed)
His rate is well controlled. He is on chronic Coumadin therapy. He does have a VVI pacemaker in place and is followed by Dr. Graciela Husbands.

## 2010-10-07 ENCOUNTER — Encounter: Payer: Self-pay | Admitting: *Deleted

## 2010-10-13 ENCOUNTER — Encounter: Payer: Medicare Other | Admitting: *Deleted

## 2010-10-20 ENCOUNTER — Ambulatory Visit (INDEPENDENT_AMBULATORY_CARE_PROVIDER_SITE_OTHER): Payer: Medicare Other | Admitting: *Deleted

## 2010-10-20 ENCOUNTER — Encounter: Payer: Self-pay | Admitting: Internal Medicine

## 2010-10-20 DIAGNOSIS — I442 Atrioventricular block, complete: Secondary | ICD-10-CM

## 2010-10-20 LAB — PACEMAKER DEVICE OBSERVATION
BMOD-0003RV: 30
BRDY-0004RV: 120 {beats}/min
RV LEAD IMPEDENCE PM: 735 Ohm
VENTRICULAR PACING PM: 100

## 2010-10-20 NOTE — Progress Notes (Signed)
PPM check 

## 2010-10-27 ENCOUNTER — Other Ambulatory Visit: Payer: Self-pay

## 2010-11-16 LAB — POCT I-STAT 3, ART BLOOD GAS (G3+)
Bicarbonate: 24.4 — ABNORMAL HIGH
O2 Saturation: 95
pO2, Arterial: 74 — ABNORMAL LOW

## 2010-11-16 LAB — POCT I-STAT 3, VENOUS BLOOD GAS (G3P V)
Acid-base deficit: 2
Bicarbonate: 23
Operator id: 221371
TCO2: 24

## 2010-12-03 ENCOUNTER — Other Ambulatory Visit: Payer: Self-pay | Admitting: Cardiology

## 2010-12-16 ENCOUNTER — Encounter: Payer: Self-pay | Admitting: Emergency Medicine

## 2010-12-16 ENCOUNTER — Ambulatory Visit (INDEPENDENT_AMBULATORY_CARE_PROVIDER_SITE_OTHER): Payer: Medicare Other | Admitting: Emergency Medicine

## 2010-12-16 DIAGNOSIS — G4730 Sleep apnea, unspecified: Secondary | ICD-10-CM

## 2010-12-16 DIAGNOSIS — I272 Pulmonary hypertension, unspecified: Secondary | ICD-10-CM

## 2010-12-16 DIAGNOSIS — J8410 Pulmonary fibrosis, unspecified: Secondary | ICD-10-CM

## 2010-12-16 DIAGNOSIS — I2789 Other specified pulmonary heart diseases: Secondary | ICD-10-CM

## 2010-12-16 NOTE — Progress Notes (Signed)
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 Has aspiration on modified barium swallow that is largely avoided by chin tuck, taking meds in applesauce.   Last CT scan of the chest was in 12/08.   Jun 01, 2009-- Today FEV1 at 77% w/ no desaturation below 90% w/ walking in office.  ROV 12/29/09 -- returns for f/u of ILD. Says he is feeling very well, better exercise tolerance. Allergies controlled. Cough resolved. He does swallowing precautions. New dx OSA  ROV 12/16/10 - Hx ILD that has been stable on CXR, also OSA. He was recently seen by Dr Waynard Edwards for regular visit and CXR reported to show progression. He states that he does still have exertional SOB, not sure that it is any worse than our last visit a yr ago. No other new sx.  Physical Exam  General: well developed, well nourished, in no acute distress  Head: normocephalic and atraumatic  Eyes: conjunctiva and sclera clear  Nose: no deformity, discharge, inflammation, or lesions  Mouth: no deformity or lesions  Neck: no masses, thyromegaly, or abnormal cervical nodes  Chest Wall: no deformities noted. midline cabg scar present. AICD present  Lungs: faint bibasilar crackles on inspiration  Heart: regular rate and rhythm, S1, S2 without murmurs, rubs, gallops, or clicks  Abdomen: not examined  Msk: no deformity or scoliosis noted with normal posture  Extremities: erythema and hair loss, chronic venous stasis changes  Neurologic: non-focal  Skin: intact without lesions or rashes  Psych: alert and cooperative; normal mood and affect; normal attention span and concentration   INTERSTITIAL LUNG DISEASE In a UIP pattern, unclear etiology. We have been following w serial films. CXR at [facility] with ? Progression. I haven't sen this film yet. His last CT scan was in 2008, probably time for a new one  - get CXR results - walking oximetry today  - High Res CT scan of the chest to  compare w 2008 - biggest intervention is O2 if he desats if he is willing to use.   SLEEP APNEA Not interested in CPAP  Pulmonary hypertension Suspect progression since he is not on CPAP, UIP probably slowly progressing - again, first action would be to start O2 if needed - offered to address his OSA, but still not interested in doing so

## 2010-12-16 NOTE — Assessment & Plan Note (Signed)
In a UIP pattern, unclear etiology. We have been following w serial films. CXR at [facility] with ? Progression. I haven't sen this film yet. His last CT scan was in 2008, probably time for a new one  - get CXR results - walking oximetry today  - High Res CT scan of the chest to compare w 2008 - biggest intervention is O2 if he desats if he is willing to use.

## 2010-12-16 NOTE — Patient Instructions (Signed)
We will obtain your CXR from Dr Perini's office and perform a CT scan of your chest to compare with 2008 Walking oximetry today shows that your oxygen drops slightly, but not to the level where you qualify to have supplemental oxygen.  Follow with Dr Delton Coombes after your CT scan to review the results.

## 2010-12-16 NOTE — Assessment & Plan Note (Signed)
Not interested in CPAP 

## 2010-12-16 NOTE — Assessment & Plan Note (Signed)
Suspect progression since he is not on CPAP, UIP probably slowly progressing - again, first action would be to start O2 if needed - offered to address his OSA, but still not interested in doing so

## 2010-12-20 ENCOUNTER — Ambulatory Visit (INDEPENDENT_AMBULATORY_CARE_PROVIDER_SITE_OTHER)
Admission: RE | Admit: 2010-12-20 | Discharge: 2010-12-20 | Disposition: A | Discharge status: Home / Self Care | Payer: Medicare Other | Source: Ambulatory Visit | Attending: Emergency Medicine | Admitting: Emergency Medicine

## 2010-12-20 DIAGNOSIS — J8410 Pulmonary fibrosis, unspecified: Secondary | ICD-10-CM

## 2011-03-10 ENCOUNTER — Other Ambulatory Visit: Payer: Self-pay | Admitting: Surgery

## 2011-04-19 ENCOUNTER — Encounter: Payer: Medicare Other | Admitting: Internal Medicine

## 2011-05-10 ENCOUNTER — Ambulatory Visit (INDEPENDENT_AMBULATORY_CARE_PROVIDER_SITE_OTHER): Payer: Medicare Other | Admitting: Cardiology

## 2011-05-10 ENCOUNTER — Encounter: Payer: Self-pay | Admitting: Cardiology

## 2011-05-10 VITALS — BP 138/70 | HR 73 | Ht 71.0 in | Wt 173.0 lb

## 2011-05-10 DIAGNOSIS — I4891 Unspecified atrial fibrillation: Secondary | ICD-10-CM

## 2011-05-10 DIAGNOSIS — I10 Essential (primary) hypertension: Secondary | ICD-10-CM

## 2011-05-10 DIAGNOSIS — I2510 Atherosclerotic heart disease of native coronary artery without angina pectoris: Secondary | ICD-10-CM

## 2011-05-10 DIAGNOSIS — I442 Atrioventricular block, complete: Secondary | ICD-10-CM

## 2011-05-10 NOTE — Patient Instructions (Signed)
Continue your current medication.  I will see you again in 6 months.   

## 2011-05-10 NOTE — Assessment & Plan Note (Signed)
He is status post VVIR pacemaker. On his pacemaker checkup it may be worth increasing his rate responsiveness to see if this will help with his dyspnea.

## 2011-05-10 NOTE — Progress Notes (Signed)
[name] [name] Date of Birth: Jul 27, 1930   History of Present Illness: [name] [name] is seen today for followup. He reports that he is doing fairly well. He has lost 7 pounds since his last visit. He still has shortness of breath if he walks up an incline but this has not changed significantly. He denies any cough or edema. He's had no chest pain. He denies any palpitations. He is due for followup in our pacemaker clinic this month.  Current Outpatient Prescriptions on File Prior to Visit  Medication Sig Dispense Refill  . allopurinol (ZYLOPRIM) 100 MG tablet Take 100 mg by mouth daily.        Marland Kitchen amLODipine (NORVASC) 5 MG tablet TAKE 1 TABLET DAILY.  90 tablet  3  . aspirin 81 MG tablet Take 81 mg by mouth daily.        Marland Kitchen atenolol (TENORMIN) 50 MG tablet Take 100 mg by mouth daily.       . benazepril (LOTENSIN) 10 MG tablet Take 10 mg by mouth daily.        . calcium carbonate (OS-CAL) 600 MG TABS Take 600 mg by mouth 2 (two) times daily with a meal.        . Cholecalciferol (VITAMIN D-3 PO) Take by mouth daily.        . fenofibrate micronized (LOFIBRA) 200 MG capsule Take 200 mg by mouth daily before breakfast.        . finasteride (PROSCAR) 5 MG tablet Take 5 mg by mouth daily.        . furosemide (LASIX) 20 MG tablet Take 1 tablet by mouth Daily.      . lansoprazole (PREVACID) 30 MG capsule Take 30 mg by mouth daily.        Marland Kitchen levothyroxine (SYNTHROID, LEVOTHROID) 100 MCG tablet Take 100 mcg by mouth daily.        . Multiple Vitamin (MULTIVITAMIN) tablet Take 1 tablet by mouth daily.        . potassium chloride SA (K-DUR,KLOR-CON) 20 MEQ tablet Take 20 mEq by mouth daily.        . Tamsulosin HCl (FLOMAX) 0.4 MG CAPS Take 1 tablet by mouth Daily.      Marland Kitchen warfarin (COUMADIN) 5 MG tablet Take 5 mg by mouth as directed.          Allergies  Allergen Reactions  . Codeine     Past Medical History  Diagnosis Date  . SOB (shortness of breath) on exertion   . Sleep apnea   . Atrial  fibrillation   . SSS (sick sinus syndrome)   . Coronary artery disease   . Hypertension   . Hyperlipidemia   . Pulmonary hypertension   . History of interstitial lung disease   . Diverticulosis   . Esophageal dilatation   . Hypothyroidism   . Gout     Past Surgical History  Procedure Date  . Cardiac catheterization 08/08/2006    EF 50%  . Cardiac catheterization 07/26/2003    EF 40%  . Insert / replace / remove pacemaker     IMPLANT  . Coronary artery bypass graft 08/2003    LIMA GRAFT TO THE LAD, SAPHENOUS VEIN GRAFT TO THE CIRCUMFLEX, SAPHENOUS VEIN GRAFT TO THE RIGHT POSTERIOR LATERAL BRANCH  . Mitral valve annuloplasty   . Polypectomy   . Cataract extraction   . Cox-maze microwave ablation   . US echocardiography 07/12/2006    EF 50-55%  . US echocardiography  12/16/2003    EF 55-60%  . Cardiovascular stress test 07/11/2006    EF 50%    History  Smoking status  . Former Smoker -- 1.0 packs/day for 15 years  . Types: Cigarettes  . Quit date: 09/20/1980  Smokeless tobacco  . Never Used    History  Alcohol Use No    Family History  Problem Relation Age of Onset  . Hypertension Mother   . Stroke Father   . Coronary artery disease Brother     Review of Systems: As noted in history of present illness.  All other systems were reviewed and are negative.  Physical Exam: BP 138/70  Pulse 73  Ht 5\' 11"  (1.803 m)  Wt 173 lb (78.472 kg)  BMI 24.13 kg/m2 The patient is alert and oriented x 3.  The HEENT exam reveals that the sclera are nonicteric.  The mucous membranes are moist.  The carotids are 2+ without bruits.  There is no thyromegaly.  There is no JVD.  The lungs are clear.  The chest wall is non tender.  Pacemaker site is normal. The heart exam reveals a regular rate with a normal S1 and S2.  There is a soft systolic murmur at apex.  The PMI is not displaced.   Abdominal exam reveals good bowel sounds.  There is no guarding or rebound.  There is no  hepatosplenomegaly or tenderness.  There are no masses.  Exam of the legs reveal no clubbing, cyanosis, or edema.  The legs are without rashes.  The distal pulses are intact.  Cranial nerves II - XII are intact.  Motor and sensory functions are intact.  The gait is normal. LABORATORY DATA: ECG today demonstrates a ventricular paced rhythm at a rate of 73 beats per minute.  Assessment / Plan:

## 2011-05-10 NOTE — Assessment & Plan Note (Signed)
He is now 8 years status post CABG. He remains asymptomatic. His last nuclear stress test in June of 2008  Showed an inferior basal scar and apical ischemia. Cardiac catheterization at that time demonstrated excellent patency of all his grafts. We will continue with his risk factor modification and medical therapy.

## 2011-05-10 NOTE — Assessment & Plan Note (Signed)
His rate is adequately controlled and he is on chronic anticoagulation with Coumadin. Discussed the options of new medications for anticoagulation but at this time he wants to stay with Coumadin.

## 2011-05-12 ENCOUNTER — Encounter: Payer: Self-pay | Admitting: Internal Medicine

## 2011-05-12 ENCOUNTER — Ambulatory Visit (INDEPENDENT_AMBULATORY_CARE_PROVIDER_SITE_OTHER): Payer: Medicare Other | Admitting: Internal Medicine

## 2011-05-12 VITALS — BP 116/57 | HR 61 | Ht 68.0 in | Wt 171.1 lb

## 2011-05-12 DIAGNOSIS — I442 Atrioventricular block, complete: Secondary | ICD-10-CM

## 2011-05-12 DIAGNOSIS — Z950 Presence of cardiac pacemaker: Secondary | ICD-10-CM

## 2011-05-12 DIAGNOSIS — I4891 Unspecified atrial fibrillation: Secondary | ICD-10-CM

## 2011-05-12 NOTE — Assessment & Plan Note (Signed)
Permanent on anticoagulation 

## 2011-05-12 NOTE — Progress Notes (Signed)
HPI  [name] [name] is a 78 y.o. male seen in followup for pacemaker implantation in context of permanent atrial fibrillation   Hehas a history of  bypass surgery in 2005with prior tricuspid and mitral valve annuloplasty. He underwent catheterization in 2008 because of positive dyspnea. His bypasses were patent his left ventricular function was near normal. He continues to have problems with dyspnea. He felt by Dr. Corliss Blacker to have chronic lung disease as well as been identified as having sleep apnea.  He denies chest pain.   Past Medical History  Diagnosis Date  . SOB (shortness of breath) on exertion   . Sleep apnea   . Atrial fibrillation   . SSS (sick sinus syndrome)   . Coronary artery disease   . Hypertension   . Hyperlipidemia   . Pulmonary hypertension   . History of interstitial lung disease   . Diverticulosis   . Esophageal dilatation   . Hypothyroidism   . Gout     Past Surgical History  Procedure Date  . Cardiac catheterization 08/08/2006    EF 50%  . Cardiac catheterization 07/26/2003    EF 40%  . Insert / replace / remove pacemaker     IMPLANT  . Coronary artery bypass graft 08/2003    LIMA GRAFT TO THE LAD, SAPHENOUS VEIN GRAFT TO THE CIRCUMFLEX, SAPHENOUS VEIN GRAFT TO THE RIGHT POSTERIOR LATERAL BRANCH  . Mitral valve annuloplasty   . Polypectomy   . Cataract extraction   . Cox-maze microwave ablation   . US echocardiography 07/12/2006    EF 50-55%  . US echocardiography 12/16/2003    EF 55-60%  . Cardiovascular stress test 07/11/2006    EF 50%    Current Outpatient Prescriptions  Medication Sig Dispense Refill  . allopurinol (ZYLOPRIM) 100 MG tablet Take 100 mg by mouth daily.        Marland Kitchen amLODipine (NORVASC) 5 MG tablet TAKE 1 TABLET DAILY.  90 tablet  3  . aspirin 81 MG tablet Take 81 mg by mouth daily.        Marland Kitchen atenolol (TENORMIN) 50 MG tablet Take 50 mg by mouth daily.       . benazepril (LOTENSIN) 10 MG tablet Take 10 mg by mouth daily.        .  calcium carbonate (OS-CAL) 600 MG TABS Take 600 mg by mouth 2 (two) times daily with a meal.        . Cholecalciferol (VITAMIN D-3 PO) Take by mouth daily.        . fenofibrate micronized (LOFIBRA) 200 MG capsule Take 200 mg by mouth daily before breakfast.        . furosemide (LASIX) 20 MG tablet Take 1 tablet by mouth Daily.      . lansoprazole (PREVACID) 30 MG capsule Take 30 mg by mouth daily.        Marland Kitchen levothyroxine (SYNTHROID, LEVOTHROID) 100 MCG tablet Take 100 mcg by mouth daily.        . Multiple Vitamin (MULTIVITAMIN) tablet Take 1 tablet by mouth daily.        . potassium chloride SA (K-DUR,KLOR-CON) 20 MEQ tablet Take 20 mEq by mouth daily.        . Tamsulosin HCl (FLOMAX) 0.4 MG CAPS Take 1 tablet by mouth Daily.      Marland Kitchen warfarin (COUMADIN) 5 MG tablet Take 5 mg by mouth as directed.          Allergies  Allergen Reactions  .  Codeine     Review of Systems negative except from HPI and PMH  Physical Exam BP 116/57  Pulse 61  Ht 5\' 8"  (1.727 m)  Wt 171 lb 1.9 oz (77.62 kg)  BMI 26.02 kg/m2 Well developed and well nourished in no acute distress HENT normal E scleral and icterus clear Neck Supple JVP flat; carotids brisk and full Clear to ausculation occasional wheeze Regular rate and rhythm, no murmurs gallops or rub Soft with active bowel sounds No clubbing cyanosis none Edema Alert and oriented, grossly normal motor and sensory function Skin Warm and Dry   Assessment and  Plan

## 2011-05-12 NOTE — Assessment & Plan Note (Signed)
Stable pst pacing with cgood heart rate excursion

## 2011-05-12 NOTE — Assessment & Plan Note (Signed)
The patient's device was interrogated.  The information was reviewed. No changes were made in the programming.    

## 2011-07-28 ENCOUNTER — Other Ambulatory Visit: Payer: Self-pay | Admitting: Dermatology

## 2011-08-24 ENCOUNTER — Other Ambulatory Visit: Payer: Self-pay

## 2011-11-07 ENCOUNTER — Other Ambulatory Visit: Payer: Self-pay | Admitting: Cardiology

## 2011-11-08 ENCOUNTER — Encounter: Payer: Self-pay | Admitting: *Deleted

## 2011-11-09 ENCOUNTER — Encounter: Payer: Self-pay | Admitting: Vascular Surgery

## 2011-11-17 ENCOUNTER — Encounter: Payer: Self-pay | Admitting: Cardiology

## 2011-11-17 ENCOUNTER — Ambulatory Visit (INDEPENDENT_AMBULATORY_CARE_PROVIDER_SITE_OTHER): Payer: Medicare Other | Admitting: Cardiology

## 2011-11-17 ENCOUNTER — Encounter: Payer: Self-pay | Admitting: Internal Medicine

## 2011-11-17 ENCOUNTER — Ambulatory Visit (INDEPENDENT_AMBULATORY_CARE_PROVIDER_SITE_OTHER): Payer: Medicare Other | Admitting: *Deleted

## 2011-11-17 VITALS — BP 122/70 | HR 57 | Ht 68.0 in | Wt 173.0 lb

## 2011-11-17 DIAGNOSIS — E785 Hyperlipidemia, unspecified: Secondary | ICD-10-CM

## 2011-11-17 DIAGNOSIS — I2789 Other specified pulmonary heart diseases: Secondary | ICD-10-CM

## 2011-11-17 DIAGNOSIS — I2510 Atherosclerotic heart disease of native coronary artery without angina pectoris: Secondary | ICD-10-CM

## 2011-11-17 DIAGNOSIS — I10 Essential (primary) hypertension: Secondary | ICD-10-CM

## 2011-11-17 DIAGNOSIS — I4891 Unspecified atrial fibrillation: Secondary | ICD-10-CM

## 2011-11-17 DIAGNOSIS — I272 Pulmonary hypertension, unspecified: Secondary | ICD-10-CM

## 2011-11-17 DIAGNOSIS — I442 Atrioventricular block, complete: Secondary | ICD-10-CM

## 2011-11-17 LAB — PACEMAKER DEVICE OBSERVATION
BRDY-0002RV: 60 {beats}/min
BRDY-0004RV: 120 {beats}/min
RV LEAD IMPEDENCE PM: 729 Ohm
RV LEAD THRESHOLD: 0.75 V

## 2011-11-17 NOTE — Patient Instructions (Signed)
Continue your current therapy.  I will see you back in 6 months.   

## 2011-11-17 NOTE — Progress Notes (Signed)
[name] [name] Date of Birth: 1930-02-06   History of Present Illness: [name] [name] is seen today for followup. He states he is doing fairly well. He denies any chest pain or dizziness. He still has shortness of breath with exertion that gets worse when the weather is hot and humid. He states he is still not ready to wear oxygen.  Current Outpatient Prescriptions on File Prior to Visit  Medication Sig Dispense Refill  . allopurinol (ZYLOPRIM) 100 MG tablet Take 100 mg by mouth daily.        Marland Kitchen aspirin 81 MG tablet Take 81 mg by mouth daily.        Marland Kitchen atenolol (TENORMIN) 50 MG tablet Take 50 mg by mouth daily.       . benazepril (LOTENSIN) 10 MG tablet Take 10 mg by mouth daily.        . calcium carbonate (OS-CAL) 600 MG TABS Take 600 mg by mouth 2 (two) times daily with a meal.        . fenofibrate micronized (LOFIBRA) 200 MG capsule Take 200 mg by mouth daily before breakfast.        . furosemide (LASIX) 20 MG tablet Take 1 tablet by mouth Daily.      . lansoprazole (PREVACID) 30 MG capsule Take 30 mg by mouth daily.        Marland Kitchen levothyroxine (SYNTHROID, LEVOTHROID) 100 MCG tablet Take 100 mcg by mouth daily.        . Multiple Vitamin (MULTIVITAMIN) tablet Take 1 tablet by mouth daily.        . potassium chloride SA (K-DUR,KLOR-CON) 20 MEQ tablet Take 20 mEq by mouth daily.        . Tamsulosin HCl (FLOMAX) 0.4 MG CAPS Take 1 tablet by mouth Daily.      Marland Kitchen warfarin (COUMADIN) 5 MG tablet Take 5 mg by mouth as directed.          Allergies  Allergen Reactions  . Codeine     Past Medical History  Diagnosis Date  . SOB (shortness of breath) on exertion   . Sleep apnea   . Atrial fibrillation   . SSS (sick sinus syndrome)   . Coronary artery disease   . Hypertension   . Hyperlipidemia   . Pulmonary hypertension   . History of interstitial lung disease   . Diverticulosis   . Esophageal dilatation   . Hypothyroidism   . Gout     Past Surgical History  Procedure Date  . Cardiac  catheterization 08/08/2006    EF 50%  . Cardiac catheterization 07/26/2003    EF 40%  . Insert / replace / remove pacemaker     IMPLANT  . Coronary artery bypass graft 08/2003    LIMA GRAFT TO THE LAD, SAPHENOUS VEIN GRAFT TO THE CIRCUMFLEX, SAPHENOUS VEIN GRAFT TO THE RIGHT POSTERIOR LATERAL BRANCH  . Mitral valve annuloplasty   . Polypectomy   . Cataract extraction   . Cox-maze microwave ablation   . US echocardiography 07/12/2006    EF 50-55%  . US echocardiography 12/16/2003    EF 55-60%  . Cardiovascular stress test 07/11/2006    EF 50%    History  Smoking status  . Former Smoker -- 1.0 packs/day for 15 years  . Types: Cigarettes  . Quit date: 09/20/1980  Smokeless tobacco  . Never Used    History  Alcohol Use No    Family History  Problem Relation Age of Onset  .  Hypertension Mother   . Stroke Father   . Coronary artery disease Brother     Review of Systems: As noted in history of present illness.  All other systems were reviewed and are negative.  Physical Exam: BP 122/70  Pulse 57  Ht 5\' 8"  (1.727 m)  Wt 173 lb (78.472 kg)  BMI 26.30 kg/m2  SpO2 90% The patient is alert and oriented x 3.  The HEENT exam reveals that the sclera are nonicteric.  The mucous membranes are moist.  The carotids are 2+ without bruits.  There is no thyromegaly.  There is no JVD.  The lungs are clear.  The chest wall is non tender.  Pacemaker site is normal. The heart exam reveals a regular rate with a normal S1 and S2.  There is a soft systolic murmur at apex.  The PMI is not displaced.   Abdominal exam reveals good bowel sounds.  There is no guarding or rebound.  There is no hepatosplenomegaly or tenderness.  There are no masses.  Exam of the legs reveal no clubbing, cyanosis, or edema.  The legs are without rashes.  The distal pulses are intact.  Cranial nerves II - XII are intact.  Motor and sensory functions are intact.  The gait is normal. LABORATORY DATA:   Assessment /  Plan: 1. Coronary disease status post CABG in 2005. Cardiac catheterization 2008 showed patent grafts. He is currently asymptomatic. We will continue therapy with aspirin, atenolol, and amlodipine. Followup again in 6 months.  2. History of complete heart block status post pacemaker implant.  3. Atrial fibrillation. Rate is well controlled. He is on chronic anticoagulation with Coumadin.  4. Interstitial lung disease and pulmonary hypertension. Sat of 90% today at rest. Patient does not want to begin oxygen therapy.

## 2011-11-17 NOTE — Progress Notes (Signed)
PPM check 

## 2011-12-01 ENCOUNTER — Other Ambulatory Visit: Payer: Self-pay | Admitting: *Deleted

## 2011-12-01 DIAGNOSIS — I6529 Occlusion and stenosis of unspecified carotid artery: Secondary | ICD-10-CM

## 2011-12-06 ENCOUNTER — Encounter: Payer: Self-pay | Admitting: Neurosurgery

## 2011-12-07 ENCOUNTER — Ambulatory Visit (INDEPENDENT_AMBULATORY_CARE_PROVIDER_SITE_OTHER): Payer: Medicare Other | Admitting: Neurosurgery

## 2011-12-07 ENCOUNTER — Ambulatory Visit (INDEPENDENT_AMBULATORY_CARE_PROVIDER_SITE_OTHER): Payer: Self-pay | Admitting: Vascular Surgery

## 2011-12-07 ENCOUNTER — Encounter: Payer: Self-pay | Admitting: Neurosurgery

## 2011-12-07 VITALS — BP 142/80 | HR 61 | Resp 16 | Ht 70.5 in | Wt 176.2 lb

## 2011-12-07 DIAGNOSIS — I6529 Occlusion and stenosis of unspecified carotid artery: Secondary | ICD-10-CM

## 2011-12-07 NOTE — Addendum Note (Signed)
Addended by: Sharee Pimple on: 12/07/2011 01:17 PM   Modules accepted: Orders

## 2011-12-07 NOTE — Progress Notes (Signed)
[facility] [place] Carotid Office Note  CC: Carotid surveillance Referring Physician: Hart Rochester  History of Present Illness: 78 year old male patient of Dr. Candie Chroman with no history of carotid intervention. The patient denies any signs or symptoms of CVA, TIA, amaurosis fugax or any neural deficit. The patient denies any medical diagnoses or recent surgery.  Past Medical History  Diagnosis Date  . SOB (shortness of breath) on exertion   . Sleep apnea   . Atrial fibrillation   . SSS (sick sinus syndrome)   . Coronary artery disease   . Hypertension   . Hyperlipidemia   . Pulmonary hypertension   . History of interstitial lung disease   . Diverticulosis   . Esophageal dilatation   . Hypothyroidism   . Gout     ROS: [x]  Positive   [ ]  Denies    General: [ ]  Weight loss, [ ]  Fever, [ ]  chills Neurologic: [ ]  Dizziness, [ ]  Blackouts, [ ]  Seizure [ ]  Stroke, [ ]  "Mini stroke", [ ]  Slurred speech, [ ]  Temporary blindness; [ ]  weakness in arms or legs, [ ]  Hoarseness Cardiac: [ ]  Chest pain/pressure, [ ]  Shortness of breath at rest [ ]  Shortness of breath with exertion, [ ]  Atrial fibrillation or irregular heartbeat Vascular: [ ]  Pain in legs with walking, [ ]  Pain in legs at rest, [ ]  Pain in legs at night,  [ ]  Non-healing ulcer, [ ]  Blood clot in vein/DVT,   Pulmonary: [ ]  Home oxygen, [ ]  Productive cough, [ ]  Coughing up blood, [ ]  Asthma,  [ ]  Wheezing Musculoskeletal:  [ ]  Arthritis, [ ]  Low back pain, [ ]  Joint pain Hematologic: [ ]  Easy Bruising, [ ]  Anemia; [ ]  Hepatitis Gastrointestinal: [ ]  Blood in stool, [ ]  Gastroesophageal Reflux/heartburn, [ ]  Trouble swallowing Urinary: [ ]  chronic Kidney disease, [ ]  on HD - [ ]  MWF or [ ]  TTHS, [ ]  Burning with urination, [ ]  Difficulty urinating Skin: [ ]  Rashes, [ ]  Wounds Psychological: [ ]  Anxiety, [ ]  Depression   Social History History  Substance Use Topics  . Smoking status: Former Smoker -- 1.0  packs/day for 15 years    Types: Cigarettes    Quit date: 09/20/1980  . Smokeless tobacco: Never Used  . Alcohol Use: No    Family History Family History  Problem Relation Age of Onset  . Hypertension Mother   . Stroke Father   . Coronary artery disease Brother     Allergies  Allergen Reactions  . Codeine     Current Outpatient Prescriptions  Medication Sig Dispense Refill  . allopurinol (ZYLOPRIM) 100 MG tablet Take 100 mg by mouth daily.        Marland Kitchen aspirin 81 MG tablet Take 81 mg by mouth daily.        Marland Kitchen atenolol (TENORMIN) 50 MG tablet Take 50 mg by mouth daily.       . benazepril (LOTENSIN) 10 MG tablet Take 10 mg by mouth daily.        . calcium carbonate (OS-CAL) 600 MG TABS Take 600 mg by mouth 2 (two) times daily with a meal.        . Cyanocobalamin (VITAMIN B 12 PO) Take by mouth daily.      . fenofibrate micronized (LOFIBRA) 200 MG capsule Take 200 mg by mouth daily before breakfast.        . furosemide (LASIX) 20 MG tablet Take 1 tablet  by mouth Daily.      . lansoprazole (PREVACID) 30 MG capsule Take 30 mg by mouth daily.        Marland Kitchen levothyroxine (SYNTHROID, LEVOTHROID) 100 MCG tablet Take 100 mcg by mouth daily.        . Multiple Vitamin (MULTIVITAMIN) tablet Take 1 tablet by mouth daily.        . potassium chloride SA (K-DUR,KLOR-CON) 20 MEQ tablet Take 20 mEq by mouth daily.        . Tamsulosin HCl (FLOMAX) 0.4 MG CAPS Take 1 tablet by mouth Daily.      Marland Kitchen warfarin (COUMADIN) 5 MG tablet Take 5 mg by mouth as directed.          Physical Examination  There were no vitals filed for this visit.  There is no height or weight on file to calculate BMI.  General:  WDWN in NAD Gait: Normal HEENT: WNL Eyes: Pupils equal Pulmonary: normal non-labored breathing , without Rales, rhonchi,  wheezing Cardiac: RRR, without  Murmurs, rubs or gallops; Abdomen: soft, NT, no masses Skin: no rashes, ulcers noted  Vascular Exam Pulses: 3+ radial pulses bilaterally Carotid  bruits: Carotid pulses to auscultation no bruits are heard Extremities without ischemic changes, no Gangrene , no cellulitis; no open wounds;  Musculoskeletal: no muscle wasting or atrophy   Neurologic: A&O X 3; Appropriate Affect ; SENSATION: normal; MOTOR FUNCTION:  moving all extremities equally. Speech is fluent/normal  Non-Invasive Vascular Imaging CAROTID DUPLEX 12/07/2011  Right ICA 20 - 39 % stenosis Left ICA 40 - 59 % stenosis   ASSESSMENT/PLAN: Doppler study is essentially unchanged from one year ago. The patient will followup in one year with repeat her Dopplers, his questions were encouraged and answered, he is in agreement with this plan.  Lauree Chandler ANP   Clinic MD: Edilia Bo

## 2011-12-07 NOTE — Progress Notes (Signed)
Carotid stenosis

## 2012-02-06 ENCOUNTER — Other Ambulatory Visit: Payer: Self-pay | Admitting: Dermatology

## 2012-03-05 ENCOUNTER — Other Ambulatory Visit: Payer: Self-pay | Admitting: Dermatology

## 2012-04-17 ENCOUNTER — Other Ambulatory Visit: Payer: Self-pay | Admitting: Otolaryngology

## 2012-04-17 ENCOUNTER — Ambulatory Visit
Admission: RE | Admit: 2012-04-17 | Discharge: 2012-04-17 | Disposition: A | Discharge status: Home / Self Care | Payer: Medicare Other | Source: Ambulatory Visit | Attending: Otolaryngology | Admitting: Otolaryngology

## 2012-04-17 ENCOUNTER — Other Ambulatory Visit (HOSPITAL_COMMUNITY)
Admission: RE | Admit: 2012-04-17 | Discharge: 2012-04-17 | Disposition: A | Discharge status: Home / Self Care | Payer: Medicare Other | Source: Ambulatory Visit | Attending: Otolaryngology | Admitting: Otolaryngology

## 2012-04-17 ENCOUNTER — Telehealth: Payer: Self-pay

## 2012-04-17 DIAGNOSIS — A318 Other mycobacterial infections: Secondary | ICD-10-CM

## 2012-04-17 DIAGNOSIS — R599 Enlarged lymph nodes, unspecified: Secondary | ICD-10-CM | POA: Insufficient documentation

## 2012-04-17 MED ORDER — IOHEXOL 300 MG/ML  SOLN
40.0000 mL | Freq: Once | INTRAMUSCULAR | Status: AC | PRN
  Administered 2012-04-17: 40 mL via INTRAVENOUS

## 2012-04-17 NOTE — Telephone Encounter (Signed)
Received phone call from Dr.Chris [facility].He needs surgical clearance.Patient scheduled for neck surgery this Friday 04/20/12.Patient takes coumadin.Dr.Newman was told told Dr.Jordan out of office today,will send message to him for advice.

## 2012-04-17 NOTE — Telephone Encounter (Signed)
I would not bridge with Lovenox for surgery. I would stop coumadin 4 days before. He is cleared from a cardiac standpoint for surgery.  Peter Swaziland MD, [facility]

## 2012-04-17 NOTE — Telephone Encounter (Signed)
Spoke to Susie at [facility] office was told Dr.Jordan cleared patient from a cardiac standpoint for surgery.Advised patient will need to hold coumadin 4 days prior to surgery.Message faxed to Susie at fax # [phone].

## 2012-04-18 ENCOUNTER — Other Ambulatory Visit: Payer: Self-pay | Admitting: Otolaryngology

## 2012-04-18 ENCOUNTER — Other Ambulatory Visit: Payer: Self-pay | Admitting: Oncology

## 2012-04-18 ENCOUNTER — Encounter (HOSPITAL_COMMUNITY): Payer: Self-pay | Admitting: Pharmacy Technician

## 2012-04-18 NOTE — H&P (Signed)
PREOPERATIVE H&P  Chief Complaint: lower lip cancer  HPI: [name] [name] is a 79 y.o. male who presents for evaluation of a lower lip cancer and a newly enlarged submental node. He's had a history of scca of skin and lower lip and is status post excision and ablation of a lower lip cancer with Dr. Terri Piedra on 03/05/12. Last week he noted a new nodule underneath his chin and was referred to me for evaluation. FNA was positive for SCCa and CT scan demonstrated a 1 cm submental nodule.  He's taken to the OR for wedge excision of the lower lip cancer and excision of submental lymph nodes.  Past Medical History  Diagnosis Date  . SOB (shortness of breath) on exertion   . Sleep apnea   . Atrial fibrillation   . SSS (sick sinus syndrome)   . Coronary artery disease   . Hypertension   . Hyperlipidemia   . Pulmonary hypertension   . History of interstitial lung disease   . Diverticulosis   . Esophageal dilatation   . Hypothyroidism   . Gout    Past Surgical History  Procedure Laterality Date  . Cardiac catheterization  08/08/2006    EF 50%  . Cardiac catheterization  07/26/2003    EF 40%  . Insert / replace / remove pacemaker      IMPLANT  . Coronary artery bypass graft  08/2003    LIMA GRAFT TO THE LAD, SAPHENOUS VEIN GRAFT TO THE CIRCUMFLEX, SAPHENOUS VEIN GRAFT TO THE RIGHT POSTERIOR LATERAL BRANCH  . Mitral valve annuloplasty    . Polypectomy    . Cataract extraction    . Cox-maze microwave ablation    . US echocardiography  07/12/2006    EF 50-55%  . US echocardiography  12/16/2003    EF 55-60%  . Cardiovascular stress test  07/11/2006    EF 50%   History   Social History  . Marital Status: Married    Spouse Name: N/A    Number of Children: N/A  . Years of Education: N/A   Social History Main Topics  . Smoking status: Former Smoker -- 1.00 packs/day for 15 years    Types: Cigarettes    Quit date: 09/20/1980  . Smokeless tobacco: Never Used  . Alcohol Use: No  . Drug  Use: No  . Sexually Active: Not on file   Other Topics Concern  . Not on file   Social History Narrative  . No narrative on file   Family History  Problem Relation Age of Onset  . Hypertension Mother   . Stroke Father   . Coronary artery disease Brother    Allergies  Allergen Reactions  . Codeine Nausea Only   Prior to Admission medications   Medication Sig Start Date End Date Taking? Authorizing Provider  allopurinol (ZYLOPRIM) 100 MG tablet Take 100 mg by mouth daily.     Yes Historical Provider, MD  amLODipine (NORVASC) 5 MG tablet daily. 11/07/11  Yes Historical Provider, MD  aspirin 81 MG tablet Take 81 mg by mouth daily.     Yes Historical Provider, MD  atenolol (TENORMIN) 50 MG tablet Take 50 mg by mouth daily.    Yes Historical Provider, MD  benazepril (LOTENSIN) 10 MG tablet Take 10 mg by mouth daily.     Yes Historical Provider, MD  calcium carbonate (OS-CAL) 600 MG TABS Take 600 mg by mouth 2 (two) times daily with a meal.     Yes Historical  Provider, MD  Cholecalciferol (VITAMIN D PO) Take 1 tablet by mouth daily.   Yes Historical Provider, MD  Cyanocobalamin (VITAMIN B 12 PO) Take by mouth daily.   Yes Historical Provider, MD  fenofibrate micronized (LOFIBRA) 200 MG capsule Take 200 mg by mouth daily before breakfast.     Yes Historical Provider, MD  furosemide (LASIX) 20 MG tablet Take 1 tablet by mouth Daily. 07/07/10  Yes Historical Provider, MD  lansoprazole (PREVACID) 30 MG capsule Take 30 mg by mouth daily.     Yes Historical Provider, MD  levothyroxine (SYNTHROID, LEVOTHROID) 100 MCG tablet Take 100 mcg by mouth daily.     Yes Historical Provider, MD  potassium chloride SA (K-DUR,KLOR-CON) 20 MEQ tablet Take 20 mEq by mouth daily.     Yes Historical Provider, MD  tamsulosin (FLOMAX) 0.4 MG CAPS Take 0.4 mg by mouth daily.   Yes Historical Provider, MD  warfarin (COUMADIN) 5 MG tablet Take 5 mg by mouth daily.    Yes Historical Provider, MD     Positive ROS:  negative  All other systems have been reviewed and were otherwise negative with the exception of those mentioned in the HPI and as above.  Physical Exam: There were no vitals filed for this visit.  General: Alert, no acute distress Oral: Normal oral mucosa and tonsils. Lower lip with 1 cm scar just left of midline Nasal: Clear nasal passages Neck:1 cm firm submental node Ear: Ear canal is clear with normal appearing TMs Cardiovascular: Regular rate and rhythm, no murmur. Hx afib with pacemaker  Respiratory: Clear to auscultation Neurologic: Alert and oriented x 3   Assessment/Plan: lower lip cancer Plan for Procedure(s): EXCISION OF LOWER LIP CANCER AND EXCISION SUBMENTAL LYMPH NODES   Dillard Cannon, MD 04/18/2012 12:22 PM

## 2012-04-19 ENCOUNTER — Encounter (HOSPITAL_COMMUNITY)
Admission: RE | Admit: 2012-04-19 | Discharge: 2012-04-19 | Disposition: A | Discharge status: Home / Self Care | Payer: Medicare Other | Source: Ambulatory Visit | Attending: Anesthesiology | Admitting: Anesthesiology

## 2012-04-19 ENCOUNTER — Encounter (HOSPITAL_COMMUNITY): Payer: Self-pay

## 2012-04-19 ENCOUNTER — Encounter (HOSPITAL_COMMUNITY)
Admission: RE | Admit: 2012-04-19 | Discharge: 2012-04-19 | Disposition: A | Discharge status: Home / Self Care | Payer: Medicare Other | Source: Ambulatory Visit | Attending: Otolaryngology | Admitting: Otolaryngology

## 2012-04-19 HISTORY — DX: Acute myocardial infarction, unspecified: I21.9

## 2012-04-19 HISTORY — DX: Gastro-esophageal reflux disease without esophagitis: K21.9

## 2012-04-19 HISTORY — DX: Presence of cardiac pacemaker: Z95.0

## 2012-04-19 HISTORY — DX: Unspecified osteoarthritis, unspecified site: M19.90

## 2012-04-19 HISTORY — DX: Malignant (primary) neoplasm, unspecified: C80.1

## 2012-04-19 LAB — CBC
Hemoglobin: 15.8 g/dL (ref 13.0–17.0)
MCH: 32.2 pg (ref 26.0–34.0)
MCV: 93.3 fL (ref 78.0–100.0)
Platelets: 181 10*3/uL (ref 150–400)
RBC: 4.91 MIL/uL (ref 4.22–5.81)
WBC: 9.7 10*3/uL (ref 4.0–10.5)

## 2012-04-19 LAB — BASIC METABOLIC PANEL
CO2: 27 mEq/L (ref 19–32)
GFR calc non Af Amer: 44 mL/min — ABNORMAL LOW (ref >90)
Glucose, Bld: 87 mg/dL (ref 70–99)
Potassium: 4.2 mEq/L (ref 3.5–5.1)
Sodium: 139 mEq/L (ref 135–145)

## 2012-04-19 LAB — SURGICAL PCR SCREEN
MRSA, PCR: NEGATIVE
Staphylococcus aureus: NEGATIVE

## 2012-04-19 LAB — PROTIME-INR: INR: 1.48 (ref 0.00–1.49)

## 2012-04-19 LAB — APTT: aPTT: 48 seconds — ABNORMAL HIGH (ref 24–37)

## 2012-04-19 MED ORDER — CEFAZOLIN SODIUM-DEXTROSE 2-3 GM-% IV SOLR
2.0000 g | INTRAVENOUS | Status: AC
  Administered 2012-04-20: 2 g via INTRAVENOUS
  Filled 2012-04-19: qty 50

## 2012-04-19 NOTE — Pre-Procedure Instructions (Signed)
[name] [name]  04/19/2012   Your procedure is scheduled on:  Friday, March 21st  Report to [facility] at 0630 AM.  Call this number if you have problems the morning of surgery: [phone]   Remember:   Do not eat food or drink liquids after midnight.    Take these medicines the morning of surgery with A SIP OF WATER: Norvasc, Atenolol, Prevacid, Synthroid, Flomax   Do not wear jewelry.  Do not wear lotions, powders, or perfumes,deodorant.  Do not shave 48 hours prior to surgery. Men may shave face and neck.  Do not bring valuables to the hospital.  Contacts, dentures or bridgework may not be worn into surgery.  Leave suitcase in the car. After surgery it may be brought to your room.  For patients admitted to the hospital, checkout time is 11:00 AM the day of discharge.   Patients discharged the day of surgery will not be allowed to drive home.    Special Instructions: Shower using CHG 2 nights before surgery and the night before surgery.  If you shower the day of surgery use CHG.  Use special wash - you have one bottle of CHG for all showers.  You should use approximately 1/3 of the bottle for each shower.   Please read over the following fact sheets that you were given: Pain Booklet, Coughing and Deep Breathing, MRSA Information and Surgical Site Infection Prevention

## 2012-04-19 NOTE — Progress Notes (Signed)
Primary Physician- Dr. Waynard Edwards Cardiologist - Dr. Swaziland EKG April 2013 in chart Stress, cath [phone] in epic

## 2012-04-19 NOTE — Progress Notes (Signed)
Anesthesia chart review: Patient is an 79 year old male scheduled for wedge resection of lower lip cancer and excision of submental lymph node by Dr. Ezzard Standing on 04/20/2012.  His PAT visit was 04/19/12.  I was given chart to review after patient had left his PAT appointment.  History includes afib, SSS s/p Metronic PPM 08/12/2003, CAD s/p CABG (LIMA to LAD, SVG to CX, SVG to PLA) with tricuspid and mitral valve annuloplasty, modified Cox Maze procedure 08/06/2003, HTN, HLD, hypothyroidism, gout, interstitial lung disease/pulmonary fibrosis diagnosed 2005, former smoker since '82, OSA, carotid artery stenosis (20-39% RICAS, 40-59% LICAS by 12/07/11 duplex; Dr. Hart Rochester).  Pulmonologist is Dr. Delton Coombes, last visit 12/2010 with stable fibrosis by CT then and no desaturations.  By notes, his biggest recommendation then was to start supplemental O2 if he get to a point where it is required.  Patient was refusing CPAP at that time. Also according to notes, FEV1 on 06/01/09 was at 77% with no desaturation below 90% with walking. Sats 95% today at PAT. PCP is Dr. Waynard Edwards.  Cardiologist is Dr. Swaziland.  He is aware of planned surgery and cleared him from a cardiac standpoint.  He did not recommend Lovenox bridging while patient was off his Coumadin.  EP Cardiologist is Dr. Graciela Husbands.  EKG on 05/10/11 showed v-paced rhythm.  Echo on 02/25/10 showed moderate LVH, normal LV systolic function, EF 50-55%, normal wall motion, no regional wall motion abnormalities study was not technically sufficient to allow evaluation of LV diastolic function. Mild aortic regurgitation. Evidence of mitral valve repair. Trivial mitral regurgitation. Mild pulmonic regurgitation. Evidence of tricuspid valve repair. Mild tricuspid regurgitation. Mildly dilated left atrium. PA peak pressure 53 mmHg (S)  Cardiac cath on 08/08/06 showed: 1. Severe three-vessel obstructive atherosclerotic coronary artery disease.  2. All bypass grafts were patent including  saphenous vein graft to the distal right coronary artery, saphenous vein graft to the distal circumflex and LIMA graft to the LAD.  3. Low normal left ventricular systolic function.  4. Mild mitral insufficiency.  5. Probable mild degree of mitral valve stenosis. The patient does have large reflected B-wave.  6. Moderate to severe pulmonary hypertension.  Chest CT on 12/20/10 showed: 1. Peripheral and basilar predominant pattern of subpleural  fibrosis is characteristic of usual interstitial pneumonitis (UIP),  without interval change (from 01/08/07).  2. Mediastinal adenopathy is typical of UIP.  CXR on 04/19/12 showed advanced pulmonary fibrosis.  No acute abnormality and no change from prior study (12/29/09).  Anesthesiologist Dr. Jean Rosenthal notified of cardiopulmonary history.  If no acute symptoms would anticipate he could proceed.  He will be evaluated by his assigned anesthesiologist on the day of surgery to discuss the definitive anesthesia plan (GA versus MAC--currently case is posted for GA.)  Velna Ochs [facility]/Anesthesiology Phone [phone] 04/19/2012 3:00 PM

## 2012-04-20 ENCOUNTER — Encounter (HOSPITAL_COMMUNITY)
Admission: RE | Disposition: A | Discharge status: Home / Self Care | Payer: Self-pay | Source: Ambulatory Visit | Attending: Otolaryngology

## 2012-04-20 ENCOUNTER — Encounter (HOSPITAL_COMMUNITY): Payer: Self-pay | Admitting: *Deleted

## 2012-04-20 ENCOUNTER — Ambulatory Visit (HOSPITAL_COMMUNITY)
Admission: RE | Admit: 2012-04-20 | Discharge: 2012-04-20 | Disposition: A | Discharge status: Home / Self Care | Payer: Medicare Other | Source: Ambulatory Visit | Attending: Otolaryngology | Admitting: Otolaryngology

## 2012-04-20 ENCOUNTER — Encounter (HOSPITAL_COMMUNITY): Payer: Self-pay | Admitting: Vascular Surgery

## 2012-04-20 ENCOUNTER — Ambulatory Visit (HOSPITAL_COMMUNITY): Payer: Medicare Other | Admitting: Anesthesiology

## 2012-04-20 DIAGNOSIS — Z79899 Other long term (current) drug therapy: Secondary | ICD-10-CM | POA: Insufficient documentation

## 2012-04-20 DIAGNOSIS — C770 Secondary and unspecified malignant neoplasm of lymph nodes of head, face and neck: Secondary | ICD-10-CM | POA: Insufficient documentation

## 2012-04-20 DIAGNOSIS — I495 Sick sinus syndrome: Secondary | ICD-10-CM | POA: Insufficient documentation

## 2012-04-20 DIAGNOSIS — Z7982 Long term (current) use of aspirin: Secondary | ICD-10-CM | POA: Insufficient documentation

## 2012-04-20 DIAGNOSIS — I2510 Atherosclerotic heart disease of native coronary artery without angina pectoris: Secondary | ICD-10-CM | POA: Insufficient documentation

## 2012-04-20 DIAGNOSIS — I252 Old myocardial infarction: Secondary | ICD-10-CM | POA: Insufficient documentation

## 2012-04-20 DIAGNOSIS — E039 Hypothyroidism, unspecified: Secondary | ICD-10-CM | POA: Insufficient documentation

## 2012-04-20 DIAGNOSIS — I739 Peripheral vascular disease, unspecified: Secondary | ICD-10-CM | POA: Insufficient documentation

## 2012-04-20 DIAGNOSIS — Z7901 Long term (current) use of anticoagulants: Secondary | ICD-10-CM | POA: Insufficient documentation

## 2012-04-20 DIAGNOSIS — I10 Essential (primary) hypertension: Secondary | ICD-10-CM | POA: Insufficient documentation

## 2012-04-20 DIAGNOSIS — Z950 Presence of cardiac pacemaker: Secondary | ICD-10-CM | POA: Insufficient documentation

## 2012-04-20 DIAGNOSIS — I4891 Unspecified atrial fibrillation: Secondary | ICD-10-CM | POA: Insufficient documentation

## 2012-04-20 DIAGNOSIS — G4730 Sleep apnea, unspecified: Secondary | ICD-10-CM | POA: Insufficient documentation

## 2012-04-20 DIAGNOSIS — Z87891 Personal history of nicotine dependence: Secondary | ICD-10-CM | POA: Insufficient documentation

## 2012-04-20 DIAGNOSIS — K219 Gastro-esophageal reflux disease without esophagitis: Secondary | ICD-10-CM | POA: Insufficient documentation

## 2012-04-20 DIAGNOSIS — E785 Hyperlipidemia, unspecified: Secondary | ICD-10-CM | POA: Insufficient documentation

## 2012-04-20 DIAGNOSIS — Z885 Allergy status to narcotic agent status: Secondary | ICD-10-CM | POA: Insufficient documentation

## 2012-04-20 DIAGNOSIS — M109 Gout, unspecified: Secondary | ICD-10-CM | POA: Insufficient documentation

## 2012-04-20 DIAGNOSIS — C001 Malignant neoplasm of external lower lip: Secondary | ICD-10-CM | POA: Insufficient documentation

## 2012-04-20 DIAGNOSIS — Z954 Presence of other heart-valve replacement: Secondary | ICD-10-CM | POA: Insufficient documentation

## 2012-04-20 DIAGNOSIS — I2789 Other specified pulmonary heart diseases: Secondary | ICD-10-CM | POA: Insufficient documentation

## 2012-04-20 HISTORY — PX: MASS EXCISION: SHX2000

## 2012-04-20 SURGERY — EXCISION MASS
Anesthesia: General | Site: Chin | Wound class: Clean

## 2012-04-20 MED ORDER — LIDOCAINE HCL (CARDIAC) 20 MG/ML IV SOLN
INTRAVENOUS | Status: DC | PRN
  Administered 2012-04-20: 60 mg via INTRAVENOUS

## 2012-04-20 MED ORDER — ROCURONIUM BROMIDE 100 MG/10ML IV SOLN
INTRAVENOUS | Status: DC | PRN
  Administered 2012-04-20: 25 mg via INTRAVENOUS

## 2012-04-20 MED ORDER — LACTATED RINGERS IV SOLN
INTRAVENOUS | Status: DC
  Administered 2012-04-20 (×2): via INTRAVENOUS

## 2012-04-20 MED ORDER — HYDROCODONE-ACETAMINOPHEN 5-325 MG PO TABS: 1.0000 | ORAL_TABLET | ORAL | Status: DC | PRN

## 2012-04-20 MED ORDER — MEPERIDINE HCL 25 MG/ML IJ SOLN: 6.2500 mg | INTRAMUSCULAR | Status: DC | PRN

## 2012-04-20 MED ORDER — PROMETHAZINE HCL 25 MG/ML IJ SOLN: 6.2500 mg | INTRAMUSCULAR | Status: DC | PRN

## 2012-04-20 MED ORDER — ONDANSETRON HCL 4 MG/2ML IJ SOLN
INTRAMUSCULAR | Status: DC | PRN
  Administered 2012-04-20: 4 mg via INTRAVENOUS

## 2012-04-20 MED ORDER — FENTANYL CITRATE 0.05 MG/ML IJ SOLN
INTRAMUSCULAR | Status: DC | PRN
  Administered 2012-04-20: 50 ug via INTRAVENOUS
  Administered 2012-04-20: 100 ug via INTRAVENOUS

## 2012-04-20 MED ORDER — HYDROMORPHONE HCL PF 1 MG/ML IJ SOLN: 0.2500 mg | INTRAMUSCULAR | Status: DC | PRN

## 2012-04-20 MED ORDER — LIDOCAINE-EPINEPHRINE 1 %-1:100000 IJ SOLN
INTRAMUSCULAR | Status: DC | PRN
  Administered 2012-04-20: 8 mL

## 2012-04-20 MED ORDER — CEPHALEXIN 500 MG PO CAPS: 500.0000 mg | ORAL_CAPSULE | Freq: Three times a day (TID) | ORAL | Status: AC

## 2012-04-20 MED ORDER — 0.9 % SODIUM CHLORIDE (POUR BTL) OPTIME
TOPICAL | Status: DC | PRN
  Administered 2012-04-20: 1000 mL

## 2012-04-20 MED ORDER — LIDOCAINE HCL 4 % MT SOLN
OROMUCOSAL | Status: DC | PRN
  Administered 2012-04-20: 4 mL via TOPICAL

## 2012-04-20 MED ORDER — BACITRACIN ZINC 500 UNIT/GM EX OINT
TOPICAL_OINTMENT | CUTANEOUS | Status: DC | PRN
  Administered 2012-04-20: 1 via TOPICAL

## 2012-04-20 MED ORDER — PROPOFOL 10 MG/ML IV BOLUS
INTRAVENOUS | Status: DC | PRN
  Administered 2012-04-20: 120 mg via INTRAVENOUS

## 2012-04-20 MED ORDER — PHENYLEPHRINE HCL 10 MG/ML IJ SOLN
INTRAMUSCULAR | Status: DC | PRN
  Administered 2012-04-20 (×5): 80 ug via INTRAVENOUS

## 2012-04-20 MED ORDER — ACETAMINOPHEN 10 MG/ML IV SOLN: 1000.0000 mg | Freq: Once | INTRAVENOUS | Status: DC | PRN

## 2012-04-20 SURGICAL SUPPLY — 44 items
BLADE SURG 15 STRL LF DISP TIS (BLADE) ×1 IMPLANT
BLADE SURG 15 STRL SS (BLADE) ×1
CANISTER SUCTION 2500CC (MISCELLANEOUS) ×2 IMPLANT
CLEANER TIP ELECTROSURG 2X2 (MISCELLANEOUS) ×2 IMPLANT
CLOTH BEACON ORANGE TIMEOUT ST (SAFETY) ×2 IMPLANT
CONT SPEC 4OZ CLIKSEAL STRL BL (MISCELLANEOUS) ×2 IMPLANT
CORDS BIPOLAR (ELECTRODE) ×2 IMPLANT
COVER SURGICAL LIGHT HANDLE (MISCELLANEOUS) ×2 IMPLANT
DERMABOND ADVANCED (GAUZE/BANDAGES/DRESSINGS) ×1
DERMABOND ADVANCED .7 DNX12 (GAUZE/BANDAGES/DRESSINGS) ×1 IMPLANT
DRAIN PENROSE 1/4X12 LTX STRL (WOUND CARE) ×2 IMPLANT
DRSG MEPILEX BORDER 4X8 (GAUZE/BANDAGES/DRESSINGS) ×2 IMPLANT
ELECT COATED BLADE 2.86 ST (ELECTRODE) ×2 IMPLANT
ELECT REM PT RETURN 9FT ADLT (ELECTROSURGICAL) ×2
ELECTRODE REM PT RTRN 9FT ADLT (ELECTROSURGICAL) ×1 IMPLANT
GLOVE BIOGEL PI IND STRL 6 (GLOVE) ×1 IMPLANT
GLOVE BIOGEL PI IND STRL 6.5 (GLOVE) ×1 IMPLANT
GLOVE BIOGEL PI IND STRL 7.0 (GLOVE) ×1 IMPLANT
GLOVE BIOGEL PI INDICATOR 6 (GLOVE) ×1
GLOVE BIOGEL PI INDICATOR 6.5 (GLOVE) ×1
GLOVE BIOGEL PI INDICATOR 7.0 (GLOVE) ×1
GLOVE ECLIPSE 7.5 STRL STRAW (GLOVE) ×2 IMPLANT
GLOVE SURG SS PI 7.0 STRL IVOR (GLOVE) ×2 IMPLANT
GOWN STRL NON-REIN LRG LVL3 (GOWN DISPOSABLE) ×4 IMPLANT
GOWN STRL REIN XL XLG (GOWN DISPOSABLE) ×2 IMPLANT
KIT BASIN OR (CUSTOM PROCEDURE TRAY) ×2 IMPLANT
KIT ROOM TURNOVER OR (KITS) ×2 IMPLANT
NEEDLE 25GX 5/8IN NON SAFETY (NEEDLE) IMPLANT
NS IRRIG 1000ML POUR BTL (IV SOLUTION) ×2 IMPLANT
PAD ARMBOARD 7.5X6 YLW CONV (MISCELLANEOUS) ×4 IMPLANT
PENCIL FOOT CONTROL (ELECTRODE) ×2 IMPLANT
SPONGE GAUZE 4X4 12PLY (GAUZE/BANDAGES/DRESSINGS) ×4 IMPLANT
SUT CHROMIC 4 0 P 3 18 (SUTURE) ×2 IMPLANT
SUT ETHILON 2 0 FS 18 (SUTURE) ×2 IMPLANT
SUT ETHILON 4 0 PS 2 18 (SUTURE) ×2 IMPLANT
SUT ETHILON 5 0 P 3 18 (SUTURE) ×3
SUT NYLON ETHILON 5-0 P-3 1X18 (SUTURE) ×3 IMPLANT
SUT SILK 4 0 (SUTURE) ×1
SUT SILK 4-0 18XBRD TIE 12 (SUTURE) ×1 IMPLANT
SUT VIC AB 4-0 RB1 18 (SUTURE) ×2 IMPLANT
TAPE PAPER 3X10 WHT MICROPORE (GAUZE/BANDAGES/DRESSINGS) ×2 IMPLANT
TOWEL OR 17X24 6PK STRL BLUE (TOWEL DISPOSABLE) ×2 IMPLANT
TOWEL OR 17X26 10 PK STRL BLUE (TOWEL DISPOSABLE) ×2 IMPLANT
TRAY ENT MC OR (CUSTOM PROCEDURE TRAY) ×2 IMPLANT

## 2012-04-20 NOTE — Anesthesia Preprocedure Evaluation (Addendum)
Anesthesia Evaluation  Patient identified by MRN, date of birth, ID band Patient awake    Reviewed: Allergy & Precautions, H&P , NPO status , Patient's Chart, lab work & pertinent test results, reviewed documented beta blocker date and time   Airway Mallampati: II TM Distance: >3 FB Neck ROM: Full    Dental  (+) Dental Advisory Given, Teeth Intact and Chipped   Pulmonary neg pulmonary ROS, shortness of breath and with exertion, sleep apnea ,  breath sounds clear to auscultation        Cardiovascular hypertension, Pt. on medications and Pt. on home beta blockers + CAD, + Past MI, + CABG and + Peripheral Vascular Disease + dysrhythmias Atrial Fibrillation + pacemaker Rhythm:Regular Rate:Normal  Echo 2005  -  Left ventricular ejection fraction was estimated , range being 45 % to 50 %.  -  MV Annuloplasty ring noted. Mildly elevated transmitral gradient.   Normal MVA by PHT. Mean transmitral gradient was 6.1 mmHg.  Mitral valve area by pressure half-time was 2.24 cm^2.  -  The left atrium was mildly dilated.  -  Pacemaker lead seen in RV.   Pacer in VVIR-99% pacing  -  There was a left pleural effusion.    Neuro/Psych negative neurological ROS  negative psych ROS   GI/Hepatic Neg liver ROS, GERD-  Medicated,  Endo/Other  Hypothyroidism   Renal/GU negative Renal ROS     Musculoskeletal negative musculoskeletal ROS (+)   Abdominal   Peds  Hematology negative hematology ROS (+)   Anesthesia Other Findings   Reproductive/Obstetrics                        Anesthesia Physical Anesthesia Plan  ASA: III  Anesthesia Plan: General   Post-op Pain Management:    Induction: Intravenous  Airway Management Planned: Oral ETT  Additional Equipment:   Intra-op Plan:   Post-operative Plan: Extubation in OR  Informed Consent: I have reviewed the patients History and Physical, chart, labs and discussed  the procedure including the risks, benefits and alternatives for the proposed anesthesia with the patient or authorized representative who has indicated his/her understanding and acceptance.   Dental advisory given  Plan Discussed with: CRNA  Anesthesia Plan Comments:         Anesthesia Quick Evaluation

## 2012-04-20 NOTE — Anesthesia Procedure Notes (Signed)
Procedure Name: Intubation Date/Time: 04/20/2012 8:48 AM Performed by: Arlice Colt B Pre-anesthesia Checklist: Patient identified, Emergency Drugs available, Suction available, Patient being monitored and Timeout performed Patient Re-evaluated:Patient Re-evaluated prior to inductionOxygen Delivery Method: Circle system utilized Preoxygenation: Pre-oxygenation with 100% oxygen Intubation Type: IV induction Ventilation: Mask ventilation without difficulty Laryngoscope Size: Mac and 3 Grade View: Grade II Tube size: 7.5 mm Number of attempts: 1 Airway Equipment and Method: Stylet and LTA kit utilized Placement Confirmation: ETT inserted through vocal cords under direct vision,  positive ETCO2 and breath sounds checked- equal and bilateral Secured at: 23 cm Tube secured with: Tape Dental Injury: Teeth and Oropharynx as per pre-operative assessment

## 2012-04-20 NOTE — Anesthesia Postprocedure Evaluation (Signed)
Anesthesia Post Note  Patient: [name] [name]  Procedure(s) Performed: Procedure(s) (LRB): EXCISION OF LOWER LIP CANCER AND EXCISION SUBMENTAL LYMPH NODES (N/A)  Anesthesia type: General  Patient location: PACU  Post pain: Pain level controlled  Post assessment: Post-op Vital signs reviewed  Last Vitals: BP 145/73  Pulse 61  Temp(Src) 36.2 C (Oral)  Resp 20  SpO2 93%  Post vital signs: Reviewed  Level of consciousness: sedated  Complications: No apparent anesthesia complications

## 2012-04-20 NOTE — Brief Op Note (Signed)
04/20/2012  11:42 AM  PATIENT:  [name] [name]  79 y.o. male  PRE-OPERATIVE DIAGNOSIS:  Lower lip cancer  POST-OPERATIVE DIAGNOSIS:  Lower lip cancer  PROCEDURE:  Procedure(s): EXCISION OF LOWER LIP CANCER AND EXCISION SUBMENTAL LYMPH NODES (N/A)  SURGEON:  Surgeon(s) and Role:    * [name], MD - Primary  PHYSICIAN ASSISTANT:   ASSISTANTS: none   ANESTHESIA:   general  EBL:  Total I/O In: 1050 [I.V.:1050] Out: -   BLOOD ADMINISTERED:none  DRAINS: Penrose drain in the submental area   LOCAL MEDICATIONS USED:  XYLOCAINE with EPI  SPECIMEN:  Source of Specimen:  submental nodes, left submandibular node, lower lip cancer  DISPOSITION OF SPECIMEN:  PATHOLOGY  COUNTS:  YES  TOURNIQUET:  * No tourniquets in log *  DICTATION: .Other Dictation: Dictation Number [id number]  PLAN OF CARE: Discharge to home after PACU  PATIENT DISPOSITION:  PACU - hemodynamically stable.   Delay start of Pharmacological VTE agent (>24hrs) due to surgical blood loss or risk of bleeding: yes

## 2012-04-20 NOTE — Interval H&P Note (Signed)
History and Physical Interval Note:  04/20/2012 8:29 AM  [name] [name]  has presented today for surgery, with the diagnosis of lower lip cancer  The various methods of treatment have been discussed with the patient and family. After consideration of risks, benefits and other options for treatment, the patient has consented to  Procedure(s): EXCISION OF LOWER LIP CANCER AND EXCISION SUBMENTAL LYMPH NODES (N/A) as a surgical intervention .  The patient's history has been reviewed, patient examined, no change in status, stable for surgery.  I have reviewed the patient's chart and labs.  Questions were answered to the patient's satisfaction.     NEWMAN, CHRISTOPHER

## 2012-04-20 NOTE — Transfer of Care (Signed)
Immediate Anesthesia Transfer of Care Note  Patient: [name] [name]  Procedure(s) Performed: Procedure(s): EXCISION OF LOWER LIP CANCER AND EXCISION SUBMENTAL LYMPH NODES (N/A)  Patient Location: PACU  Anesthesia Type:General  Level of Consciousness: awake, alert  and oriented  Airway & Oxygen Therapy: Patient Spontanous Breathing and Patient connected to nasal cannula oxygen  Post-op Assessment: Report given to PACU RN and Post -op Vital signs reviewed and stable  Post vital signs: Reviewed and stable  Complications: No apparent anesthesia complications

## 2012-04-21 NOTE — Op Note (Signed)
[name] [name]              ACCOUNT NO.:  [id number]  MEDICAL RECORD NO.:  [id number]  LOCATION:  [facility]                         FACILITY:  [facility]  PHYSICIAN:  Kristine Garbe. 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  OPERATION PERFORMED:  Excision of submental lymph nodes and left submandibular node.  Wedge excision of lower lip cancer.  SURGEON:  Kristine Garbe. Ezzard Standing, M.D.  ANESTHESIA:  General endotracheal.  COMPLICATIONS:  None.  BRIEF CLINICAL NOTE:  [name] [name] is an 79 year old gentleman who has had long history of skin cancers status post multiple excisions by his dermatologist.  More recently, had a left lower lip cancer that was excised and ablated by his dermatologist about a month ago.  Several weeks after excision of the left lower lip cancer, he developed a nodule just underneath his chin in the submental area.  On exam in the office, he has about a centimeter midline submental nodule.  Fine needle aspirate was performed and this was consistent with squamous cell carcinoma.  On CT scan, he had several small submental nodes a little more prominent on the left side, extending over into the left submandibular region.  He was taken to the operating room at this time for reexcision of lower lip cancer with a wedge excision along with removal of multiple submental lymph nodes.  DESCRIPTION OF PROCEDURE:  After adequate endotracheal anesthesia, the lower lip in neck was prepped with Betadine solution and draped out with sterile towels.  A horizontal incision was made in the submental area. Dissection was then carried out deep to the platysma muscle to expose the  submental lymph nodes.  There are 1 or 2 firm nodes just underneath the chin.  Dissection was then carried along the floor of the submental area, along the anterior belly of the digastric muscle and the submental triangle was dissected out including the firm submental nodes that were fine needle aspirate, positive for cancer, also had several slightly enlarged lymph nodes extended into the submandibular area that was also included in the specimen.  The specimen was marked with a long suture on the superior submental nodes, medium size suture on the inferior submental nodes and a small suture on the left submental mandibular nodes.  There is 1 additional node on the left side that was removed and sent as a separate specimen as a submandibular nodes on the left. Hemostasis was obtained with the bipolar cautery along with 4-0 silk ligatures on a larger vein.  Defect was irrigated with saline and closed with 4-0 Vicryl suture subcutaneously and a 5-0 nylon to reapproximate the skin edges.  A quarter-inch Penrose drain was brought out through the incision to drain the submental area.  This completed the dissection of the submental and left submandibular nodes.  Next, the previous excision of the lower lip cancer was marked out and a wedge excision of the lower lip was performed.  To include the previous scar, area of the previous excision.  This was sent as a lower lip cancer.  The defect was closed with 4-0 chromic sutures to reapproximate the subcutaneous tissue and musculature.  Bipolar cautery was used to obtain hemostasis of the vessels and the skin was reapproximated with 5-0 nylon suture and the mucosa of the local lower lip was closed with 4-0 chromic suture.  This completed the lower lip wedge excision.  The patient was subsequently awakened from anesthesia and transferred to recovery room, postop doing well.  DISPOSITION:  [name] will be discharged home later this morning on Keflex  500 mg t.i.d. for 1 week.  Tylenol, Motrin, or Vicodin p.r.n. pain.  He was stopped on his Coumadin for 4 days and will restart this in 2 days.  He will continue with his aspirin and other medications.  We will have him follow up in my office tomorrow morning to have the Penrose drain removed and the wound redressed.          ______________________________ Kristine Garbe Ezzard Standing, M.D.     CEN/MEDQ  D:  04/20/2012  T:  04/21/2012  Job:  [id number]

## 2012-04-23 ENCOUNTER — Encounter (HOSPITAL_COMMUNITY): Payer: Self-pay | Admitting: Otolaryngology

## 2012-05-17 ENCOUNTER — Ambulatory Visit (INDEPENDENT_AMBULATORY_CARE_PROVIDER_SITE_OTHER): Payer: Medicare Other | Admitting: Cardiology

## 2012-05-17 ENCOUNTER — Encounter: Payer: Self-pay | Admitting: Cardiology

## 2012-05-17 VITALS — BP 114/60 | HR 84 | Ht 71.0 in | Wt 173.4 lb

## 2012-05-17 DIAGNOSIS — I4891 Unspecified atrial fibrillation: Secondary | ICD-10-CM

## 2012-05-17 DIAGNOSIS — I10 Essential (primary) hypertension: Secondary | ICD-10-CM

## 2012-05-17 DIAGNOSIS — Z950 Presence of cardiac pacemaker: Secondary | ICD-10-CM

## 2012-05-17 DIAGNOSIS — E785 Hyperlipidemia, unspecified: Secondary | ICD-10-CM

## 2012-05-17 NOTE — Patient Instructions (Signed)
Continue your current therapy  I will see you in 6 months.   

## 2012-05-17 NOTE — Progress Notes (Signed)
[name] [name] Date of Birth: Jan 16, 1931   History of Present Illness: [name] [name] is seen today for followup. He states he is doing  well. He denies any chest pain or dizziness. He still has shortness of breath with exertion that is unchanged. He did develop a lesion on his lip that turned out to be squamous cell carcinoma. He had a positive lymph node. He underwent surgical excision by Dr. Ezzard Standing. He has recovered well.  Current Outpatient Prescriptions on File Prior to Visit  Medication Sig Dispense Refill  . allopurinol (ZYLOPRIM) 100 MG tablet Take 100 mg by mouth daily.        Marland Kitchen amLODipine (NORVASC) 5 MG tablet daily.      Marland Kitchen aspirin 81 MG tablet Take 81 mg by mouth daily.        Marland Kitchen atenolol (TENORMIN) 100 MG tablet Take 100 mg by mouth daily.      . benazepril (LOTENSIN) 20 MG tablet Take 20 mg by mouth daily.      . Cholecalciferol (VITAMIN D PO) Take 1 tablet by mouth daily.      . Cyanocobalamin (VITAMIN B 12 PO) Take by mouth daily.      . febuxostat (ULORIC) 40 MG tablet Take 80 mg by mouth daily.      . furosemide (LASIX) 20 MG tablet Take 1 tablet by mouth Daily.      Marland Kitchen HYDROcodone-acetaminophen (NORCO) 5-325 MG per tablet Take 1 tablet by mouth every 4 (four) hours as needed for pain.  24 tablet  0  . lansoprazole (PREVACID) 30 MG capsule Take 30 mg by mouth daily.        Marland Kitchen levothyroxine (SYNTHROID, LEVOTHROID) 100 MCG tablet Take 100 mcg by mouth daily.        . potassium chloride SA (K-DUR,KLOR-CON) 20 MEQ tablet Take 20 mEq by mouth 3 (three) times a week.       . tamsulosin (FLOMAX) 0.4 MG CAPS Take 0.4 mg by mouth daily.      Marland Kitchen warfarin (COUMADIN) 5 MG tablet Take 5 mg by mouth daily.        No current facility-administered medications on file prior to visit.    Allergies  Allergen Reactions  . Codeine Nausea Only    Past Medical History  Diagnosis Date  . Sleep apnea   . Atrial fibrillation   . SSS (sick sinus syndrome)   . Coronary artery disease   .  Hypertension   . Hyperlipidemia   . Pulmonary hypertension   . History of interstitial lung disease   . Diverticulosis   . Esophageal dilatation   . Hypothyroidism   . Gout   . Myocardial infarction   . Pacemaker   . SOB (shortness of breath) on exertion     sees dr. Delton Coombes occassionally  . GERD (gastroesophageal reflux disease)   . Cancer     neck  . Arthritis     neck    Past Surgical History  Procedure Laterality Date  . Cardiac catheterization  08/08/2006    EF 50%  . Cardiac catheterization  07/26/2003    EF 40%  . Insert / replace / remove pacemaker      IMPLANT  . Coronary artery bypass graft  08/2003    LIMA GRAFT TO THE LAD, SAPHENOUS VEIN GRAFT TO THE CIRCUMFLEX, SAPHENOUS VEIN GRAFT TO THE RIGHT POSTERIOR LATERAL BRANCH  . Mitral valve annuloplasty    . Polypectomy    . Cataract extraction    .  Cox-maze microwave ablation    . US echocardiography  07/12/2006    EF 50-55%  . US echocardiography  12/16/2003    EF 55-60%  . Cardiovascular stress test  07/11/2006    EF 50%  . Mass excision N/A 04/20/2012    Procedure: EXCISION OF LOWER LIP CANCER AND EXCISION SUBMENTAL LYMPH NODES;  Surgeon: [name], MD;  Location: [facility] OR;  Service: ENT;  Laterality: N/A;    History  Smoking status  . Former Smoker -- 1.00 packs/day for 15 years  . Types: Cigarettes  . Quit date: 09/20/1980  Smokeless tobacco  . Never Used    History  Alcohol Use  . 1.2 oz/week  . 2 Glasses of wine per week    Comment: 2 glasses of wine daily    Family History  Problem Relation Age of Onset  . Hypertension Mother   . Stroke Father   . Coronary artery disease Brother     Review of Systems: As noted in history of present illness.  All other systems were reviewed and are negative.  Physical Exam: BP 114/60  Pulse 84  Ht 5\' 11"  (1.803 m)  Wt 173 lb 6.4 oz (78.654 kg)  BMI 24.2 kg/m2 The patient is alert and oriented x 3.  The HEENT exam reveals a healed surgical scar  in the submandibular area  The carotids are 2+ without bruits.  There is no thyromegaly.  There is no JVD.  The lungs are clear.    Pacemaker site is normal. The heart exam reveals a regular rate with a normal S1 and S2.  There is a soft systolic murmur at apex.  The PMI is not displaced.   Abdominal exam reveals good bowel sounds.  There is no guarding or rebound.  There is no hepatosplenomegaly or tenderness.  There are no masses.  Exam of the legs reveal no clubbing, cyanosis, or edema.  The legs are without rashes.  The distal pulses are intact.  Cranial nerves II - XII are intact.  Motor and sensory functions are intact.  The gait is normal. LABORATORY DATA: ECG demonstrates atrial sensing and ventricular pacing at a rate of 84 beats per minute.  Assessment / Plan: 1. Coronary disease status post CABG in 2005. Cardiac catheterization 2008 showed patent grafts. He is currently asymptomatic. We will continue therapy with aspirin, atenolol, and amlodipine. Followup again in 6 months.  2. History of complete heart block status post pacemaker implant. He has a followup visit in our pacemaker clinic in the next couple of weeks.  3. Atrial fibrillation. Rate is well controlled. He is on chronic anticoagulation with Coumadin.  4. Interstitial lung disease and pulmonary hypertension. Patient does not want to begin oxygen therapy.  5. 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

## 2012-05-30 ENCOUNTER — Ambulatory Visit (INDEPENDENT_AMBULATORY_CARE_PROVIDER_SITE_OTHER): Payer: Medicare Other | Admitting: Internal Medicine

## 2012-05-30 ENCOUNTER — Encounter: Payer: Self-pay | Admitting: Internal Medicine

## 2012-05-30 VITALS — BP 120/61 | HR 83 | Ht 71.0 in | Wt 174.6 lb

## 2012-05-30 DIAGNOSIS — I4891 Unspecified atrial fibrillation: Secondary | ICD-10-CM

## 2012-05-30 DIAGNOSIS — I2510 Atherosclerotic heart disease of native coronary artery without angina pectoris: Secondary | ICD-10-CM

## 2012-05-30 DIAGNOSIS — Z950 Presence of cardiac pacemaker: Secondary | ICD-10-CM

## 2012-05-30 DIAGNOSIS — I442 Atrioventricular block, complete: Secondary | ICD-10-CM

## 2012-05-30 LAB — PACEMAKER DEVICE OBSERVATION: VENTRICULAR PACING PM: 99

## 2012-05-30 NOTE — Assessment & Plan Note (Signed)
The patient's device was interrogated.  The information was reviewed. No changes were made in the programming.    

## 2012-05-30 NOTE — Assessment & Plan Note (Signed)
Stable on pacer

## 2012-05-30 NOTE — Progress Notes (Signed)
[facility] Patient Care Team: Ezequiel Kayser, MD as PCP - General   HPI  [name] [name] is a 79 y.o. male seen in followup for pacemaker implantation in context of permanent atrial fibrillation    He has a history of bypass surgery in 2005 with prior tricuspid and mitral valve annuloplasty. He underwent catheterization in 2008 because of progressive dyspnea. His bypasses were patent and his left ventricular function was near normal.     Past Medical History  Diagnosis Date  . Sleep apnea   . Atrial fibrillation   . SSS (sick sinus syndrome)   . Coronary artery disease   . Hypertension   . Hyperlipidemia   . Pulmonary hypertension   . History of interstitial lung disease   . Diverticulosis   . Esophageal dilatation   . Hypothyroidism   . Gout   . Myocardial infarction   . Pacemaker   . SOB (shortness of breath) on exertion     sees dr. Delton Coombes occassionally  . GERD (gastroesophageal reflux disease)   . Cancer     neck  . Arthritis     neck    Past Surgical History  Procedure Laterality Date  . Cardiac catheterization  08/08/2006    EF 50%  . Cardiac catheterization  07/26/2003    EF 40%  . Insert / replace / remove pacemaker      IMPLANT  . Coronary artery bypass graft  08/2003    LIMA GRAFT TO THE LAD, SAPHENOUS VEIN GRAFT TO THE CIRCUMFLEX, SAPHENOUS VEIN GRAFT TO THE RIGHT POSTERIOR LATERAL BRANCH  . Mitral valve annuloplasty    . Polypectomy    . Cataract extraction    . Cox-maze microwave ablation    . US echocardiography  07/12/2006    EF 50-55%  . US echocardiography  12/16/2003    EF 55-60%  . Cardiovascular stress test  07/11/2006    EF 50%  . Mass excision N/A 04/20/2012    Procedure: EXCISION OF LOWER LIP CANCER AND EXCISION SUBMENTAL LYMPH NODES;  Surgeon: [name], MD;  Location: [facility] OR;  Service: ENT;  Laterality: N/A;    Current Outpatient Prescriptions  Medication Sig Dispense Refill  . allopurinol (ZYLOPRIM) 100 MG tablet Take 100 mg by  mouth daily.        Marland Kitchen amLODipine (NORVASC) 5 MG tablet daily.      Marland Kitchen aspirin 81 MG tablet Take 81 mg by mouth daily.        Marland Kitchen atenolol (TENORMIN) 100 MG tablet Take 100 mg by mouth daily.      . benazepril (LOTENSIN) 20 MG tablet Take 20 mg by mouth daily.      . Cholecalciferol (VITAMIN D PO) Take 1 tablet by mouth daily.      . Cyanocobalamin (VITAMIN B 12 PO) Take by mouth daily.      . febuxostat (ULORIC) 40 MG tablet Take 80 mg by mouth daily.      . furosemide (LASIX) 20 MG tablet Take 1 tablet by mouth Daily.      . lansoprazole (PREVACID) 30 MG capsule Take 30 mg by mouth daily.        Marland Kitchen levothyroxine (SYNTHROID, LEVOTHROID) 100 MCG tablet Take 100 mcg by mouth daily.        . potassium chloride SA (K-DUR,KLOR-CON) 20 MEQ tablet Take 20 mEq by mouth 3 (three) times a week.       . tamsulosin (FLOMAX) 0.4 MG CAPS Take 0.4 mg  by mouth daily.      Marland Kitchen warfarin (COUMADIN) 5 MG tablet Take 5 mg by mouth daily.        No current facility-administered medications for this visit.    Allergies  Allergen Reactions  . Codeine Nausea Only    Review of Systems negative except from HPI and PMH  Physical Exam BP 120/61  Pulse 83  Ht 5\' 11"  (1.803 m)  Wt 174 lb 9.6 oz (79.198 kg)  BMI 24.36 kg/m2 Well developed and nourished in no acute distress HENT normal Neck supple with JVP-flat Clear Regular rate and rhythm, no murmurs or gallops Abd-soft with active BS No Clubbing cyanosis edema Skin-warm and dry A & Oriented  Grossly normal sensory and motor function     Assessment and  Plan

## 2012-05-30 NOTE — Patient Instructions (Signed)
Your physician wants you to follow-up in: 6 months with the device clinic & 1 year with Dr. Klein. You will receive a reminder letter in the mail two months in advance. If you don't receive a letter, please call our office to schedule the follow-up appointment.  Your physician recommends that you continue on your current medications as directed. Please refer to the Current Medication list given to you today.    

## 2012-05-30 NOTE — Assessment & Plan Note (Signed)
Without symptoms

## 2012-06-12 ENCOUNTER — Other Ambulatory Visit: Payer: Self-pay | Admitting: Dermatology

## 2012-07-03 ENCOUNTER — Other Ambulatory Visit: Payer: Self-pay | Admitting: Dermatology

## 2012-07-17 ENCOUNTER — Ambulatory Visit (INDEPENDENT_AMBULATORY_CARE_PROVIDER_SITE_OTHER): Payer: Medicare Other | Admitting: Pharmacist Clinician (PhC)/ Clinical Pharmacy Specialist

## 2012-07-17 DIAGNOSIS — I4891 Unspecified atrial fibrillation: Secondary | ICD-10-CM

## 2012-07-17 DIAGNOSIS — Z7901 Long term (current) use of anticoagulants: Secondary | ICD-10-CM

## 2012-07-18 ENCOUNTER — Ambulatory Visit (INDEPENDENT_AMBULATORY_CARE_PROVIDER_SITE_OTHER): Payer: Medicare Other | Admitting: Pharmacist Clinician (PhC)/ Clinical Pharmacy Specialist

## 2012-07-18 ENCOUNTER — Ambulatory Visit: Payer: Medicare Other | Admitting: Pharmacist Clinician (PhC)/ Clinical Pharmacy Specialist

## 2012-07-18 DIAGNOSIS — Z7901 Long term (current) use of anticoagulants: Secondary | ICD-10-CM

## 2012-07-18 DIAGNOSIS — I4891 Unspecified atrial fibrillation: Secondary | ICD-10-CM

## 2012-07-25 ENCOUNTER — Other Ambulatory Visit: Payer: Self-pay | Admitting: Otolaryngology

## 2012-07-25 DIAGNOSIS — R599 Enlarged lymph nodes, unspecified: Secondary | ICD-10-CM

## 2012-07-26 ENCOUNTER — Ambulatory Visit
Admission: RE | Admit: 2012-07-26 | Discharge: 2012-07-26 | Disposition: A | Discharge status: Home / Self Care | Payer: Medicare Other | Source: Ambulatory Visit | Attending: Otolaryngology | Admitting: Otolaryngology

## 2012-07-26 DIAGNOSIS — R599 Enlarged lymph nodes, unspecified: Secondary | ICD-10-CM

## 2012-07-26 MED ORDER — IOHEXOL 300 MG/ML  SOLN
45.0000 mL | Freq: Once | INTRAMUSCULAR | Status: AC | PRN
  Administered 2012-07-26: 45 mL via INTRAVENOUS

## 2012-07-31 ENCOUNTER — Ambulatory Visit: Payer: Medicare Other | Admitting: Pharmacist Clinician (PhC)/ Clinical Pharmacy Specialist

## 2012-08-07 ENCOUNTER — Telehealth: Payer: Self-pay | Admitting: Emergency Medicine

## 2012-08-07 NOTE — Telephone Encounter (Signed)
Reviewed case w Dr Ezzard Standing. Mr Gritton needs a neck dissection. We discussed his ILD, probable co-existing COPD, untreated OSA - all make him high risk for a sgy or general anesthesia. Recommended peri-operative BD's. Dr Ezzard Standing will call us in house if we can help post-op

## 2012-08-07 NOTE — Telephone Encounter (Signed)
Spoke with Dr. Delton Coombes.  He will call Dr. Ezzard Standing.  Will forward msg to RB.

## 2012-08-14 ENCOUNTER — Other Ambulatory Visit: Payer: Self-pay | Admitting: Otolaryngology

## 2012-08-14 NOTE — H&P (Signed)
PREOPERATIVE H&P  Chief Complaint: left neck node  HPI: [name] [name] is a 79 y.o. male who presents for evaluation of left neck node. He's 4 months status post excision of left lower lip skin scca and excision of multiple submental nodes with 1 of 8 with metastatic scca. He's been followed closely and recently note a firm 1 cm left submandibular node. CT scan demonstrated slight enlargement of this node consistent with possible metastatic  Disease. Remaining neck nodes unchanged. He's taken to the OR for excisional biopsy and modified left neck dissection if positive for SCCa.  Past Medical History  Diagnosis Date  . Sleep apnea   . Atrial fibrillation   . SSS (sick sinus syndrome)   . Coronary artery disease   . Hypertension   . Hyperlipidemia   . Pulmonary hypertension   . History of interstitial lung disease   . Diverticulosis   . Esophageal dilatation   . Hypothyroidism   . Gout   . Myocardial infarction   . Pacemaker   . SOB (shortness of breath) on exertion     sees dr. Delton Coombes occassionally  . GERD (gastroesophageal reflux disease)   . Cancer     neck  . Arthritis     neck   Past Surgical History  Procedure Laterality Date  . Cardiac catheterization  08/08/2006    EF 50%  . Cardiac catheterization  07/26/2003    EF 40%  . Insert / replace / remove pacemaker      IMPLANT  . Coronary artery bypass graft  08/2003    LIMA GRAFT TO THE LAD, SAPHENOUS VEIN GRAFT TO THE CIRCUMFLEX, SAPHENOUS VEIN GRAFT TO THE RIGHT POSTERIOR LATERAL BRANCH  . Mitral valve annuloplasty    . Polypectomy    . Cataract extraction    . Cox-maze microwave ablation    . US echocardiography  07/12/2006    EF 50-55%  . US echocardiography  12/16/2003    EF 55-60%  . Cardiovascular stress test  07/11/2006    EF 50%  . Mass excision N/A 04/20/2012    Procedure: EXCISION OF LOWER LIP CANCER AND EXCISION SUBMENTAL LYMPH NODES;  Surgeon: [name], MD;  Location: [facility] OR;  Service: ENT;   Laterality: N/A;   History   Social History  . Marital Status: Married    Spouse Name: N/A    Number of Children: N/A  . Years of Education: N/A   Social History Main Topics  . Smoking status: Former Smoker -- 1.00 packs/day for 15 years    Types: Cigarettes    Quit date: 09/20/1980  . Smokeless tobacco: Never Used  . Alcohol Use: 1.2 oz/week    2 Glasses of wine per week     Comment: 2 glasses of wine daily  . Drug Use: No  . Sexually Active: Not on file   Other Topics Concern  . Not on file   Social History Narrative  . No narrative on file   Family History  Problem Relation Age of Onset  . Hypertension Mother   . Stroke Father   . Coronary artery disease Brother    Allergies  Allergen Reactions  . Codeine Nausea Only   Prior to Admission medications   Medication Sig Start Date End Date Taking? Authorizing Provider  allopurinol (ZYLOPRIM) 100 MG tablet Take 100 mg by mouth daily.      Historical Provider, MD  amLODipine (NORVASC) 5 MG tablet daily. 11/07/11   Historical Provider, MD  aspirin 81 MG tablet Take 81 mg by mouth daily.      Historical Provider, MD  atenolol (TENORMIN) 100 MG tablet Take 100 mg by mouth daily.    Historical Provider, MD  benazepril (LOTENSIN) 20 MG tablet Take 20 mg by mouth daily.    Historical Provider, MD  Cholecalciferol (VITAMIN D PO) Take 1 tablet by mouth daily.    Historical Provider, MD  Cyanocobalamin (VITAMIN B 12 PO) Take by mouth daily.    Historical Provider, MD  febuxostat (ULORIC) 40 MG tablet Take 80 mg by mouth daily.    Historical Provider, MD  furosemide (LASIX) 20 MG tablet Take 1 tablet by mouth Daily. 07/07/10   Historical Provider, MD  lansoprazole (PREVACID) 30 MG capsule Take 30 mg by mouth daily.      Historical Provider, MD  levothyroxine (SYNTHROID, LEVOTHROID) 100 MCG tablet Take 100 mcg by mouth daily.      Historical Provider, MD  potassium chloride SA (K-DUR,KLOR-CON) 20 MEQ tablet Take 20 mEq by mouth 3  (three) times a week.     Historical Provider, MD  tamsulosin (FLOMAX) 0.4 MG CAPS Take 0.4 mg by mouth daily.    Historical Provider, MD  warfarin (COUMADIN) 5 MG tablet Take 5 mg by mouth daily.     Historical Provider, MD     Positive ROS: negative  All other systems have been reviewed and were otherwise negative with the exception of those mentioned in the HPI and as above.  Physical Exam: There were no vitals filed for this visit.  General: Alert, no acute distress Oral: Normal oral mucosa and tonsils Nasal: Clear nasal passages Neck: No thyroid nodules. 1 cm firm left submandibular node just anterior to submandibular gland. Ear: Ear canal is clear with normal appearing TMs Cardiovascular: Regular rate and rhythm, no murmur.  Respiratory: Clear to auscultation Neurologic: Alert and oriented x 3   Assessment/Plan: LEFT NECK CANCER Plan for Procedure(s): LEFT NECK DISSECTION   Dillard Cannon, MD 08/14/2012 3:44 PM l

## 2012-08-15 ENCOUNTER — Encounter (HOSPITAL_COMMUNITY): Payer: Self-pay | Admitting: Pharmacy Technician

## 2012-08-16 ENCOUNTER — Encounter (HOSPITAL_COMMUNITY)
Admission: RE | Admit: 2012-08-16 | Discharge: 2012-08-16 | Disposition: A | Discharge status: Home / Self Care | Payer: Medicare Other | Source: Ambulatory Visit | Attending: Otolaryngology | Admitting: Otolaryngology

## 2012-08-16 ENCOUNTER — Encounter (HOSPITAL_COMMUNITY): Payer: Self-pay

## 2012-08-16 LAB — CBC
MCH: 32.5 pg (ref 26.0–34.0)
MCV: 97.6 fL (ref 78.0–100.0)
Platelets: 203 10*3/uL (ref 150–400)
RDW: 13.5 % (ref 11.5–15.5)

## 2012-08-16 LAB — BASIC METABOLIC PANEL
Calcium: 10.2 mg/dL (ref 8.4–10.5)
Creatinine, Ser: 1.45 mg/dL — ABNORMAL HIGH (ref 0.50–1.35)
GFR calc Af Amer: 51 mL/min — ABNORMAL LOW (ref >90)
GFR calc non Af Amer: 44 mL/min — ABNORMAL LOW (ref >90)

## 2012-08-16 LAB — PROTIME-INR: Prothrombin Time: 15.4 seconds — ABNORMAL HIGH (ref 11.6–15.2)

## 2012-08-16 MED ORDER — CEFAZOLIN SODIUM-DEXTROSE 2-3 GM-% IV SOLR
2.0000 g | INTRAVENOUS | Status: AC
  Administered 2012-08-17: 2 g via INTRAVENOUS
  Filled 2012-08-16: qty 50

## 2012-08-16 NOTE — Progress Notes (Signed)
Spoke with [employer] rep Leta Jungling [phone] made aware of planned procedure, time of surgery and orders for PPM from Dr. Graciela Husbands.

## 2012-08-16 NOTE — Progress Notes (Signed)
Anesthesia PAT Evaluation: Patient is an 79 year old male scheduled left neck dissection on 08/17/12 by Dr.Newman.  He is s/p wedge resection of lower lip cancer and excision of submental lymph node that turned out to be SCC with a positive LN on 04/20/2012.   History includes afib, SSS s/p Metronic PPM 08/12/2003, CAD s/p CABG (LIMA to LAD, SVG to CX, SVG to PLA) with tricuspid and mitral valve annuloplasty, modified Cox Maze procedure 08/06/2003, HTN, HLD, hypothyroidism, gout, interstitial lung disease/pulmonary fibrosis diagnosed 2005, former smoker since '82, OSA, carotid artery stenosis (20-39% RICAS, 40-59% LICAS by 12/07/11 duplex; Dr. Hart Rochester). PCP is Dr. Waynard Edwards.  Pulmonologist is Dr. Delton Coombes, last visit 12/2010 with stable fibrosis by CT then and no desaturations. By notes, his biggest recommendation then was to start supplemental O2 if he gets to a point where it is required. Patient was refusing CPAP at that time. Also according to notes, FEV1 on 06/01/09 was at 77% with no desaturation below 90% with walking. Dr. Ezzard Standing did contact Dr. Delton Coombes about plans for surgery.  By notes, Dr. Delton Coombes stated, "Reviewed case w Dr Ezzard Standing. [name] [name] needs a neck dissection. We discussed his ILD, probable co-existing COPD, untreated OSA - all make him high risk for a sgy or general anesthesia. Recommended peri-operative BD's. Dr Ezzard Standing will call us in house if we can help post-op."  Cardiologist is Dr. [place], last visit 05/17/12. He was asymptomatic from a CV standpoint and continued medical therapy was recommended.  Dr. [place] had cleared him for his previous surgery in March 2014 without need for Lovenox bridging while patient was off his Coumadin. EP Cardiologist is Dr. Graciela Husbands, last visit 05/3012.  No changes were made to his device at that time.   EKG on 05/17/12 showed v-paced rhythm.   Echo on 02/25/10 showed moderate LVH, normal LV systolic function, EF 50-55%, normal wall motion, no regional wall motion  abnormalities study was not technically sufficient to allow evaluation of LV diastolic function. Mild aortic regurgitation. Evidence of mitral valve repair. Trivial mitral regurgitation. Mild pulmonic regurgitation. Evidence of tricuspid valve repair. Mild tricuspid regurgitation. Mildly dilated left atrium. PA peak pressure 53 mmHg (S)   Cardiac cath on 08/08/06 showed:  1. Severe three-vessel obstructive atherosclerotic coronary artery disease.  2. All bypass grafts were patent including saphenous vein graft to the distal right coronary artery, saphenous vein graft to the distal circumflex and LIMA graft to the LAD.  3. Low normal left ventricular systolic function.  4. Mild mitral insufficiency.  5. Probable mild degree of mitral valve stenosis. The patient does have large reflected B-wave.  6. Moderate to severe pulmonary hypertension.   Chest CT on 12/20/10 showed:  1. Peripheral and basilar predominant pattern of subpleural  fibrosis is characteristic of usual interstitial pneumonitis (UIP),  without interval change (from 01/08/07).  2. Mediastinal adenopathy is typical of UIP.   CXR on 04/19/12 showed advanced pulmonary fibrosis. No acute abnormality and no change from prior study (12/29/09).   Preoperative labs noted.  His Coumadin has been on hold since 08/10/12.  His PTT is slightly elevated at 41 (previously 48).  He is not on Lovenox/Heparin.  His INR is < 1.5.  I will not plan to order any repeat labs preoperatively.    I spoke with patient during his PAT visit.  He denies any acute cardiopulmonary symptoms.  He feels at his baseline.  He had general and EP cardiology follow-up within the last three months with cardiac clearance prior  to his last surgery in March.  His pulmonologist was notified of plans for surgery and recommended perioperative bronchodilators.  He has not required home oxygen at this point and refuses CPAP.  He is aware that he may require both in the post-operative  period.  If no acute changes then I think he can proceed as planned.  Nursing staff to follow-up on his PPM RX form from [facility].  Patient will be evaluated by his assigned anesthesiologist on the day of surgery to discuss the definitive anesthesia plan.    Velna Ochs [facility] Stay [facility]/Anesthesiology Phone [phone] 08/16/2012 11:29 AM

## 2012-08-16 NOTE — Pre-Procedure Instructions (Signed)
[name] [name]  08/16/2012   Your procedure is scheduled on:  Friday August 17, 2012  Report to [facility] at 0615 AM.  Call this number if you have problems the morning of surgery: [phone]   Remember:   Do not eat food or drink liquids after midnight.   Take these medicines the morning of surgery with A SIP OF WATER: Amlodipine, Atenolol, Prevacid, and. Synthroid. Coumadin was stopped on August 10, 2012.   Do not wear jewelry.  Do not wear lotions, powders, or perfumes. You may wear deodorant.   Men may shave face and neck.  Do not bring valuables to the hospital.  [facility] is not responsible for any belongings and valuable.                    Contacts, dentures or bridgework may not be worn into surgery.  Leave suitcase in the car. After surgery it may be brought to your room.  For patients admitted to the hospital, checkout time is 11:00 AM the day of  discharge.   Patients discharged the day of surgery will not be allowed to drive  home.  Name and phone number of your driver: Wife  Special Instructions: Shower using CHG 2 nights before surgery and the night before surgery.  If you shower the day of surgery use CHG.  Use special wash - you have one bottle of CHG for all showers.  You should use approximately 1/3 of the bottle for each shower.   Please read over the following fact sheets that you were given: Pain Booklet, Coughing and Deep Breathing and Surgical Site Infection Prevention

## 2012-08-17 ENCOUNTER — Inpatient Hospital Stay (HOSPITAL_COMMUNITY)
Admission: RE | Admit: 2012-08-17 | Discharge: 2012-08-19 | DRG: 130 | Disposition: A | Discharge status: Home / Self Care | Payer: Medicare Other | Source: Ambulatory Visit | Attending: Otolaryngology | Admitting: Otolaryngology

## 2012-08-17 ENCOUNTER — Encounter (HOSPITAL_COMMUNITY): Payer: Self-pay | Admitting: *Deleted

## 2012-08-17 ENCOUNTER — Inpatient Hospital Stay (HOSPITAL_COMMUNITY): Payer: Medicare Other | Admitting: Certified Registered Nurse Anesthetist

## 2012-08-17 ENCOUNTER — Encounter (HOSPITAL_COMMUNITY): Payer: Self-pay | Admitting: Vascular Surgery

## 2012-08-17 ENCOUNTER — Encounter (HOSPITAL_COMMUNITY)
Admission: RE | Disposition: A | Discharge status: Home / Self Care | Payer: Self-pay | Source: Ambulatory Visit | Attending: Otolaryngology

## 2012-08-17 DIAGNOSIS — K219 Gastro-esophageal reflux disease without esophagitis: Secondary | ICD-10-CM | POA: Diagnosis present

## 2012-08-17 DIAGNOSIS — E785 Hyperlipidemia, unspecified: Secondary | ICD-10-CM | POA: Diagnosis present

## 2012-08-17 DIAGNOSIS — Z7982 Long term (current) use of aspirin: Secondary | ICD-10-CM

## 2012-08-17 DIAGNOSIS — Z45018 Encounter for adjustment and management of other part of cardiac pacemaker: Secondary | ICD-10-CM

## 2012-08-17 DIAGNOSIS — I4891 Unspecified atrial fibrillation: Secondary | ICD-10-CM | POA: Diagnosis present

## 2012-08-17 DIAGNOSIS — E039 Hypothyroidism, unspecified: Secondary | ICD-10-CM | POA: Diagnosis present

## 2012-08-17 DIAGNOSIS — Z7901 Long term (current) use of anticoagulants: Secondary | ICD-10-CM

## 2012-08-17 DIAGNOSIS — C760 Malignant neoplasm of head, face and neck: Principal | ICD-10-CM | POA: Diagnosis present

## 2012-08-17 DIAGNOSIS — I252 Old myocardial infarction: Secondary | ICD-10-CM

## 2012-08-17 DIAGNOSIS — I10 Essential (primary) hypertension: Secondary | ICD-10-CM | POA: Diagnosis present

## 2012-08-17 HISTORY — PX: RADICAL NECK DISSECTION: SHX2284

## 2012-08-17 SURGERY — DISSECTION, NECK, RADICAL
Anesthesia: General | Site: Neck | Laterality: Left | Wound class: Clean

## 2012-08-17 MED ORDER — ACETAMINOPHEN 10 MG/ML IV SOLN: 1000.0000 mg | Freq: Once | INTRAVENOUS | Status: DC | PRN

## 2012-08-17 MED ORDER — 0.9 % SODIUM CHLORIDE (POUR BTL) OPTIME
TOPICAL | Status: DC | PRN
  Administered 2012-08-17: 1000 mL

## 2012-08-17 MED ORDER — HYDROCODONE-ACETAMINOPHEN 5-325 MG PO TABS
ORAL_TABLET | ORAL | Status: AC
  Filled 2012-08-17: qty 2

## 2012-08-17 MED ORDER — BACITRACIN ZINC 500 UNIT/GM EX OINT
TOPICAL_OINTMENT | CUTANEOUS | Status: AC
  Filled 2012-08-17: qty 15

## 2012-08-17 MED ORDER — ASPIRIN 81 MG PO CHEW
81.0000 mg | CHEWABLE_TABLET | Freq: Every day | ORAL | Status: DC
  Administered 2012-08-18 – 2012-08-19 (×2): 81 mg via ORAL
  Filled 2012-08-17 (×3): qty 1

## 2012-08-17 MED ORDER — BENAZEPRIL HCL 20 MG PO TABS
20.0000 mg | ORAL_TABLET | Freq: Every day | ORAL | Status: DC
  Administered 2012-08-18 – 2012-08-19 (×2): 20 mg via ORAL
  Filled 2012-08-17 (×3): qty 1

## 2012-08-17 MED ORDER — PANTOPRAZOLE SODIUM 20 MG PO TBEC
20.0000 mg | DELAYED_RELEASE_TABLET | Freq: Every day | ORAL | Status: DC
  Administered 2012-08-18 – 2012-08-19 (×2): 20 mg via ORAL
  Filled 2012-08-17 (×3): qty 1

## 2012-08-17 MED ORDER — ATENOLOL 100 MG PO TABS
100.0000 mg | ORAL_TABLET | Freq: Every day | ORAL | Status: DC
  Administered 2012-08-18 – 2012-08-19 (×2): 100 mg via ORAL
  Filled 2012-08-17 (×2): qty 1

## 2012-08-17 MED ORDER — LACTATED RINGERS IV SOLN
INTRAVENOUS | Status: DC | PRN
  Administered 2012-08-17 (×2): via INTRAVENOUS

## 2012-08-17 MED ORDER — BACITRACIN ZINC 500 UNIT/GM EX OINT
TOPICAL_OINTMENT | CUTANEOUS | Status: DC | PRN
  Administered 2012-08-17: 1 via TOPICAL

## 2012-08-17 MED ORDER — HYDROMORPHONE HCL PF 1 MG/ML IJ SOLN
0.2500 mg | INTRAMUSCULAR | Status: DC | PRN
  Administered 2012-08-17 (×2): 0.5 mg via INTRAVENOUS

## 2012-08-17 MED ORDER — HYDROMORPHONE HCL PF 1 MG/ML IJ SOLN
INTRAMUSCULAR | Status: AC
  Filled 2012-08-17: qty 1

## 2012-08-17 MED ORDER — ONDANSETRON HCL 4 MG/2ML IJ SOLN
INTRAMUSCULAR | Status: DC | PRN
  Administered 2012-08-17: 4 mg via INTRAVENOUS

## 2012-08-17 MED ORDER — LIDOCAINE-EPINEPHRINE 1 %-1:100000 IJ SOLN
INTRAMUSCULAR | Status: AC
  Filled 2012-08-17: qty 1

## 2012-08-17 MED ORDER — HYDROCODONE-ACETAMINOPHEN 5-325 MG PO TABS
1.0000 | ORAL_TABLET | ORAL | Status: DC | PRN
  Administered 2012-08-17 – 2012-08-19 (×5): 2 via ORAL
  Filled 2012-08-17 (×4): qty 2

## 2012-08-17 MED ORDER — ONDANSETRON HCL 4 MG PO TABS
4.0000 mg | ORAL_TABLET | ORAL | Status: DC | PRN
  Administered 2012-08-18: 4 mg via ORAL
  Filled 2012-08-17: qty 1

## 2012-08-17 MED ORDER — ROCURONIUM BROMIDE 100 MG/10ML IV SOLN
INTRAVENOUS | Status: DC | PRN
  Administered 2012-08-17: 40 mg via INTRAVENOUS

## 2012-08-17 MED ORDER — FUROSEMIDE 20 MG PO TABS
20.0000 mg | ORAL_TABLET | Freq: Every day | ORAL | Status: DC
  Administered 2012-08-18 – 2012-08-19 (×2): 20 mg via ORAL
  Filled 2012-08-17 (×3): qty 1

## 2012-08-17 MED ORDER — LIDOCAINE HCL 4 % MT SOLN
OROMUCOSAL | Status: DC | PRN
  Administered 2012-08-17: 4 mL via TOPICAL

## 2012-08-17 MED ORDER — BACITRACIN ZINC 500 UNIT/GM EX OINT
1.0000 "application " | TOPICAL_OINTMENT | Freq: Three times a day (TID) | CUTANEOUS | Status: DC
  Administered 2012-08-17 – 2012-08-18 (×3): 1 via TOPICAL
  Filled 2012-08-17: qty 15

## 2012-08-17 MED ORDER — LIDOCAINE HCL (CARDIAC) 20 MG/ML IV SOLN
INTRAVENOUS | Status: DC | PRN
  Administered 2012-08-17: 50 mg via INTRAVENOUS

## 2012-08-17 MED ORDER — LACTATED RINGERS IV SOLN: INTRAVENOUS | Status: DC

## 2012-08-17 MED ORDER — KCL IN DEXTROSE-NACL 20-5-0.45 MEQ/L-%-% IV SOLN
INTRAVENOUS | Status: DC
  Administered 2012-08-17 – 2012-08-19 (×2): via INTRAVENOUS
  Filled 2012-08-17 (×6): qty 1000

## 2012-08-17 MED ORDER — ONDANSETRON HCL 4 MG/2ML IJ SOLN: 4.0000 mg | Freq: Once | INTRAMUSCULAR | Status: DC | PRN

## 2012-08-17 MED ORDER — KCL IN DEXTROSE-NACL 20-5-0.45 MEQ/L-%-% IV SOLN
INTRAVENOUS | Status: AC
  Filled 2012-08-17: qty 1000

## 2012-08-17 MED ORDER — POTASSIUM CHLORIDE CRYS ER 20 MEQ PO TBCR
20.0000 meq | EXTENDED_RELEASE_TABLET | ORAL | Status: DC
  Filled 2012-08-17: qty 1

## 2012-08-17 MED ORDER — LEVOTHYROXINE SODIUM 100 MCG PO TABS: 100.0000 ug | ORAL_TABLET | Freq: Every day | ORAL | Status: DC

## 2012-08-17 MED ORDER — FENTANYL CITRATE 0.05 MG/ML IJ SOLN
INTRAMUSCULAR | Status: DC | PRN
  Administered 2012-08-17: 100 ug via INTRAVENOUS
  Administered 2012-08-17 (×3): 50 ug via INTRAVENOUS
  Administered 2012-08-17: 100 ug via INTRAVENOUS
  Administered 2012-08-17: 50 ug via INTRAVENOUS

## 2012-08-17 MED ORDER — LEVOTHYROXINE SODIUM 100 MCG PO TABS
100.0000 ug | ORAL_TABLET | Freq: Every day | ORAL | Status: DC
  Administered 2012-08-18 – 2012-08-19 (×2): 100 ug via ORAL
  Filled 2012-08-17 (×4): qty 1

## 2012-08-17 MED ORDER — ACETAMINOPHEN 160 MG/5ML PO SOLN
650.0000 mg | ORAL | Status: DC | PRN
  Filled 2012-08-17: qty 20.3

## 2012-08-17 MED ORDER — PHENYLEPHRINE HCL 10 MG/ML IJ SOLN
INTRAMUSCULAR | Status: DC | PRN
  Administered 2012-08-17 (×2): 40 ug via INTRAVENOUS
  Administered 2012-08-17: 80 ug via INTRAVENOUS
  Administered 2012-08-17: 40 ug via INTRAVENOUS
  Administered 2012-08-17: 80 ug via INTRAVENOUS

## 2012-08-17 MED ORDER — ACETAMINOPHEN 650 MG RE SUPP: 650.0000 mg | RECTAL | Status: DC | PRN

## 2012-08-17 MED ORDER — MORPHINE SULFATE 2 MG/ML IJ SOLN
2.0000 mg | INTRAMUSCULAR | Status: DC | PRN
Start: 2012-08-17 — End: 2012-08-19
  Administered 2012-08-17: 2 mg via INTRAVENOUS
  Filled 2012-08-17: qty 1

## 2012-08-17 MED ORDER — PHENYLEPHRINE HCL 10 MG/ML IJ SOLN
10.0000 mg | INTRAVENOUS | Status: DC | PRN
  Administered 2012-08-17: 20 ug/min via INTRAVENOUS

## 2012-08-17 MED ORDER — PROPOFOL 10 MG/ML IV BOLUS
INTRAVENOUS | Status: DC | PRN
  Administered 2012-08-17: 130 mg via INTRAVENOUS
  Administered 2012-08-17: 70 mg via INTRAVENOUS

## 2012-08-17 MED ORDER — FEBUXOSTAT 40 MG PO TABS
40.0000 mg | ORAL_TABLET | Freq: Every day | ORAL | Status: DC
  Administered 2012-08-18 – 2012-08-19 (×2): 40 mg via ORAL
  Filled 2012-08-17 (×3): qty 1

## 2012-08-17 MED ORDER — ALLOPURINOL 100 MG PO TABS
100.0000 mg | ORAL_TABLET | Freq: Every day | ORAL | Status: DC
  Administered 2012-08-18 – 2012-08-19 (×2): 100 mg via ORAL
  Filled 2012-08-17 (×2): qty 1

## 2012-08-17 MED ORDER — LIDOCAINE-EPINEPHRINE 1 %-1:100000 IJ SOLN
INTRAMUSCULAR | Status: DC | PRN
  Administered 2012-08-17: 6 mL

## 2012-08-17 MED ORDER — CEFAZOLIN SODIUM 1-5 GM-% IV SOLN
1.0000 g | Freq: Three times a day (TID) | INTRAVENOUS | Status: DC
  Administered 2012-08-17 – 2012-08-19 (×6): 1 g via INTRAVENOUS
  Filled 2012-08-17 (×8): qty 50

## 2012-08-17 MED ORDER — TAMSULOSIN HCL 0.4 MG PO CAPS
0.4000 mg | ORAL_CAPSULE | Freq: Every day | ORAL | Status: DC
  Administered 2012-08-18 – 2012-08-19 (×2): 0.4 mg via ORAL
  Filled 2012-08-17 (×3): qty 1

## 2012-08-17 MED ORDER — AMLODIPINE BESYLATE 5 MG PO TABS
5.0000 mg | ORAL_TABLET | Freq: Every day | ORAL | Status: DC
  Administered 2012-08-18 – 2012-08-19 (×2): 5 mg via ORAL
  Filled 2012-08-17 (×2): qty 1

## 2012-08-17 MED ORDER — ONDANSETRON HCL 4 MG/2ML IJ SOLN
4.0000 mg | INTRAMUSCULAR | Status: DC | PRN
  Administered 2012-08-18 – 2012-08-19 (×2): 4 mg via INTRAVENOUS
  Filled 2012-08-17 (×2): qty 2

## 2012-08-17 SURGICAL SUPPLY — 59 items
ATTRACTOMAT 16X20 MAGNETIC DRP (DRAPES) ×2 IMPLANT
BLADE SURG 10 STRL SS (BLADE) ×2 IMPLANT
BLADE SURG ROTATE 9660 (MISCELLANEOUS) IMPLANT
CANISTER SUCTION 2500CC (MISCELLANEOUS) ×2 IMPLANT
CLEANER TIP ELECTROSURG 2X2 (MISCELLANEOUS) ×2 IMPLANT
CLOTH BEACON ORANGE TIMEOUT ST (SAFETY) ×2 IMPLANT
CONT SPEC 4OZ CLIKSEAL STRL BL (MISCELLANEOUS) ×4 IMPLANT
CORDS BIPOLAR (ELECTRODE) ×2 IMPLANT
COVER SURGICAL LIGHT HANDLE (MISCELLANEOUS) ×2 IMPLANT
DECANTER SPIKE VIAL GLASS SM (MISCELLANEOUS) ×2 IMPLANT
DRAIN SNY 7 FPER (WOUND CARE) ×2 IMPLANT
ELECT COATED BLADE 2.86 ST (ELECTRODE) ×2 IMPLANT
ELECT REM PT RETURN 9FT ADLT (ELECTROSURGICAL) ×2
ELECTRODE REM PT RTRN 9FT ADLT (ELECTROSURGICAL) ×1 IMPLANT
EVACUATOR SILICONE 100CC (DRAIN) ×2 IMPLANT
GAUZE SPONGE 4X4 16PLY XRAY LF (GAUZE/BANDAGES/DRESSINGS) ×4 IMPLANT
GLOVE BIO SURGEON STRL SZ 6.5 (GLOVE) ×2 IMPLANT
GLOVE BIOGEL PI IND STRL 6.5 (GLOVE) ×1 IMPLANT
GLOVE BIOGEL PI IND STRL 7.0 (GLOVE) ×3 IMPLANT
GLOVE BIOGEL PI IND STRL 7.5 (GLOVE) ×1 IMPLANT
GLOVE BIOGEL PI INDICATOR 6.5 (GLOVE) ×1
GLOVE BIOGEL PI INDICATOR 7.0 (GLOVE) ×3
GLOVE BIOGEL PI INDICATOR 7.5 (GLOVE) ×1
GLOVE ECLIPSE 7.0 STRL STRAW (GLOVE) ×2 IMPLANT
GLOVE ECLIPSE 7.5 STRL STRAW (GLOVE) ×4 IMPLANT
GLOVE SS BIOGEL STRL SZ 7.5 (GLOVE) ×1 IMPLANT
GLOVE SUPERSENSE BIOGEL SZ 7.5 (GLOVE) ×1
GLOVE SURG SS PI 6.5 STRL IVOR (GLOVE) ×2 IMPLANT
GLOVE SURG SS PI 7.0 STRL IVOR (GLOVE) ×2 IMPLANT
GOWN PREVENTION PLUS XLARGE (GOWN DISPOSABLE) IMPLANT
GOWN STRL NON-REIN LRG LVL3 (GOWN DISPOSABLE) ×12 IMPLANT
KIT BASIN OR (CUSTOM PROCEDURE TRAY) ×2 IMPLANT
KIT ROOM TURNOVER OR (KITS) ×2 IMPLANT
LOCATOR NERVE 3 VOLT (DISPOSABLE) ×2 IMPLANT
MARKER SKIN DUAL TIP RULER LAB (MISCELLANEOUS) ×2 IMPLANT
NS IRRIG 1000ML POUR BTL (IV SOLUTION) ×2 IMPLANT
PAD ARMBOARD 7.5X6 YLW CONV (MISCELLANEOUS) ×4 IMPLANT
PENCIL BUTTON HOLSTER BLD 10FT (ELECTRODE) ×2 IMPLANT
SPECIMEN JAR MEDIUM (MISCELLANEOUS) IMPLANT
SPONGE GAUZE 4X4 12PLY (GAUZE/BANDAGES/DRESSINGS) ×2 IMPLANT
SPONGE INTESTINAL PEANUT (DISPOSABLE) ×2 IMPLANT
SPONGE LAP 18X18 X RAY DECT (DISPOSABLE) IMPLANT
STAPLER VISISTAT 35W (STAPLE) ×2 IMPLANT
SUCTION HEMOVAC SNY HYST (SUCTIONS) IMPLANT
SUT CHROMIC 3 0 PS 2 (SUTURE) ×2 IMPLANT
SUT CHROMIC GUT 2 0 PS 2 27 (SUTURE) ×2 IMPLANT
SUT ETHILON 5 0 P 3 18 (SUTURE) ×2
SUT NYLON ETHILON 5-0 P-3 1X18 (SUTURE) ×2 IMPLANT
SUT SILK 2 0 (SUTURE) ×1
SUT SILK 2 0 FS (SUTURE) ×2 IMPLANT
SUT SILK 2 0 SH CR/8 (SUTURE) ×2 IMPLANT
SUT SILK 2-0 18XBRD TIE 12 (SUTURE) ×1 IMPLANT
SUT SILK 3 0 (SUTURE) ×2
SUT SILK 3-0 18XBRD TIE 12 (SUTURE) ×2 IMPLANT
TOWEL OR 17X24 6PK STRL BLUE (TOWEL DISPOSABLE) ×2 IMPLANT
TOWEL OR 17X26 10 PK STRL BLUE (TOWEL DISPOSABLE) ×2 IMPLANT
TRAY ENT MC OR (CUSTOM PROCEDURE TRAY) ×2 IMPLANT
TRAY FOLEY CATH 14FRSI W/METER (CATHETERS) ×2 IMPLANT
WATER STERILE IRR 1000ML POUR (IV SOLUTION) IMPLANT

## 2012-08-17 NOTE — Anesthesia Postprocedure Evaluation (Signed)
  Anesthesia Post-op Note  Patient: [name] [name]  Procedure(s) Performed: Procedure(s): LEFT NECK DISSECTION (Left)  Patient Location: PACU  Anesthesia Type:General  Level of Consciousness: awake, alert  and oriented  Airway and Oxygen Therapy: Patient Spontanous Breathing and Patient connected to nasal cannula oxygen  Post-op Pain: mild  Post-op Assessment: Post-op Vital signs reviewed, Patient's Cardiovascular Status Stable, Respiratory Function Stable, Patent Airway and Pain level controlled  Post-op Vital Signs: stable  Complications: No apparent anesthesia complications

## 2012-08-17 NOTE — Progress Notes (Signed)
Post Op check VSS    Sats 95 No breathing difficulty. Complains of feeling weak and sore throat Neck wound looks good. JP 45cc since surgeryl Stable post op course

## 2012-08-17 NOTE — Brief Op Note (Signed)
08/17/2012  12:42 PM  PATIENT:  [name] [name]  79 y.o. male  PRE-OPERATIVE DIAGNOSIS:  LEFT NECK CANCER  POST-OPERATIVE DIAGNOSIS:  LEFT NECK CANCER  PROCEDURE:  Procedure(s): LEFT NECK DISSECTION (Left)  SURGEON:  Surgeon(s) and Role:    * [name], MD - Primary  PHYSICIAN ASSISTANT:   ASSISTANTS: none   ANESTHESIA:   general  EBL:  Total I/O In: 1500 [I.V.:1500] Out: 250 [Urine:200; Blood:50]  BLOOD ADMINISTERED:none  DRAINS: (10) Jackson-Pratt drain(s) with closed bulb suction in the left neck   LOCAL MEDICATIONS USED:  NONE  SPECIMEN:  Source of Specimen:  left neck specimen  DISPOSITION OF SPECIMEN:  PATHOLOGY  COUNTS:  YES  TOURNIQUET:  * No tourniquets in log *  DICTATION: .Other Dictation: Dictation Number [id number]  PLAN OF CARE: Admit to inpatient   PATIENT DISPOSITION:  PACU - hemodynamically stable.   Delay start of Pharmacological VTE agent (>24hrs) due to surgical blood loss or risk of bleeding: yes

## 2012-08-17 NOTE — Anesthesia Preprocedure Evaluation (Addendum)
Anesthesia Evaluation  Patient identified by MRN, date of birth, ID band Patient awake    Reviewed: Allergy & Precautions, H&P , NPO status , Patient's Chart, lab work & pertinent test results, reviewed documented beta blocker date and time   History of Anesthesia Complications (+) PONV  Airway Mallampati: II TM Distance: >3 FB Neck ROM: Full    Dental  (+) Teeth Intact, Dental Advisory Given and Partial Upper   Pulmonary sleep apnea , former smoker,  Interstitial lung disease. breath sounds clear to auscultation        Cardiovascular hypertension, Pt. on medications and Pt. on home beta blockers + CAD, + Past MI and + CABG + dysrhythmias + pacemaker Rhythm:Regular Rate:Normal  2005 CABG Dr Cornelius Moras Medtronic pacer 2005.   Neuro/Psych    GI/Hepatic Neg liver ROS, GERD-  Medicated and Controlled,  Endo/Other    Renal/GU negative Renal ROS     Musculoskeletal   Abdominal   Peds  Hematology negative hematology ROS (+)   Anesthesia Other Findings   Reproductive/Obstetrics negative OB ROS                        Anesthesia Physical Anesthesia Plan  ASA: III  Anesthesia Plan: General   Post-op Pain Management:    Induction: Intravenous  Airway Management Planned: Oral ETT  Additional Equipment: Arterial line  Intra-op Plan:   Post-operative Plan: Extubation in OR  Informed Consent: I have reviewed the patients History and Physical, chart, labs and discussed the procedure including the risks, benefits and alternatives for the proposed anesthesia with the patient or authorized representative who has indicated his/her understanding and acceptance.   Dental advisory given  Plan Discussed with: CRNA and Anesthesiologist  Anesthesia Plan Comments: (Chronic Atrial fibrillation with VVI Pacemaker CAD S/P CABG, maze and mitral valve repair 2005, no angina Pulmonary fibrosis SOB with  exertion Gout sleep apnea GERD   Plan GA with art line and oral ETT  Kipp Brood, MD  Plan GA with oral ETT and art line)        Anesthesia Quick Evaluation

## 2012-08-17 NOTE — Anesthesia Procedure Notes (Signed)
Date/Time: 08/17/2012 9:02 AM Performed by: Arlice Colt B Pre-anesthesia Checklist: Patient identified, Emergency Drugs available, Suction available, Patient being monitored and Timeout performed Patient Re-evaluated:Patient Re-evaluated prior to inductionOxygen Delivery Method: Circle system utilized Preoxygenation: Pre-oxygenation with 100% oxygen Intubation Type: IV induction Ventilation: Mask ventilation without difficulty Laryngoscope Size: Mac and 4 Grade View: Grade I Tube type: Oral Tube size: 7.5 mm Number of attempts: 1 Airway Equipment and Method: Stylet Placement Confirmation: ETT inserted through vocal cords under direct vision,  positive ETCO2 and breath sounds checked- equal and bilateral Secured at: 22 cm Tube secured with: Tape Dental Injury: Teeth and Oropharynx as per pre-operative assessment

## 2012-08-17 NOTE — Preoperative (Signed)
Beta Blockers   Reason not to administer Beta Blockers:Not Applicable 

## 2012-08-17 NOTE — Op Note (Signed)
NAMEBILLAL, ROLLO              ACCOUNT NO.:  [id number]  MEDICAL RECORD NO.:  [id number]  LOCATION:  [facility]                         FACILITY:  [facility]  PHYSICIAN:  Kristine Garbe. 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  OPERATION PERFORMED:  Left neck node biopsy with left modified neck dissection.  SURGEON:  Kristine Garbe. Ezzard Standing, MD  ANESTHESIA:  General endotracheal.  COMPLICATIONS:  None.  ESTIMATED BLOOD LOSS:  80 mL.  BRIEF CLINICAL NOTE:  Oziel Beitler is a 79 year old gentleman, who has had a previous left lower lip cancer removed about 4 months ago and subsequently developed a submental node that was needle aspirate positive for squamous cell carcinoma about 3 months ago.  He underwent a limited dissection of the submental nodes at that time and had 1 of 8 submental lymph nodes positive for cancer.  He was observed closely and on recent followup about a month ago, he was noted to have enlarged left submandibular node.  CT scan showed the node had increased in size and was suspicious for metastatic cancer.  He is taken to operating room at this time for left neck node biopsy and probable left neck dissection as this was positive for cancer.  I explained the surgery to him, he accepted the risk and he was taken to operating room at this time for left neck node biopsy and left neck dissection.  DESCRIPTION OF PROCEDURE:  After adequate endotracheal anesthesia, the patient received 2 g Ancef IV preoperatively.  Left neck was prepped with Betadine solution and draped out with sterile towels.  First, a horizontal incision was made, mid left neck just below the submandibular gland and subplatysmal flaps were elevated  superiorly.  There was a firm node just below the margin of the mandible, anterior portion of the submandibular region that was firm to palpation.  This was carefully dissected out and sent for frozen section.  Of note, the patient had another slightly enlarged node little posterior and inferior to this, just behind and inferior to the submandibular gland that was little suspicious.  The node that was sent for frozen section revealed metastatic squamous cell carcinoma.  At this point, modified neck dissection was performed.  Another couple of small lymph nodes in the submandibular gland were removed from the submandibular fossa.  The larger lymph node just posterior superior to the submandibular gland was included in the specimen that was included with the neck.  Facial branches were ligated with 2-0 and 3-0 silk sutures.  The facial artery was preserved.  Dissection was then carried along the digastric muscle superiorly and the jugular vein, carotid artery, and vagus nerve were identified just beneath at the level of the posterior belly of digastric muscle.  Dissection was carried down in the anterior margin of the sternocleidomastoid muscle.  The accessory nerve was identified and preserved.  The accessory lymph nodes down the posterior neck were not dissected out.  Dissection was then carried  along the jugular vein and carotid artery inferiorly, preserving the vein artery and vagus nerve which was preserved, the 12th nerve was identified and preserved. Dissection was carried out inferiorly down to just below the level of the omohyoid muscle.  Hemostasis was obtained with 3-0 silk ligatures and bipolar cautery.  Specimen was marked with a small suture anteriorly superiorly in the submandibular region.  A 2nd suture which was medium length about 3-4 cm marked the superior aspect of the mid jugular dissection just below the posterior belly of the digastric muscle and then a very long  suture marked the inferior aspect of the dissection. The specimen was sent to Pathology.  After obtaining adequate hemostasis, a JP drain was brought out through a inferior incision in the neck, secured with a 3-0 silk suture to the skin.  The skin was then closed with 3-0 chromic sutures subcutaneously and 5-0 nylon to reapproximate the skin edges.  The patient was subsequently awoke from anesthesia and transferred to recovery room, postop doing well.  DISPOSITION:  The patient will be admitted to step-down unit for observation and we will plan on discharge in 2-3 days.          ______________________________ Kristine Garbe Ezzard Standing, M.D.     CEN/MEDQ  D:  08/17/2012  T:  08/17/2012  Job:  [id number]  cc:   Loraine Leriche A. Perini, M.D.

## 2012-08-17 NOTE — Transfer of Care (Signed)
Immediate Anesthesia Transfer of Care Note  Patient: [name] [name]  Procedure(s) Performed: Procedure(s): LEFT NECK DISSECTION (Left)  Patient Location: PACU  Anesthesia Type:General  Level of Consciousness: awake, alert , oriented and patient cooperative  Airway & Oxygen Therapy: Patient Spontanous Breathing and Patient connected to face mask oxygen  Post-op Assessment: Report given to PACU RN and Post -op Vital signs reviewed and stable  Post vital signs: Reviewed and stable  Complications: No apparent anesthesia complications

## 2012-08-18 MED ORDER — WARFARIN SODIUM 5 MG PO TABS
5.0000 mg | ORAL_TABLET | Freq: Once | ORAL | Status: AC
  Administered 2012-08-18: 5 mg via ORAL
  Filled 2012-08-18: qty 1

## 2012-08-18 MED ORDER — MENTHOL 3 MG MT LOZG
1.0000 | LOZENGE | OROMUCOSAL | Status: DC | PRN
  Filled 2012-08-18: qty 9

## 2012-08-18 MED ORDER — WARFARIN - PHYSICIAN DOSING INPATIENT: Freq: Every day | Status: DC

## 2012-08-18 NOTE — Progress Notes (Signed)
PHARMACIST - PHYSICIAN COMMUNICATION DR:  Dillard Cannon CONCERNING: Pharmacy Care Issues Regarding Warfarin Labs  RECOMMENDATION (Action Taken): A daily protime has been ordered to meet the [facility] National Patient safety goal and comply with the current [facility] Pharmacy & Therapeutics Committee policy.   The Pharmacy will defer all warfarin dose order changes and follow up of lab results to the prescriber unless an additional order to initiate a "pharmacy Coumadin consult" is placed.  DESCRIPTION:  While hospitalized, to be in compliance with [employer] National Patient Safety Goals, all patients on warfarin must have a baseline and/or current protime prior to the administration of warfarin. Pharmacy has received your order for warfarin without these required laboratory assessments.  Anabel Bene, PharmD Clinical Pharmacist Pager: [phone]

## 2012-08-18 NOTE — Progress Notes (Signed)
POD 1 AF VSS JP output is minimal. Neck without swelling. C/o sorethroat Plan to transfer patient. Restart coumadin and increase activity. D/C foley Probable discharge in AM Stable post op course

## 2012-08-19 LAB — PROTIME-INR: INR: 1.3 (ref 0.00–1.49)

## 2012-08-19 MED ORDER — HYDROCODONE-ACETAMINOPHEN 5-325 MG PO TABS: 1.0000 | ORAL_TABLET | ORAL | Status: DC | PRN

## 2012-08-19 NOTE — Discharge Summary (Signed)
NAMEJAQUAVIUS, HUDLER              ACCOUNT NO.:  [id number]  MEDICAL RECORD NO.:  [id number]  LOCATION:  5N30C                        FACILITY:  [facility]  PHYSICIAN:  Kristine Garbe. Ezzard Standing, M.D.DATE OF BIRTH:  May 29, 1930  DATE OF ADMISSION:  08/17/2012 DATE OF DISCHARGE:  08/19/2012                              DISCHARGE SUMMARY   DIAGNOSES: 1. 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 2. Coronary artery disease with history of atrial fibrillation and     pacemaker. 3. Hypertension. 4. Chronic pulmonary disease.  OPERATIONS AND PROCEDURES DURING THIS HOSPITALIZATION:  Left modified neck dissection on August 17, 2012.  HOSPITAL COURSE:  [name] [name] is an 79 year old gentleman with metastatic lower lip squamous cell carcinoma to the left neck.  He was admitted via the operating room on August 17, 2012, at which time he underwent a left modified neck dissection.  Postoperatively, he was admitted.  He initially was monitored in step-down unit because of history of cardiac and pulmonary disease.  He received perioperative Ancef.  He remained afebrile.  Vital signs were stable throughout hospital course.  His Jackson-Pratt drain was removed on his second postoperative day after minimal drainage and output and the patient was subsequently discharged home on his second postoperative day.  At time of discharge, he was afebrile. Vital signs were stable.  The left neck wound was doing well with minimal swelling. Drain had been removed.  He had been on Coumadin and stopped this 5 days preoperatively and restarted on Coumadin the day before discharge.  The patient will restart all his regular medications in addition to Tylenol and Vicodin p.r.n. pain.  We will have him follow up in my office in 8 days for recheck and have sutures removed and review of final pathology.  FINAL DIAGNOSES: 1. Metastatic squamous cell carcinoma to left neck nodes. 2. Coronary artery disease  with history of atrial fibrillation and use     of pacemaker. 3. Chronic pulmonary disease with interstitial lung disease.          ______________________________ Kristine Garbe. Ezzard Standing, M.D.     CEN/MEDQ  D:  08/19/2012  T:  08/19/2012  Job:  [id number]

## 2012-08-19 NOTE — Progress Notes (Signed)
POD 2 AF VSS JP drain minimal output. Removed. Neck doing well without swelling. Mild pain left neck. Discharge home on home meds and vicodin prn pain. Follow up my office in 8 days.

## 2012-08-20 ENCOUNTER — Encounter (HOSPITAL_COMMUNITY): Payer: Self-pay | Admitting: Otolaryngology

## 2012-08-20 NOTE — Progress Notes (Signed)
Utilization review completed. Maurissa Ambrose RN CCM Case Mgmt phone [phone] 

## 2012-09-14 ENCOUNTER — Telehealth: Payer: Self-pay | Admitting: *Deleted

## 2012-09-14 ENCOUNTER — Encounter: Payer: Self-pay | Admitting: Radiation Oncology

## 2012-09-14 DIAGNOSIS — C801 Malignant (primary) neoplasm, unspecified: Secondary | ICD-10-CM | POA: Insufficient documentation

## 2012-09-14 NOTE — Progress Notes (Signed)
Head and Neck Cancer Location of Tumor / Histology:  Lower lip,  Left neck  Patient presented months ago with symptoms of: March 2014  noticed new nodule underneath his chin  Biopsies 08/17/12: of left neck revealed: Diagnosis1. Lymph node, biopsy, left neck- ONE LYMPH NODE POSITIVE FOR METASTATIC SQUAMOUS CELL CARCINOMA (1/1).- SEE COMMENT.2. Lymph node, biopsy, Left neck submandibular- ONE BENIGN LYMPH NODE WITH NO TUMOR SEEN (0/1).3. Lymph nodes, radical neck dissection, Left modified- BENIGN SALIVARY GLAND TISSUE.- SIX BENIGN LYMPH NODES WITH NO TUMOR SEEN (0/6).  Nutrition Status:  Weight changes: 4 lb weight loss  Swallowing status: no Plans, if any, for PEG tube: Tobacco/Marijuana/Snuff/ETOH use:  Smoked 1ppd cigarettes  15 years,quit smoking 1982 Past/Anticipated interventions by otolaryngology, if any:HX : SCCA of skin lower lip and s/p excision and ablation of lower lip cancer by Dr. Terri Piedra 03/05/12. 04/20/12 excision of lower lip cancer and excision submental lymph nodes Dr. Dillard Cannon  Past/Anticipated interventions by medical oncology, if any: No  Referrals yet, to any of the following?  Social Work?no  Dentistry? NO  Swallowing therapy? NO  Nutrition? NO  Med/Onc? No  PEG placement? No  SAFETY ISSUES:  Prior radiation? NO  Pacemaker-yes, 08/12/2003 dual chamber pacemaker, Dr.Stanley Tennant, retired, gets checked by Dr.Klein office    Is the patient on methotrexate? NO  Current Complaints / other details:   Chronic pulmonary disease with interstitial  lung disease [place],CAD,hx A-Fib, Cardiac Catherization 08/2003,mitral valve annuloplasty, polypectomy, Cox-maze microwave ablation  Married, 3 children, 6 grandchildren.  Reports that he had a pain in his left neck that he thought was from arthritis.  After his surgery, the pain is gone.

## 2012-09-14 NOTE — Telephone Encounter (Signed)
Called patient home, left voice message  Asking about status of patient's pacemaker ,asked to call us back know he has a consult this Tuesday 1:29 PM

## 2012-09-17 ENCOUNTER — Telehealth: Payer: Self-pay | Admitting: *Deleted

## 2012-09-17 NOTE — Telephone Encounter (Signed)
Called pt to introduce myself and to let him know I would be meeting him tomorrow during his appt with Dr. Basilio Cairo.  Young Berry, RN, BSN, [facility] Navigator [phone]

## 2012-09-18 ENCOUNTER — Ambulatory Visit
Admission: RE | Admit: 2012-09-18 | Discharge: 2012-09-18 | Disposition: A | Discharge status: Home / Self Care | Payer: Medicare Other | Source: Ambulatory Visit | Attending: Radiation Oncology | Admitting: Radiation Oncology

## 2012-09-18 ENCOUNTER — Encounter: Payer: Self-pay | Admitting: Radiation Oncology

## 2012-09-18 VITALS — BP 137/69 | HR 88 | Temp 98.2°F | Ht 71.0 in | Wt 168.6 lb

## 2012-09-18 DIAGNOSIS — I10 Essential (primary) hypertension: Secondary | ICD-10-CM | POA: Insufficient documentation

## 2012-09-18 DIAGNOSIS — E039 Hypothyroidism, unspecified: Secondary | ICD-10-CM | POA: Insufficient documentation

## 2012-09-18 DIAGNOSIS — M109 Gout, unspecified: Secondary | ICD-10-CM | POA: Insufficient documentation

## 2012-09-18 DIAGNOSIS — Z951 Presence of aortocoronary bypass graft: Secondary | ICD-10-CM | POA: Insufficient documentation

## 2012-09-18 DIAGNOSIS — Z79899 Other long term (current) drug therapy: Secondary | ICD-10-CM | POA: Insufficient documentation

## 2012-09-18 DIAGNOSIS — E785 Hyperlipidemia, unspecified: Secondary | ICD-10-CM | POA: Insufficient documentation

## 2012-09-18 DIAGNOSIS — C4400 Unspecified malignant neoplasm of skin of lip: Secondary | ICD-10-CM

## 2012-09-18 DIAGNOSIS — C4402 Squamous cell carcinoma of skin of lip: Secondary | ICD-10-CM | POA: Insufficient documentation

## 2012-09-18 DIAGNOSIS — K219 Gastro-esophageal reflux disease without esophagitis: Secondary | ICD-10-CM | POA: Insufficient documentation

## 2012-09-18 DIAGNOSIS — C770 Secondary and unspecified malignant neoplasm of lymph nodes of head, face and neck: Secondary | ICD-10-CM | POA: Insufficient documentation

## 2012-09-18 DIAGNOSIS — C801 Malignant (primary) neoplasm, unspecified: Secondary | ICD-10-CM

## 2012-09-18 DIAGNOSIS — I2510 Atherosclerotic heart disease of native coronary artery without angina pectoris: Secondary | ICD-10-CM | POA: Insufficient documentation

## 2012-09-18 DIAGNOSIS — I252 Old myocardial infarction: Secondary | ICD-10-CM | POA: Insufficient documentation

## 2012-09-18 HISTORY — DX: Allergy, unspecified, initial encounter: T78.40XA

## 2012-09-18 NOTE — Progress Notes (Signed)
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  HISTORY OF PRESENT ILLNESS::[name] [name] is a 79 y.o. male who has a history of multiple skin cancers.  He most recently had a lower lip lesion (squamous cell carcinoma)  which initially was found to involve 1 submental node upon limited neck dissection.  He was then followed by Dr. Ezzard Standing with physical exams and later developed a new suspicious node at left level I; on CT 07/26/12, the lymph node was rounded and appears slightly increased in size from the prior study of 3/18. He underwent repeat limited neck dissection on  08/17/12 yielding one more positive node. See reports below.  He has been discussed at [facility] tumor board and the concern is for risk of nodal failure in the high risk echelons of the neck, not the facial node or the lip itself.  He reports 4 lb weight loss recently.  Healing well from surgeries.  PATH SUMMARY: On 03/05/12 he underwent lip excision of a 10 x 7 mm erythematous ulcerated lesion; which had been treated with cryosurgery on 02/13/12. Path revealed: -Skin , (C) central lower lip, destruction of malignant WELL DIFFERENTIATED SQUAMOUS CELL CARCINOMA; There is prominent keratinocyte atypia of the epidermis with infiltrating disease involving the underlying dermis. This represents a well differentiated squamous cell carcinoma.  On 3/21 he underwent lip reexcision and limited neck dissection - path revealed: 1. Skin , Lower lip - BENIGN SKIN WITH DERMAL SCAR, SEE COMMENT. - NO RESIDUAL INVASIVE MALIGNANCY IDENTIFIED. - DEEP AND PERIPHERAL EXCISIONAL MARGINS, NEGATIVE FOR DYSPLASIA OR MALIGNANCY. 2. Lymph node, biopsy,  submandibular node - ONE LYMPH NODE, NEGATIVE FOR TUMOR (0/1). 3. Lymph nodes, regional resection, Submental - ONE LYMPH NODE, POSITIVE FOR METASTATIC SQUAMOUS CELL CARCINOMA (1/7). - NO EXTRACAPSULAR TUMOR EXTENSION IDENTIFIED. Microscopic Comment 1. ONCOLOGY TABLE - LIP AND ORAL CAVITY 1. Specimen, including laterality if applicable: Lower Lip 2. Procedure: Excision 3. Tumor site: N/A 4. Tumor focality: N/A 5. Maximum tumor size (cm): N/A 6. Histologic type: N/A 7. Grade: N/A 8. Margins: Negative 9. Closest margin: Distance from the closest margin ( cm): N/A 10. Microscopic tumor extension: N/A 11. Lymph-vascular invasion: N/A 12. Perineural invasion: N/A 13. Lymph nodes: # examined: 8; # positive: 1 14. TNM code: pTX pN1 pMX 15. Comments: The previous biopsy demonstrating well differentiated squamous cell carcinoma is noted ([id number]). The entire 1.0 cm ill defined white plaque in the current specimen was submitted for review. On review, there is no dysplastic or malignant squamous epithelium identified. The white plaque area corresponds to the dermal scar/previous biopsy site. Other non-neoplastic findings include extensive solar elastosis and actinic injury.  On 08/17/12, surgery was performed and path revealed: Diagnosis 1. Lymph node, biopsy, left neck - ONE LYMPH NODE POSITIVE FOR METASTATIC SQUAMOUS CELL CARCINOMA (1/1). - SEE COMMENT. 2. Lymph node, biopsy, Left neck submandibular - ONE BENIGN LYMPH NODE WITH NO TUMOR SEEN (0/1). 3. Lymph nodes, radical neck dissection, Left modified - BENIGN SALIVARY GLAND TISSUE. - SIX BENIGN LYMPH NODES WITH NO TUMOR SEEN (0/6). Microscopic Comment 1. Sections from the first specimen show a single 1.7 cm lymph node diffusely involved with metastatic squamous cell carcinoma. Definitive extracapsular extension is  not identified.  PREVIOUS RADIATION THERAPY: No  PAST MEDICAL HISTORY:  has a past medical history of Sleep apnea;  Atrial fibrillation; SSS (sick sinus syndrome); Coronary artery disease; Hypertension; Hyperlipidemia; Pulmonary hypertension; History of interstitial lung disease; Diverticulosis; Esophageal dilatation; Hypothyroidism; Gout; Myocardial infarction; Pacemaker; SOB (shortness of breath) on exertion; GERD (gastroesophageal reflux disease); Arthritis; Cancer (08/17/12 bx); and Allergy.    PAST SURGICAL HISTORY: Past Surgical History  Procedure Laterality Date  . Cardiac catheterization  08/08/2006    EF 50%  . Cardiac catheterization  07/26/2003    EF 40%  . Insert / replace / remove pacemaker      IMPLANT  . Coronary artery bypass graft  08/2003    LIMA GRAFT TO THE LAD, SAPHENOUS VEIN GRAFT TO THE CIRCUMFLEX, SAPHENOUS VEIN GRAFT TO THE RIGHT POSTERIOR LATERAL BRANCH  . Mitral valve annuloplasty    . Polypectomy    . Cataract extraction    . Cox-maze microwave ablation    . US echocardiography  07/12/2006    EF 50-55%  . US echocardiography  12/16/2003    EF 55-60%  . Cardiovascular stress test  07/11/2006    EF 50%  . Mass excision N/A 04/20/2012    Procedure: EXCISION OF LOWER LIP CANCER AND EXCISION SUBMENTAL LYMPH NODES;  Surgeon: [name], MD;  Location: [facility] OR;  Service: ENT;  Laterality: N/A;  . Radical neck dissection Left 08/17/2012    Procedure: LEFT NECK DISSECTION;  Surgeon: [name], MD;  Location: [facility] OR;  Service: ENT;  Laterality: Left;    FAMILY HISTORY: family history includes Coronary artery disease in his brother; Hypertension in his mother; Stroke in his father.  SOCIAL HISTORY:  reports that he quit smoking about 32 years ago. His smoking use included Cigarettes. He has a 15 pack-year smoking history. He has never used smokeless tobacco. He reports that he drinks about 1.2 ounces of alcohol per week. He reports that he does not use illicit drugs.  ALLERGIES: Codeine  MEDICATIONS:  Current Outpatient Prescriptions  Medication Sig Dispense Refill    . allopurinol (ZYLOPRIM) 100 MG tablet Take 100 mg by mouth daily.        Marland Kitchen amLODipine (NORVASC) 5 MG tablet daily.      Marland Kitchen aspirin 81 MG tablet Take 81 mg by mouth daily.        Marland Kitchen atenolol (TENORMIN) 100 MG tablet Take 100 mg by mouth daily.      . benazepril (LOTENSIN) 20 MG tablet Take 20 mg by mouth daily.      . Cholecalciferol (VITAMIN D PO) Take 1 tablet by mouth daily.      . Cyanocobalamin (VITAMIN B 12 PO) Take by mouth daily.      . febuxostat (ULORIC) 40 MG tablet Take 40 mg by mouth daily.       . furosemide (LASIX) 20 MG tablet Take 1 tablet by mouth Daily.      Marland Kitchen HYDROcodone-acetaminophen (NORCO/VICODIN) 5-325 MG per tablet Take 1 tablet by mouth every 4 (four) hours as needed for pain.  24 tablet  0  . lansoprazole (PREVACID) 30 MG capsule Take 30 mg by mouth daily.        Marland Kitchen levothyroxine (SYNTHROID, LEVOTHROID) 100 MCG tablet Take 100 mcg by mouth daily.        . potassium chloride SA (K-DUR,KLOR-CON) 20 MEQ tablet Take 20 mEq by mouth every Monday, Wednesday, and Friday.       . tamsulosin (FLOMAX)  0.4 MG CAPS Take 0.4 mg by mouth daily.      Marland Kitchen warfarin (COUMADIN) 5 MG tablet Take 5 mg by mouth daily.        No current facility-administered medications for this encounter.    REVIEW OF SYSTEMS:  Pertinent items are noted in HPI.   PHYSICAL EXAM:  height is 5\' 11"  (1.803 m) and weight is 168 lb 9.6 oz (76.476 kg). His temperature is 98.2 F (36.8 C). His blood pressure is 137/69 and his pulse is 88. His oxygen saturation is 92%.   General: Alert and oriented, in no acute distress HEENT: Head is normocephalic. Pupils are equally round and reactive to light. Extraocular movements are intact. Lip - small surgical deficit from lower lip, just to the left of midline. Oropharynx is erythematous, but no lesions. Neck: Neck is supple, no palpable cervical or supraclavicular lymphadenopathy. Post op edema in left upper neck. Heart: Regular in rate and rhythm  Chest: Clear to  auscultation bilaterally Abdomen: Soft, nontender, nondistended, with no rigidity or guarding. Extremities: No cyanosis or edema. Lymphatics: No concerning lymphadenopathy. Skin: diffuse sun exposure/damage Musculoskeletal: symmetric strength and muscle tone throughout. Neurologic: Cranial nerves II through XII are grossly intact. No obvious focalities. Speech is fluent. Coordination is intact. Psychiatric: Judgment and insight are intact. Affect is appropriate.  ECOG PS 0  LABORATORY DATA:  Lab Results  Component Value Date   WBC 8.8 08/16/2012   HGB 15.2 08/16/2012   HCT 45.7 08/16/2012   MCV 97.6 08/16/2012   PLT 203 08/16/2012   CMP     Component Value Date/Time   NA 138 08/16/2012 1027   K 5.0 08/16/2012 1027   CL 102 08/16/2012 1027   CO2 25 08/16/2012 1027   GLUCOSE 103* 08/16/2012 1027   BUN 41* 08/16/2012 1027   CREATININE 1.45* 08/16/2012 1027   CALCIUM 10.2 08/16/2012 1027   GFRNONAA 44* 08/16/2012 1027   GFRAA 51* 08/16/2012 1027         RADIOGRAPHY: as above    IMPRESSION/PLAN: As per NCCN guidelines, I told the patient that I would recommend adjuvant RT since at least 2 nodes in his neck were positive after surgery for his skin cancer. I recommend 60 Gy/30 fractions to bilateral levels 1 and 2 of the neck to reduce his risk of regional recurrences.  I do not think RT is likely to affect his life expectancy.  Side effects may include taste changes, fatigue, skin irritation, salivary changes.  I would recommend dental evaluation as RT to the mandibular region and tooth roots can raise risks of dental issues in the future.  He would like to think about whether he wants to pursue RT and will get back to [place].  He has my contact information should any more questions arise.  I spent 50 minutes  face to face with the patient and more than 50% of that time was spent in counseling and/or coordination of care.    __________________________________________   Lonie Peak, MD

## 2012-09-18 NOTE — Progress Notes (Signed)
Please see the Nurse Progress Note in the MD Initial Consult Encounter for this patient. 

## 2012-09-19 DIAGNOSIS — C4400 Unspecified malignant neoplasm of skin of lip: Secondary | ICD-10-CM | POA: Insufficient documentation

## 2012-09-21 NOTE — Addendum Note (Signed)
Encounter addended by: Delynn Flavin, RN on: 09/21/2012 12:20 PM<BR>     Documentation filed: Charges VN

## 2012-09-25 ENCOUNTER — Telehealth: Payer: Self-pay | Admitting: *Deleted

## 2012-09-25 NOTE — Telephone Encounter (Signed)
Called pt in f/u to last week's appt with Dr. Basilio Cairo to inquire about his decision regarding radiation.  Pt indicated he wants to proceed; I relayed  information to Dr. Basilio Cairo via [place].

## 2012-09-26 ENCOUNTER — Ambulatory Visit
Admission: RE | Admit: 2012-09-26 | Discharge: 2012-09-26 | Disposition: A | Discharge status: Home / Self Care | Payer: Medicare Other | Source: Ambulatory Visit | Attending: Radiation Oncology | Admitting: Radiation Oncology

## 2012-09-26 ENCOUNTER — Encounter: Payer: Self-pay | Admitting: Radiation Oncology

## 2012-09-26 DIAGNOSIS — K121 Other forms of stomatitis: Secondary | ICD-10-CM | POA: Insufficient documentation

## 2012-09-26 DIAGNOSIS — C4402 Squamous cell carcinoma of skin of lip: Secondary | ICD-10-CM | POA: Insufficient documentation

## 2012-09-26 DIAGNOSIS — C770 Secondary and unspecified malignant neoplasm of lymph nodes of head, face and neck: Secondary | ICD-10-CM | POA: Insufficient documentation

## 2012-09-26 DIAGNOSIS — Z510 Encounter for antineoplastic radiation therapy: Secondary | ICD-10-CM | POA: Insufficient documentation

## 2012-09-26 DIAGNOSIS — C4400 Unspecified malignant neoplasm of skin of lip: Secondary | ICD-10-CM

## 2012-09-26 DIAGNOSIS — J029 Acute pharyngitis, unspecified: Secondary | ICD-10-CM | POA: Insufficient documentation

## 2012-09-26 NOTE — Progress Notes (Signed)
  Radiation Oncology         (336) [phone] ________________________________  Name: [name] [name] MRN: [id number]  Date: 09/26/2012  DOB: Mar 17, 1930  SIMULATION AND TREATMENT PLANNING NOTE  Outpatient  DIAGNOSIS:  Skin (lip) cancer metastatic to neck  Simulation was performed today as patient recently decided he wants to have radiotherapy. But, he is leaving for vacation on 8-29 for several days.  We will plan his RT so it is ready by his return.  I am making a referral to dentistry - patient will probably see Dr. Kristin Bruins upon his return home - and if major dental work is needed, radiotherapy can be delayed.  NARRATIVE:  The patient was brought to the CT Simulation planning suite.  Identity was confirmed.  All relevant records and images related to the planned course of therapy were reviewed.  The patient freely provided informed written consent to proceed with treatment after reviewing the details related to the planned course of therapy. The consent form was witnessed and verified by the simulation staff.    Then, the patient was set-up in a stable reproducible  supine position for radiation therapy.  CT images were obtained.  Surface markings were placed.  The CT images were loaded into the planning software.    TREATMENT PLANNING NOTE: Treatment planning then occurred.  The radiation prescription was entered and confirmed.    A total of 1 medically necessary complex treatment devices were fabricated and supervised by me - aquaplast mask. I have requested : Intensity Modulated Radiotherapy (IMRT) is medically necessary for this case for the following reason:  Parotid sparing/mandibular sparing/ cord sparing.  The patient will receive 60 Gy in 30 fractions to his upper neck nodes bilaterally (levels 1and 2).   -----------------------------------  Lonie Peak, MD

## 2012-09-28 NOTE — Addendum Note (Signed)
Encounter addended by: Laisha Rau Mintz Luisenrique Conran, RN on: 09/28/2012  4:25 PM<BR>     Documentation filed: Charges VN

## 2012-10-02 ENCOUNTER — Telehealth (HOSPITAL_COMMUNITY): Payer: Self-pay | Admitting: Dentistry

## 2012-10-02 ENCOUNTER — Other Ambulatory Visit (HOSPITAL_COMMUNITY): Payer: Medicare Other | Admitting: Dentistry

## 2012-10-02 NOTE — Telephone Encounter (Signed)
10/02/2012  Patient:            [name] [name] Date of Birth:  [birth year] MRN:                [id number]  Broken appointment. No-show. No call. The wife was called at home. Patient was rescheduled for Thursday, 10/04/2012 8:30 AM Dr. Kristin Bruins

## 2012-10-04 ENCOUNTER — Encounter (HOSPITAL_COMMUNITY): Payer: Self-pay | Admitting: Dentistry

## 2012-10-04 ENCOUNTER — Ambulatory Visit (HOSPITAL_COMMUNITY): Payer: Self-pay | Admitting: Dentistry

## 2012-10-04 VITALS — BP 107/59 | HR 65 | Temp 97.7°F

## 2012-10-04 DIAGNOSIS — Z972 Presence of dental prosthetic device (complete) (partial): Secondary | ICD-10-CM

## 2012-10-04 DIAGNOSIS — K0530 Chronic periodontitis, unspecified: Secondary | ICD-10-CM

## 2012-10-04 DIAGNOSIS — C002 Malignant neoplasm of external lip, unspecified: Secondary | ICD-10-CM

## 2012-10-04 DIAGNOSIS — Z0189 Encounter for other specified special examinations: Secondary | ICD-10-CM

## 2012-10-04 DIAGNOSIS — C801 Malignant (primary) neoplasm, unspecified: Secondary | ICD-10-CM

## 2012-10-04 DIAGNOSIS — K08409 Partial loss of teeth, unspecified cause, unspecified class: Secondary | ICD-10-CM

## 2012-10-04 DIAGNOSIS — K036 Deposits [accretions] on teeth: Secondary | ICD-10-CM

## 2012-10-04 DIAGNOSIS — K089 Disorder of teeth and supporting structures, unspecified: Secondary | ICD-10-CM

## 2012-10-04 DIAGNOSIS — K083 Retained dental root: Secondary | ICD-10-CM

## 2012-10-04 MED ORDER — SODIUM FLUORIDE 1.1 % DT GEL: DENTAL | Status: DC

## 2012-10-04 MED ORDER — AMOXICILLIN 500 MG PO CAPS: ORAL_CAPSULE | ORAL | Status: DC

## 2012-10-04 NOTE — Progress Notes (Signed)
DENTAL CONSULTATION  Date of Consultation:  10/04/2012 Patient Name:   [name] [name] Date of Birth:   1930-03-10 Medical Record Number: [id number]  VITALS: BP 107/59  Pulse 65  Temp(Src) 97.7 F (36.5 C) (Oral)  CHIEF COMPLAINT: Patient was referred by Dr. Basilio Cairo for a preradiation therapy dental protocol examination.  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 Patient with anticipated radiation therapy with Dr. Basilio Cairo. Patient is now seen as part of a medically necessary preradiation therapy dental protocol examination.  The patient currently denies acute toothaches, swellings, or abscesses. Patient was last seen approximately 3 months ago for an exam and cleaning with Dr. Glean Hess. The patient is usually seen on an every 4 month basis. Patient denies having any dental treatment needs at this time. Patient has an upper cast partial denture. Patient indicates that the partial denture" fits fine".  PROBLEM LIST: Patient Active Problem List   Diagnosis Date Noted  . Skin cancer of lip 09/19/2012  . 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 Cancer   . Carotid stenosis 12/07/2011  . Pulmonary hypertension 10/06/2010  . AV BLOCK, COMPLETE 02/17/2010  . ATRIAL FIBRILLATION 02/17/2010  . PACEMAKER-[employer] 02/17/2010  . SLEEP APNEA 12/29/2009  . HYPOXEMIA 06/25/2009  . COUGH 01/19/2007  . ACUTE BRONCHITIS 01/12/2007  . INTERSTITIAL LUNG DISEASE 01/03/2007  . DYSPHAGIA PHARYNGEAL PHASE 01/03/2007  . HYPOTHYROIDISM 10/26/2006  . HYPERLIPIDEMIA 10/26/2006  . GOUT 10/26/2006  . HYPERTENSION 10/26/2006  . CORONARY ARTERY DISEASE 10/26/2006  . ALLERGIC RHINITIS 10/26/2006  . STRICTURE, ESOPHAGEAL 10/26/2006     PMH: Past Medical History  Diagnosis Date  . Sleep apnea   . Atrial fibrillation   . SSS (sick sinus  syndrome)   . Coronary artery disease   . Hypertension   . Hyperlipidemia   . Pulmonary hypertension   . History of interstitial lung disease   . Diverticulosis   . Esophageal dilatation   . Hypothyroidism   . Gout   . Myocardial infarction   . Pacemaker   . SOB (shortness of breath) on exertion     sees dr. Delton Coombes occassionally  . GERD (gastroesophageal reflux disease)   . Arthritis     neck  . Cancer 08/17/12 bx    neck,left  . Allergy     codeine    PSH: Past Surgical History  Procedure Laterality Date  . Cardiac catheterization  08/08/2006    EF 50%  . Cardiac catheterization  07/26/2003    EF 40%  . Insert / replace / remove pacemaker      IMPLANT  . Coronary artery bypass graft  08/06/2003    3Vessel LIMA GRAFT TO THE LAD, SAPHENOUS VEIN GRAFT TO THE CIRCUMFLEX, SAPHENOUS VEIN GRAFT TO THE RIGHT POSTERIOR LATERAL BRANCH  . Mitral and tricuspid valve annuloplasty  08/06/2003    Dr. Cornelius Moras  . Polypectomy    . Cataract extraction    . Cox-maze microwave ablation    . US echocardiography  07/12/2006    EF 50-55%  . US echocardiography  12/16/2003    EF 55-60%  . Cardiovascular stress test  07/11/2006    EF 50%  . Mass excision N/A 04/20/2012    Procedure: EXCISION OF LOWER LIP CANCER AND EXCISION SUBMENTAL LYMPH NODES;  Surgeon: [name], MD;  Location: [facility] OR;  Service: ENT;  Laterality:  N/A;  . Radical neck dissection Left 08/17/2012    Procedure: LEFT NECK DISSECTION;  Surgeon: [name], MD;  Location: [facility] OR;  Service: ENT;  Laterality: Left;    ALLERGIES: Allergies  Allergen Reactions  . Codeine Nausea Only    MEDICATIONS: Current Outpatient Prescriptions  Medication Sig Dispense Refill  . allopurinol (ZYLOPRIM) 100 MG tablet Take 100 mg by mouth daily.        Marland Kitchen amLODipine (NORVASC) 5 MG tablet daily.      Marland Kitchen amoxicillin (AMOXIL) 500 MG capsule Take four capsules one hour before dental appointment.  4 capsule  1  . aspirin 81 MG tablet Take  81 mg by mouth daily.        Marland Kitchen atenolol (TENORMIN) 100 MG tablet Take 100 mg by mouth daily.      . benazepril (LOTENSIN) 20 MG tablet Take 20 mg by mouth daily.      . Cholecalciferol (VITAMIN D PO) Take 1 tablet by mouth daily.      . Cyanocobalamin (VITAMIN B 12 PO) Take by mouth daily.      . febuxostat (ULORIC) 40 MG tablet Take 40 mg by mouth daily.       . furosemide (LASIX) 20 MG tablet Take 1 tablet by mouth Daily.      Marland Kitchen HYDROcodone-acetaminophen (NORCO/VICODIN) 5-325 MG per tablet Take 1 tablet by mouth every 4 (four) hours as needed for pain.  24 tablet  0  . lansoprazole (PREVACID) 30 MG capsule Take 30 mg by mouth daily.        Marland Kitchen levothyroxine (SYNTHROID, LEVOTHROID) 100 MCG tablet Take 100 mcg by mouth daily.        . potassium chloride SA (K-DUR,KLOR-CON) 20 MEQ tablet Take 20 mEq by mouth every Monday, Wednesday, and Friday.       . sodium fluoride (FLUORISHIELD) 1.1 % GEL dental gel Instill one drop of gel per tooth space of fluoride trays. Place over teeth for 5 minutes. Remove. Spit out excess. Repeat nightly.  120 mL  prn  . tamsulosin (FLOMAX) 0.4 MG CAPS Take 0.4 mg by mouth daily.      Marland Kitchen warfarin (COUMADIN) 5 MG tablet Take 5 mg by mouth daily. Takes 1/2 tablet on Wednesday's       No current facility-administered medications for this visit.    LABS: Lab Results  Component Value Date   WBC 8.8 08/16/2012   HGB 15.2 08/16/2012   HCT 45.7 08/16/2012   MCV 97.6 08/16/2012   PLT 203 08/16/2012      Component Value Date/Time   NA 138 08/16/2012 1027   K 5.0 08/16/2012 1027   CL 102 08/16/2012 1027   CO2 25 08/16/2012 1027   GLUCOSE 103* 08/16/2012 1027   BUN 41* 08/16/2012 1027   CREATININE 1.45* 08/16/2012 1027   CALCIUM 10.2 08/16/2012 1027   GFRNONAA 44* 08/16/2012 1027   GFRAA 51* 08/16/2012 1027   Lab Results  Component Value Date   INR 1.30 08/19/2012   INR 1.25 08/16/2012   INR 1.3 07/18/2012   No results found for this basename: PTT    SOCIAL  HISTORY: History   Social History  . Marital Status: Married    Spouse Name: N/A    Number of Children: 3  . Years of Education: N/A   Occupational History  .     Social History Main Topics  . Smoking status: Former Smoker -- 1.00 packs/day for 15 years  Types: Cigarettes    Quit date: 09/20/1980  . Smokeless tobacco: Never Used  . Alcohol Use: 1.2 oz/week    2 Glasses of wine per week     Comment: 2 glasses of wine daily  . Drug Use: No  . Sexual Activity: Not on file   Other Topics Concern  . Not on file   Social History Narrative  . No narrative on file    FAMILY HISTORY: Family History  Problem Relation Age of Onset  . Hypertension Mother   . Stroke Father   . Coronary artery disease Brother      REVIEW OF SYSTEMS: Reviewed with the patient and included in dental record .  DENTAL HISTORY: CHIEF COMPLAINT: Patient was referred by Dr. Basilio Cairo for a preradiation therapy dental protocol examination.  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 Patient with anticipated radiation therapy with Dr. Basilio Cairo. Patient is now seen as part of a medically necessary preradiation therapy dental protocol examination.  The patient currently denies acute toothaches, swellings, or abscesses. Patient was last seen approximately 3 months ago for an exam and cleaning with Dr. Glean Hess. The patient is usually seen on an every 4 month basis. Patient denies having any dental treatment needs at this time. Patient has an upper cast partial denture. Patient indicates that the partial denture" fits fine".  DENTAL EXAMINATION:  GENERAL: The patient is a well-developed, well-nourished male in no acute distress. HEAD AND NECK: The left and anterior neck are consistent with previous nodal biopsies. There is no right neck lymphadenopathy. The patient denies acute TMJ symptoms. INTRAORAL EXAM:  The patient has incipient xerostomia. There is no evidence of abscess formation within the mouth. DENTITION: The patient is missing tooth numbers 1, 2, 3, 4, 5, 15, 16, 18, and 32. Tooth numbers 6 and 7 are present as root segments PERIODONTAL: The patient has chronic periodontitis with plaque accumulations, selective areas gingival recession and incipient tooth mobility. There is incipient to moderate bone loss noted. Periodontal charting was deferred secondary to need for antibiotic premedication prior to invasive dental procedures for the previous mitral and tricuspid valve repairs. DENTAL CARIES/SUBOPTIMAL RESTORATIONS: There are no obvious dental caries noted. Patient does have several areas of flexure lesions. Tooth #12 has occlusal erosion/attrition. ENDODONTIC: The patient currently denies acute pulpitis symptoms. Patient has had previous root canal therapy associated with tooth #6. CROWN AND BRIDGE: There multiple crown restorations noted. These appear to be clinically acceptable. IMPLANTS: The patient has single-tooth implants in the area of tooth numbers 19, 20, 21 with crown restorations in place. PROSTHODONTIC: The patient has a maxillary cast partial denture with an attachment within the root of tooth number 6.  This is clinically acceptable with retention and stability. The patient has not been taking the partial denture out at bedtime and was instructed to do so at this time. Patient also was encouraged to use chlorhexidine rinses twice daily to aid in disinfection of the oral cavity. OCCLUSION: The patient has a poor occlusal scheme but stable occlusion. This is secondary to a deep overbite, supra-eruption and drifting of the unopposed teeth into the edentulous areas, replacement of missing teeth with implants and maxillary cast partial denture. RADIOGRAPHIC INTERPRETATION:  An orthopantogram was obtained and supplemented with periapical radiographs and bitewing x-rays. There are  multiple missing teeth. There are multiple retained root segments in the area tooth numbers 6 and 7. There  are implants in the area tooth numbers 19, 20, 21. There is supra-eruption and drifting of the unopposed teeth into the edentulous areas. There is incipient to moderate bone loss. There is an attachment associated with tooth #6. There are no obvious periapical radiolucencies noted. Tooth #6 has had previous root canal therapy. There is pneumatization of the maxillary right sinus.   ASSESSMENTS: 1. Chronic periodontitis with bone loss 2. Plaque and calculus accumulations 3. Gingival recession 4. Incipient tooth mobility 5. xerostomia 6. Multiple missing teeth 7. Retained roots in the area tooth numbers 6 and 7. 8. Multiple flexure lesions 9. Implants in the area of tooth numbers 19, 20, 21 that have been restored with crown restorations. 10. Maxillary cast partial denture that is clinically acceptable 11. Poor occlusal scheme but a stable occlusion 12. Need for antibiotic premedication prior to invasive dental procedures due to history of mitral valve and tricuspid valve repairs. 13. Current warfarin therapy with risk for bleeding with invasive dental procedures 14. Significant medical comorbidities  PLAN/RECOMMENDATIONS: 1. I discussed the risks, benefits, and complications of various treatment options with the patient in relationship to his medical and dental conditions, anticipated radiation therapy and radiation therapy side effects to include xerostomia, radiation caries, trismus, mucositis, taste changes, gum and jawbone changes, and risk for infection and osteoradionecrosis. We discussed various treatment options to include no treatment, multiple extractions with alveoloplasty, pre-prosthetic surgery as indicated, periodontal therapy, dental restorations, root canal therapy, crown and bridge therapy, implant therapy, and replacement of missing teeth as indicated. The patient currently   refuses any dental extraction of teeth at this time. The patient does wish to proceed with impressions today for the fabrication of fluoride trays. The patient also agrees to followup with his primary dentist, Dr. Sharma Covert, for a dental cleaning prior to radiation therapy as time and space permits. The patient also agrees to use antibiotic premedication prior to invasive dental procedures due to his history of mitral valve and tricuspid valve repairs. This was confirmed with Dr. Tressie Stalker (heart surgeon). A prescription for amoxicillin premedication  was sent to the pharmacy along with the prescription for FluoroSHIELD -fluoride therapy at this time.  The patient is to start using the chlorhexidine rinses twice daily at this time. Patient indicates that he does have a previous prescription for this medication.  2. Discussion of findings with medical team and coordination of future medical and dental care as needed.  Charlynne Pander, DDS

## 2012-10-04 NOTE — Patient Instructions (Signed)
RADIATION THERAPY AND DECISIONS REGARDING YOUR TEETH  Xerostomia (dry mouth) Your salivary glands may be in the filed of radiation.  Radiation may include all or part of your saliva glands.  This will cause your saliva to dry up and you will have a dry mouth.  The dry mouth will be for the rest of your life unless your radiation oncologist tells you otherwise.  Your saliva has many functions:  Saliva wets your tongue for speaking.  It coats your teeth and the inside of your mouth for easier movement.  It helps with chewing and swallowing food.  It helps clean away harmful acid and toxic products made by the germs in your mouth, therefore it helps prevent cavities.  It kills some germs in your mouth and helps to prevent gum disease.  It helps to carry flavor to your taste buds.  Once you have lost your saliva you will be at higher risk for tooth decay and gum disease.  What can be done to help improve your mouth when there's not enough saliva:  1.  Your dentist may give a prescription for Salagen.  It will not bring back all of your saliva but may bring back some of it.  Also your saliva may be thick and ropy or white and foamy. It will not feel like it use to feel.  2.  You will need to swish with water every time your mouth feels dry.  YOU CANNOT suck on any cough drops, mints, lemon drops, candy, vitamin C or any other products.  You cannot use anything other than water to make your mouth feel less dry.  If you want to drink anything else you have to drink it all at once and brush afterwards.  Be sure to discuss the details of your diet habits with your dentist or hygienist.  Radiation caries: This is decay that happens very quickly once your mouth is very dry due to radiation therapy.  Normally cavities take six months to two years to become a problem.  When you have dry mouth cavities may take as little as eight weeks to cause you a problem.  This is why dental check ups every two  months are necessary as long as you have a dry mouth. Radiation caries typically, but not always, start at your gum line where it is hard to see the cavity.  It is therefore also hard to fill these cavities adequately.  This high rate of cavities happens because your mouth no longer has saliva and therefore the acid made by the germs starts the decay process.  Whenever you eat anything the germs in your mouth change the food into acid.  The acid then burns a small hole in your tooth.  This small hole is the beginning of a cavity.  If this is not treated then it will grow bigger and become a cavity.  The way to avoid this hole getting bigger is to use fluoride every evening as prescribed by your dentist.  You have to make sure that your teeth are very clean before you use the fluoride.  This fluoride in turn will strengthen your teeth and prepare them for another day of fighting acid.  If you develop radiation caries many times the damage is so large that you will have to have all your teeth removed.  This could be a big problem if some of these teeth are in the field of radiation.  Further details of why this could be   a big problem will follow.  (See Osteoradionecrosis).  Loss of taste (dysgeusia) This happens to varying degrees once you've had radiation therapy to your jaw region.  Many times taste is not completely lost but becomes limited.  The loss of taste is mostly due to radiation affecting your taste buds.  However if you have no saliva in your mouth to carry the flavor to your taste buds it would be difficult for your taste buds to taste anything.  That is why using water or a prescription for Salagen prior to meals and during meals may help with some of the taste.  Keep in mind that taste generally returns very slowly over the course of several months or several years after radiation therapy.  Don't give up hope.  Trismus According to your Radiation Oncologist your TMJ or jaw joints are going to be  partially or fully in the field of radiation.  This means that over time the muscles that help you open and close your mouth may get stiff.  This will potentially result in your not being able to open your mouth wide enough or as wide as you can open it now.  Le me give you an example of how slowly this happens and how unaware people are of it.  A gentlemen that had radiation therapy two years ago came back to me complaining that bananas are just too large for him to be able to fit them in between his teeth.  He was not able to open wide enough to bite into a banana.  This happens slowly and over a period of time.  What do we do to try and prevent this?  Your dentist will probably give you a stack of sticks called a trismus exercise device .  This stack will help your remind your muscles and your jaw joint to open up to the same distance every day.  Use these sticks every morning when you wake up according to the instructions given by the dentist.   You must use these sticks for at least one to two years after radiation therapy.  The reason for that is because it happens so slowly and keeps going on for about two years after radiation therapy.  Your hospital dentist will help you monitor your mouth opening and make sure that it's not getting smaller.  Osteoradionecrosis (ORN) This is a condition where your jaw bone after having had radiation therapy becomes very dry.  It has very little blood supply to keep it alive.  If you develop a cavity that turns into an abscess or an infection then the jaw bone does not have enough blood supply to help fight the infection.  At this point it is very likely that the infection could cause the death of your jaw bone.  When you have dead bone it has to be removed.  Therefore you might end up having to have surgery to remove part of your jaw bone, the part of the jaw bone that has been affected.   Healing is also a problem if you are to have surgery in the areas where the bone  has had radiation therapy.  The same reasons apply.  If you have surgery you need more blood supply which is not available.  When blood supply and oxygen are not available again, there is a chance for the bone to die.  Occasionally ORN happens on its own with no obvious reason.  This is quite rare.  We believe that   patients who continue to smoke and/or drink alcohol have a higher chance of having this bone problem.  Therefore once your jaw bone has had radiation therapy if there are any teeth in that area, you should never have them pulled.  You should also never have any surgery on your teeth or gums in that area unless the oral surgeon or Periodontist is aware of your history of radiation. There is some expensive management techniques that might be used to limit your risks.  The risks for ORN either from infection or spontaneous ( or on it's own) are life long.    TRISMUS  Trismus is a condition where the jaw does not allow the mouth to open as wide as it usually does.  This can happen almost suddenly, or in other cases the process is so slow, it is hard to notice it-until it is too far along.  When the jaw joints and/or muscles have been exposed to radiation treatments, the onset of Trismus is very slow.  This is because the muscles are losing their stretching ability over a long period of time, as long as 2 YEARS after the end of radiation.  It is therefore important to exercise these muscles and joints.  TRISMUS EXERCISES   Stack of tongue depressors measuring the same or a little less than the last documented MIO (Maximum Interincisal Opening).  Secure them with a rubber band on both ends.  Place the stack in the patient's mouth, supporting the other end.  Allow 30 seconds for muscle stretching.  Rest for a few seconds.  Repeat 3-5 times  For all radiation patients, this exercise is recommended in the mornings and evenings unless otherwise instructed.  The exercise should be done for  a period of 2 YEARS after the end of radiation.  MIO should be checked routinely on recall dental visits by the general dentist or the hospital dentist.  The patient is advised to report any changes, soreness, or difficulties encountered when doing the exercises.  FLUORIDE TRAYS PATIENT INSTRUCTIONS    Obtain prescription from the pharmacy.  Don't be surprised if it needs to be ordered.   Be sure to let the pharmacy know when you are close to needing a new refill for them to have it ready for you without interruption of Fluoride use.   The best time to use your Fluoride is before bed time.   You must brush your teeth very well and floss before using the Fluoride in order to get the best use out of the Fluoride treatments.   Place 1 drop of Fluoride gel per tooth in the tray.   Place the tray on your lower teeth and/or your upper teeth.  Make sure the trays are seated all the way.  Remember, they only fit one way on your teeth.   Insert for 5 full minutes.   At the end of the 5 minutes, take the trays out.  SPIT OUT excess. .    Do NOT rinse your mouth!    Do NOT eat or drink after treatments for at least 30 minutes.  This is why the best time for your treatments is before bedtime.    Clean the inside of your Fluoride trays using COLD WATER and a toothbrush.    In order to keep your Trays from discoloring and free from odors, soak them overnight in denture cleaners such as Efferdent.  Do not use bleach or non denture products.    Store the trays   in a safe dry place AWAY from any heat until your next treatment.    Bring the trays with you for your next dental check-up.  The dentist will confirm their fit.    If anything happens to your Fluoride trays, or they don't fit as well after any dental work, please let us know as soon as possible. 

## 2012-10-08 ENCOUNTER — Encounter: Payer: Self-pay | Admitting: *Deleted

## 2012-10-08 ENCOUNTER — Ambulatory Visit
Admission: RE | Admit: 2012-10-08 | Discharge: 2012-10-08 | Disposition: A | Discharge status: Home / Self Care | Payer: Medicare Other | Source: Ambulatory Visit | Attending: Radiation Oncology | Admitting: Radiation Oncology

## 2012-10-08 ENCOUNTER — Ambulatory Visit (HOSPITAL_COMMUNITY): Payer: Self-pay | Admitting: Dentistry

## 2012-10-08 ENCOUNTER — Encounter: Payer: Self-pay | Admitting: Radiation Oncology

## 2012-10-08 VITALS — BP 133/69 | HR 91 | Temp 97.4°F | Wt 169.7 lb

## 2012-10-08 DIAGNOSIS — C770 Secondary and unspecified malignant neoplasm of lymph nodes of head, face and neck: Secondary | ICD-10-CM

## 2012-10-08 DIAGNOSIS — C4400 Unspecified malignant neoplasm of skin of lip: Secondary | ICD-10-CM

## 2012-10-08 LAB — PACEMAKER DEVICE OBSERVATION

## 2012-10-08 MED ORDER — BIAFINE EX EMUL
CUTANEOUS | Status: DC | PRN
  Administered 2012-10-08: 15:00:00 via TOPICAL

## 2012-10-08 NOTE — Progress Notes (Signed)
Patient here for routine weekly assessment of radiation to bilateral neck.Today was 1 of 30 treatments.Pacemaker was checked and approved for treatment.Patient remained in normal sinus rhythm during treatment. Patient education  Provided and given biafine and Radiation Therapy and you booklet.Routine of clinic reviewed.

## 2012-10-08 NOTE — Progress Notes (Signed)
IMRT Device Note  5.2  delivered field widths represent one set of IMRT treatment devices. The code is [phone].  -----------------------------------  Lonie Peak, MD

## 2012-10-08 NOTE — Progress Notes (Signed)
   Weekly Management Note:  Outpatient Current Dose: 2Gy  Projected Dose: 60 Gy   Narrative:  The patient presents for routine under treatment assessment.  CBCT/MVCT images/Port film x-rays were reviewed.  The chart was checked. No new complaints.  Did not need teeth extracted.   Physical Findings:  weight is 169 lb 11.2 oz (76.975 kg). His temperature is 97.4 F (36.3 C). His blood pressure is 133/69 and his pulse is 91. His oxygen saturation is 91%.  NAD   Impression:  The patient is tolerating radiotherapy.  Plan:  Continue radiotherapy as planned.    ________________________________   Lonie Peak, M.D.

## 2012-10-09 ENCOUNTER — Ambulatory Visit
Admission: RE | Admit: 2012-10-09 | Discharge: 2012-10-09 | Disposition: A | Discharge status: Home / Self Care | Payer: Medicare Other | Source: Ambulatory Visit | Attending: Radiation Oncology | Admitting: Radiation Oncology

## 2012-10-10 ENCOUNTER — Ambulatory Visit (HOSPITAL_COMMUNITY): Payer: Self-pay | Admitting: Dentistry

## 2012-10-10 ENCOUNTER — Ambulatory Visit
Admission: RE | Admit: 2012-10-10 | Discharge: 2012-10-10 | Disposition: A | Discharge status: Home / Self Care | Payer: Medicare Other | Source: Ambulatory Visit | Attending: Radiation Oncology | Admitting: Radiation Oncology

## 2012-10-10 ENCOUNTER — Encounter (HOSPITAL_COMMUNITY): Payer: Self-pay | Admitting: Dentistry

## 2012-10-10 VITALS — BP 128/69 | HR 85 | Temp 98.0°F

## 2012-10-10 DIAGNOSIS — Z0189 Encounter for other specified special examinations: Secondary | ICD-10-CM

## 2012-10-10 DIAGNOSIS — K117 Disturbances of salivary secretion: Secondary | ICD-10-CM

## 2012-10-10 DIAGNOSIS — Z463 Encounter for fitting and adjustment of dental prosthetic device: Secondary | ICD-10-CM

## 2012-10-10 DIAGNOSIS — C002 Malignant neoplasm of external lip, unspecified: Secondary | ICD-10-CM

## 2012-10-10 NOTE — Patient Instructions (Signed)
FLUORIDE TRAYS PATIENT INSTRUCTIONS    Obtain prescription from the pharmacy.  Don't be surprised if it needs to be ordered.   Be sure to let the pharmacy know when you are close to needing a new refill for them to have it ready for you without interruption of Fluoride use.   The best time to use your Fluoride is before bed time.   You must brush your teeth very well and floss before using the Fluoride in order to get the best use out of the Fluoride treatments.   Place 1 drop of Fluoride gel per tooth in the tray.   Place the tray on your lower teeth and/or your upper teeth.  Make sure the trays are seated all the way.  Remember, they only fit one way on your teeth.   Insert for 5 full minutes.   At the end of the 5 minutes, take the trays out.  SPIT OUT excess. .    Do NOT rinse your mouth!    Do NOT eat or drink after treatments for at least 30 minutes.  This is why the best time for your treatments is before bedtime.    Clean the inside of your Fluoride trays using COLD WATER and a toothbrush.    In order to keep your Trays from discoloring and free from odors, soak them overnight in denture cleaners such as Efferdent.  Do not use bleach or non denture products.    Store the trays in a safe dry place AWAY from any heat until your next treatment.    Bring the trays with you for your next dental check-up.  The dentist will confirm their fit.    If anything happens to your Fluoride trays, or they don't fit as well after any dental work, please let us know as soon as possible.

## 2012-10-10 NOTE — Progress Notes (Signed)
10/10/2012  Patient:            [name] [name] Date of Birth:  [birth year] MRN:                [id number]  BP 128/69  Pulse 85  Temp(Src) 98 F (36.7 C) (Oral)  [name] now presents for insertion of upper and lower fluoride trays. Patient had a dental cleaning with his dentist yesterday. Patient didn't take his antibiotic premedication for his previous heart valve surgery.   PROCEDURE: Appliances were tried in and adjusted as needed. [place]. Postop instructions were provided in a written and verbal format concerning the use and care of appliances. All questions were answered. Patient to return to clinic for periodic oral examination in approximately 2 weeks during radiation therapy. Patient to call if questions or problems arise before then.  Charlynne Pander, DDS

## 2012-10-11 ENCOUNTER — Ambulatory Visit
Admission: RE | Admit: 2012-10-11 | Discharge: 2012-10-11 | Disposition: A | Discharge status: Home / Self Care | Payer: Medicare Other | Source: Ambulatory Visit | Attending: Radiation Oncology | Admitting: Radiation Oncology

## 2012-10-12 ENCOUNTER — Ambulatory Visit
Admission: RE | Admit: 2012-10-12 | Discharge: 2012-10-12 | Disposition: A | Discharge status: Home / Self Care | Payer: Medicare Other | Source: Ambulatory Visit | Attending: Radiation Oncology | Admitting: Radiation Oncology

## 2012-10-14 ENCOUNTER — Ambulatory Visit: Payer: Medicare Other

## 2012-10-15 ENCOUNTER — Encounter: Payer: Self-pay | Admitting: Radiation Oncology

## 2012-10-15 ENCOUNTER — Ambulatory Visit
Admission: RE | Admit: 2012-10-15 | Discharge: 2012-10-15 | Disposition: A | Discharge status: Home / Self Care | Payer: Medicare Other | Source: Ambulatory Visit | Attending: Radiation Oncology | Admitting: Radiation Oncology

## 2012-10-15 ENCOUNTER — Ambulatory Visit: Payer: Medicare Other

## 2012-10-15 VITALS — BP 140/64 | HR 84 | Temp 97.6°F | Wt 168.7 lb

## 2012-10-15 DIAGNOSIS — C770 Secondary and unspecified malignant neoplasm of lymph nodes of head, face and neck: Secondary | ICD-10-CM

## 2012-10-15 DIAGNOSIS — C4400 Unspecified malignant neoplasm of skin of lip: Secondary | ICD-10-CM

## 2012-10-15 MED ORDER — MAGIC MOUTHWASH W/LIDOCAINE: 10.0000 mL | Freq: Four times a day (QID) | ORAL | Status: DC | PRN

## 2012-10-15 MED ORDER — NYSTATIN 100000 UNIT/ML MT SUSP: 500000.0000 [IU] | Freq: Four times a day (QID) | OROMUCOSAL | Status: DC

## 2012-10-15 NOTE — Progress Notes (Signed)
   Weekly Management Note:  Outpatient Current Dose:  12 Gy  Projected Dose: 60 Gy   Narrative:  The patient presents for routine under treatment assessment.  CBCT/MVCT images/Port film x-rays were reviewed.  The chart was checked. Very Sore throat.  Physical Findings:  weight is 168 lb 11.2 oz (76.522 kg). His temperature is 97.6 F (36.4 C). His blood pressure is 140/64 and his pulse is 84. His oxygen saturation is 90%.  Soft Palate - white plaques with some bleeding - consistent with thrush.  Impression:  The patient is tolerating radiotherapy.  Plan:  Continue radiotherapy as planned. On coumadin - cannot Rx fluconazole. Rx'd MMW to soothe throat as well as nystatin.   Will have physics do quality assurance to make sure this is not mucositis from radiotherapy - this area should not have received a high dose thus far.  Instructions:Take Nystatin AFTER meals and before bedtime. Swish, gargle, swallow.  Take Magic Mouthwash 30 min BEFORE meals only. Swish /gargle and spit (but you can swallow if throat hurts).  If your throat is sore before bedtime, take it before using the nystatin.  ________________________________   Lonie Peak, M.D.

## 2012-10-15 NOTE — Patient Instructions (Addendum)
Take Nystatin AFTER meals and before bedtime. Swish, gargle, swallow.  Take Magic Mouthwash 30 min BEFORE meals only. Swish /gargle and spit (but you can swallow if throat hurts).  If your throat is sore before bedtime, take it before using the nystatin.

## 2012-10-15 NOTE — Progress Notes (Signed)
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

## 2012-10-16 ENCOUNTER — Encounter: Payer: Self-pay | Admitting: Radiation Oncology

## 2012-10-16 ENCOUNTER — Ambulatory Visit: Payer: Medicare Other

## 2012-10-16 ENCOUNTER — Ambulatory Visit
Admission: RE | Admit: 2012-10-16 | Discharge: 2012-10-16 | Disposition: A | Discharge status: Home / Self Care | Payer: Medicare Other | Source: Ambulatory Visit | Attending: Radiation Oncology | Admitting: Radiation Oncology

## 2012-10-16 VITALS — BP 119/54 | HR 74 | Temp 98.5°F | Ht 71.0 in | Wt 168.6 lb

## 2012-10-16 DIAGNOSIS — C770 Secondary and unspecified malignant neoplasm of lymph nodes of head, face and neck: Secondary | ICD-10-CM

## 2012-10-16 NOTE — Progress Notes (Signed)
   Weekly Management Note:  Outpatient Current Dose:  14 Gy  Projected Dose: 60 Gy   Narrative:  The patient presents for routine under treatment assessment.  CBCT/MVCT images/Port film x-rays were reviewed.  The chart was checked.  He noticed some blood in his saliva and his lips are a little puffy.  Has been using MMW with good pain alleviation and is using nystatin swish/swallow.  Physical Findings:  height is 5\' 11"  (1.803 m) and weight is 168 lb 9.6 oz (76.476 kg). His temperature is 98.5 F (36.9 C). His blood pressure is 119/54 and his pulse is 74.  White plaques on soft/hardpalate relatively stable.  Scant blood, stable. If plaques are scraped, layer of mucosa sloughs off, causing more bleeding.  Lower lip is mildly swollen.  Impression:  The patient is tolerating radiotherapy.  Plan:  Continue radiotherapy as planned. I reviewed the patient's plan and discussed him with physics. The amount of dose delivered per day and the location getting treatment is as planned.  Therefore, the amount of RT to the palate is minimal (scatter dose) - certainly not enough to sustain mucositis.  I looked at him with my partner and he is in agreement.  This is favored to be an atypical presentation of thrush.  Carcinoma in situ, presenting with tumoritis from low dose RT, is a less likely explanation.  Dr. Ezzard Standing offered to see patient later this week. I left two voicemails w/ pt telling him to call to arrange an appointment.  Pt feels MMW helps his sx a lot, so he will continue that unless swelling worsens in mouth/lips. Knows to go to ED or call 911 if swelling is severe/ or if airway compromise develops.  Will continue nystatin QID as well.  ________________________________   Lonie Peak, M.D.

## 2012-10-16 NOTE — Progress Notes (Signed)
Mr borgmeyer seen today because when he woke up this am with blood in his mouth and swollen upper lip. He started Magic Mouthwash last night.  His vitals are stable and he states that his pain level is less today, at a level 5 on a scale of 0-10, and states that the burning has lessened.  Continued Cardiac monitoring daily with his treatments.

## 2012-10-17 ENCOUNTER — Encounter: Payer: Self-pay | Admitting: Radiation Oncology

## 2012-10-17 ENCOUNTER — Ambulatory Visit: Payer: Medicare Other

## 2012-10-17 ENCOUNTER — Ambulatory Visit
Admission: RE | Admit: 2012-10-17 | Discharge: 2012-10-17 | Disposition: A | Discharge status: Home / Self Care | Payer: Medicare Other | Source: Ambulatory Visit | Attending: Radiation Oncology | Admitting: Radiation Oncology

## 2012-10-17 VITALS — BP 104/58 | HR 68 | Temp 97.9°F

## 2012-10-17 DIAGNOSIS — C770 Secondary and unspecified malignant neoplasm of lymph nodes of head, face and neck: Secondary | ICD-10-CM

## 2012-10-17 MED ORDER — HYDROCODONE-ACETAMINOPHEN 7.5-325 MG/15ML PO SOLN: ORAL | Status: DC

## 2012-10-17 MED ORDER — SENNA 8.6 MG PO TABS: ORAL_TABLET | ORAL | Status: DC

## 2012-10-17 NOTE — Progress Notes (Signed)
[name] here for assessment.  Note mucositis in the back of his throat and tonsillar area with some bleeding.  His upper lip is not as swollen as on yesterday, but the bottom lip is unchanged.  He reports that it is painful to eat, but had 1/2 can of ensure today.  Denies any dizziness when standing

## 2012-10-17 NOTE — Progress Notes (Signed)
   Weekly Management Note:  outpatient Current Dose:  16 Gy  Projected Dose: 60 Gy   Narrative:  The patient presents for routine under treatment assessment.  CBCT/MVCT images/Port film x-rays were reviewed.  The chart was checked.  Mucosa of soft /hard palate looks slightly better.  Pain is the same.  On further questioning and review of chart, patient acknowledges that he took amoxicillin on 9-10 for his dental cleaning.  A day or two later, his sore throat developed. I spoke w/ Dr Ezzard Standing who saw him yesterday and did not have any additional ideas for what could be causing his symptoms  He also has some pain in his gums / inner lip mucosa.  Physical Findings:  temperature is 97.9 F (36.6 C). His blood pressure is 104/58 and his pulse is 68.  Mucosa of soft /hard palate looks slightly better. Still with whitish plaques and some blood. Swelling of lips slightly better.  Impression:  The patient is tolerating radiotherapy.  Plan:  Continue radiotherapy as planned. I had my head physicist review his plan again today.  Quality assurance given.  Dose to soft/hard palate is minimal.  I do not think this is from RT - favor thrush esp in setting of recent Amoxicillin use.  Orajel can be added for numbing of mucosa.  Hycet Rx'd for pain.  Senna to prevent constipation.  Rec'd hydration as much as possible.  Currently drinking/ urinating less.  Will hold Lasix until drinking is back to baseline.  Talked about shakes and soft foods to eat while still having throat pain.  He knows to call if his condition /sx worsen.  ________________________________   Lonie Peak, M.D.

## 2012-10-18 ENCOUNTER — Ambulatory Visit
Admission: RE | Admit: 2012-10-18 | Discharge: 2012-10-18 | Disposition: A | Discharge status: Home / Self Care | Payer: Medicare Other | Source: Ambulatory Visit | Attending: Radiation Oncology | Admitting: Radiation Oncology

## 2012-10-18 ENCOUNTER — Ambulatory Visit: Payer: Medicare Other

## 2012-10-18 DIAGNOSIS — C770 Secondary and unspecified malignant neoplasm of lymph nodes of head, face and neck: Secondary | ICD-10-CM

## 2012-10-18 NOTE — Progress Notes (Signed)
Clinic note: The patient is seen today at his request for persistent mouth discomfort. He is felt to have candidiasis for which he is on nystatin suspension 4 times a day and also Magic mouthwash.  Physical examination: On inspection of the oral cavity there is a hemorrhagic mucositis which is more defined along his soft palate. There is no evidence for candidiasis along his buccal mucosa although there appears to be mucosal swelling along the dorsum of the mid to posterior oral tongue.  Impression: I told him to continue with his nystatin swish and swallow's. He may try to keep nystatin in his mouth for at least 3-4 minutes before gargling in swallowing. He continued this 4-5 times a day.  Plan: As discussed above. Continue with radiation therapy as planned.

## 2012-10-19 ENCOUNTER — Ambulatory Visit
Admission: RE | Admit: 2012-10-19 | Discharge: 2012-10-19 | Disposition: A | Discharge status: Home / Self Care | Payer: Medicare Other | Source: Ambulatory Visit | Attending: Radiation Oncology | Admitting: Radiation Oncology

## 2012-10-19 ENCOUNTER — Ambulatory Visit: Payer: Medicare Other

## 2012-10-22 ENCOUNTER — Ambulatory Visit
Admission: RE | Admit: 2012-10-22 | Discharge: 2012-10-22 | Disposition: A | Discharge status: Home / Self Care | Payer: Medicare Other | Source: Ambulatory Visit | Attending: Radiation Oncology | Admitting: Radiation Oncology

## 2012-10-22 ENCOUNTER — Encounter: Payer: Self-pay | Admitting: Radiation Oncology

## 2012-10-22 ENCOUNTER — Other Ambulatory Visit: Payer: Self-pay | Admitting: Physician Assistant

## 2012-10-22 ENCOUNTER — Telehealth: Payer: Self-pay | Admitting: *Deleted

## 2012-10-22 VITALS — BP 119/51 | HR 61 | Temp 97.7°F | Ht 71.0 in | Wt 164.7 lb

## 2012-10-22 DIAGNOSIS — C4400 Unspecified malignant neoplasm of skin of lip: Secondary | ICD-10-CM

## 2012-10-22 NOTE — Progress Notes (Signed)
   Weekly Management Note:  outpatient Current Dose:  22 Gy  Projected Dose: 60 Gy   Narrative:  The patient presents for routine under treatment assessment.  CBCT/MVCT images/Port film x-rays were reviewed.  The chart was checked. Throat still very sore. Avoiding solids, taking shakes. Still holding Lasix.    Physical Findings:  height is 5\' 11"  (1.803 m) and weight is 164 lb 11.2 oz (74.707 kg). His temperature is 97.7 F (36.5 C). His blood pressure is 119/51 and his pulse is 61.   NAD, soft palate - ulceration of mucosa healing,  Less bleeding, white patches largely resolved.  Inner mucosa of lip - erythematous.   Impression:  The patient is tolerating radiotherapy.  Plan:  Continue radiotherapy as planned. Continue current meds.  Continue holding lasix.  Consult nutrition for weight loss.  ________________________________   Lonie Peak, M.D.

## 2012-10-22 NOTE — Progress Notes (Signed)
[name] [name] has lessening of the ulceration and crusting in the back of his throat.  He still has pain upon swallowing and grades his pain as a level 8 on a scale of 0-10.  Skin in treatment with red, but intact.  continuing to Comptroller daily.  He is due for a check via the [employer] rep on Friday of this week.

## 2012-10-22 NOTE — Telephone Encounter (Signed)
CALLED PATIENT TO INFORM OF NUTRITION APPT. ON 10-24-12 AT 9:00 AM WITH BARBARA NEFF, SPOKE WITH PATIENT'S WIFE, CHRIS AND SHE IS AWARE OF THIS APPT.

## 2012-10-23 ENCOUNTER — Ambulatory Visit (HOSPITAL_COMMUNITY): Payer: Self-pay | Admitting: Dentistry

## 2012-10-23 ENCOUNTER — Ambulatory Visit: Admission: RE | Admit: 2012-10-23 | Payer: Medicare Other | Source: Ambulatory Visit

## 2012-10-23 ENCOUNTER — Encounter: Payer: Self-pay | Admitting: *Deleted

## 2012-10-23 NOTE — Progress Notes (Signed)
8:20 am Pt and his son came into nursing prior to his radiation treatment today requesting to speak w/Dr Basilio Cairo. Pt states "I don't want any more radiation." Informed pt and son that Dr Basilio Cairo is off today, will be in conference tomorrow morning, and her nurse will arrive today at 9 am. Pt requests Dr Basilio Cairo call him tomorrow. He states "I can't deal with this mouth pain." Offered for pt to talk w/Dr Roselind Messier today, but he refused. Pt also requested this nurse call Dr Luretha Murphy office and cancel his appointment today. Pt states he will not take radiation treatment today or tomorrow until he speaks w/Dr Basilio Cairo. Informed the pt this RN will route his request to Dr Reeves Dam, RN for Dr Basilio Cairo and will verbally inform Dr Basilio Cairo tomorrow, inform Elnita Maxwell RN, and Dr Luretha Murphy office today. Asked if anything else could be done for pt today, and he stated "No". Reaffirmed  w/pt and son that all his concerns and requests will be routed to the proper persons today. Also informed pt that this RN will request Elnita Maxwell RN call him today. Pt and verbalized understanding, agreement.  Called Dr Luretha Murphy office and spoke w/Lisa, Sales executive and informed her of above. Called [facility] and spoke w/Shannon, RT to inform her pt cancelled his radiation treatment today and is waiting to speak w/Dr Basilio Cairo before taking any further treatments.

## 2012-10-24 ENCOUNTER — Ambulatory Visit: Payer: Medicare Other

## 2012-10-24 ENCOUNTER — Encounter: Payer: Self-pay | Admitting: Nutrition

## 2012-10-24 NOTE — Progress Notes (Signed)
Patient did not show up for scheduled nutrition appointment. 

## 2012-10-25 ENCOUNTER — Encounter: Payer: Self-pay | Admitting: *Deleted

## 2012-10-25 ENCOUNTER — Other Ambulatory Visit: Payer: Self-pay | Admitting: Radiation Oncology

## 2012-10-25 ENCOUNTER — Ambulatory Visit
Admission: RE | Admit: 2012-10-25 | Discharge: 2012-10-25 | Disposition: A | Discharge status: Home / Self Care | Payer: Medicare Other | Source: Ambulatory Visit | Attending: Radiation Oncology | Admitting: Radiation Oncology

## 2012-10-25 DIAGNOSIS — B370 Candidal stomatitis: Secondary | ICD-10-CM

## 2012-10-26 ENCOUNTER — Ambulatory Visit: Payer: Medicare Other

## 2012-10-26 ENCOUNTER — Other Ambulatory Visit: Payer: Self-pay | Admitting: Radiation Oncology

## 2012-10-26 ENCOUNTER — Encounter: Payer: Self-pay | Admitting: Radiation Oncology

## 2012-10-26 ENCOUNTER — Telehealth: Payer: Self-pay | Admitting: *Deleted

## 2012-10-26 DIAGNOSIS — C770 Secondary and unspecified malignant neoplasm of lymph nodes of head, face and neck: Secondary | ICD-10-CM

## 2012-10-26 MED ORDER — MAGIC MOUTHWASH W/LIDOCAINE: 10.0000 mL | Freq: Four times a day (QID) | ORAL | Status: DC | PRN

## 2012-10-26 NOTE — Progress Notes (Signed)
I spoke with Mr. Sheppard this morning. He has refused radiotherapy for the past few days due to continued mouth soreness. He saw Dr. Ezzard Standing earlier this week and started an antibiotic. He says his mouth is feeling better but it has not fully recovered. He is continuing nystatin and Magic mouthwash with lidocaine. The patient and I spoke about the options of continuing radiation versus continuing his break. He understands that the longer his break from radiation the more time that tumor cells would have to repopulate and this could diminish the efficacy of the radiotherapy. He understands the benefit of radiation would be potentially for local regional control but it is unlikely to improve his life expectancy. He understands there is a higher risk of having cancer recurrence in his neck nodes and requiring surgery in the future if he does not resume radiation soon. The patient is quite adamant that he does not want to resume radiation (if at all)  until his pain has resolved. He is not sure if he will ever want to resume radiation, even with full resolution of his pain, as I told him that the pain could return if he starts the radiation again. He will be scheduled for a two-week followup. He understands that if he wants to resume radiation sooner, to call my clinic and we will work him in for a followup evaluation more quickly.  He is comfortable with this plan.  -----------------------------------  Lonie Peak, MD

## 2012-10-26 NOTE — Progress Notes (Signed)
Called pt at home to inquire about missed appts and to offer support.  Pt indicated he is having extreme mouth pain r/t sores and stated "I can't go on with radiation treatments if its going to be like this".  He indicated he is using MMW "but it doesn't last long".  I reviewed administration with him and encouraged him to use before meals and bedtime.  Encouraged him to use Hycet as ordered.  Pt indicated his appreciation for my contact and support but stated he will not be coming for tomorrow's treatment.  Conversation shared with Dr. Basilio Cairo via [place].  Young Berry, RN, BSN, [facility] Navigator [phone]

## 2012-10-26 NOTE — Telephone Encounter (Signed)
CALLED PATIENT TO INFORM OF 2 WK. FU VISIT PER DR. SQUIRE, SPOKE WITH PATIENT AND HE IS AWARE OF THIS VISIT ON 11-09-12 AT 11:20 AM.

## 2012-10-29 ENCOUNTER — Ambulatory Visit: Payer: Medicare Other

## 2012-10-30 ENCOUNTER — Ambulatory Visit: Payer: Medicare Other

## 2012-10-30 ENCOUNTER — Encounter: Payer: Self-pay | Admitting: Radiation Oncology

## 2012-10-30 NOTE — Progress Notes (Signed)
Received voicemail from patient.  He confirmed that he does not want any more radiation treatments. I will keep his f/u scheduled on 11-09-12 as planned.  -----------------------------------  Lonie Peak, MD

## 2012-10-31 ENCOUNTER — Ambulatory Visit: Payer: Medicare Other

## 2012-11-01 ENCOUNTER — Ambulatory Visit: Payer: Medicare Other

## 2012-11-02 ENCOUNTER — Ambulatory Visit: Payer: Medicare Other

## 2012-11-05 ENCOUNTER — Ambulatory Visit: Payer: Medicare Other

## 2012-11-05 NOTE — Progress Notes (Signed)
  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 He developed bleeding and white patches over his palate after only one week of therapy. This was clinically most consistent with severe thrush related to recent antibiotic use. The dose of radiotherapy was not sufficient to cause mucositis - and these tissues were outside of his fields.  Magic mouthwash and nystatin were started. He developed more irritation (erythema) in the inner lower lip and pain in his mouth, with continued throat pain a week later. Mucosa in palate appeared better.  Ultimately, after discussion with Dr. Basilio Cairo, patient refused further treatment.   Plan:   He understands the benefit of radiation would be potentially for local regional control but it is unlikely to improve his life expectancy. He understands there is a higher risk of having cancer recurrence in his neck nodes and requiring surgery in the future if he does not resume radiation soon. The patient is quite adamant that he does not want to resume radiation (if at all) until his pain has resolved. He is not sure if he will ever want to resume radiation, even with full resolution of his pain, as I told him that the pain could return if he starts the radiation again. He will be scheduled for a two-week followup. He understands that if he wants to resume radiation sooner, to call my clinic and we will work him in for a followup evaluation more quickly.  He is comfortable with  this plan.   I advised them to call or return sooner if they have any questions or concerns related to their recovery or treatment. I gave him a 2 week followup.  Subsequently, as of 9-30, patient called to confirm he doesn't want any radiotherapy whatsoever.    -----------------------------------  Lonie Peak, MD

## 2012-11-06 ENCOUNTER — Ambulatory Visit: Payer: Medicare Other

## 2012-11-07 ENCOUNTER — Ambulatory Visit: Payer: Medicare Other

## 2012-11-08 ENCOUNTER — Ambulatory Visit: Payer: Medicare Other

## 2012-11-09 ENCOUNTER — Encounter: Payer: Self-pay | Admitting: Radiation Oncology

## 2012-11-09 ENCOUNTER — Telehealth: Payer: Self-pay | Admitting: *Deleted

## 2012-11-09 ENCOUNTER — Ambulatory Visit
Admission: RE | Admit: 2012-11-09 | Discharge: 2012-11-09 | Disposition: A | Discharge status: Home / Self Care | Payer: Medicare Other | Source: Ambulatory Visit | Attending: Radiation Oncology | Admitting: Radiation Oncology

## 2012-11-09 ENCOUNTER — Ambulatory Visit: Payer: Medicare Other | Admitting: Radiation Oncology

## 2012-11-09 ENCOUNTER — Ambulatory Visit: Payer: Medicare Other

## 2012-11-09 VITALS — BP 131/68 | HR 88 | Temp 98.5°F | Resp 20 | Wt 165.0 lb

## 2012-11-09 DIAGNOSIS — C770 Secondary and unspecified malignant neoplasm of lymph nodes of head, face and neck: Secondary | ICD-10-CM

## 2012-11-09 NOTE — Progress Notes (Addendum)
F/u appt had rad tx to hiss neck from 10/08/12-10/22/12, didn't completes txs, still has thick saliva , doesn't take nystatin or MMW, eats most anythin but no taste, only cantelope he has a taste for, , slight pain in jaw bone, "touchy:, Energy level good stated, never used the biafine cream 3:49 PM

## 2012-11-09 NOTE — Progress Notes (Signed)
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 His sense of taste has still not improved significantly but the pain in his throat and mouth has improved. He no longer is taking a nystatin or Magic mouthwash. He still has thick saliva.                        ALLERGIES:  is allergic to amoxicillin and codeine.  Meds: Current Outpatient Prescriptions  Medication Sig Dispense Refill  . allopurinol (ZYLOPRIM) 100 MG tablet Take 100 mg by mouth daily.        Marland Kitchen amLODipine (NORVASC) 5 MG tablet daily.      Marland Kitchen aspirin 81 MG tablet Take 81 mg by mouth daily.        Marland Kitchen atenolol (TENORMIN) 100 MG tablet Take 100 mg by mouth daily.      . benazepril (LOTENSIN) 20 MG tablet Take 20 mg by mouth daily.      . Cholecalciferol (VITAMIN D PO) Take 1 tablet by mouth daily.      . febuxostat (ULORIC) 40 MG tablet Take 40 mg by mouth daily.       . furosemide (LASIX) 20 MG tablet Take 1 tablet by mouth Daily.      . lansoprazole (PREVACID) 30 MG capsule Take 30 mg by mouth daily.        Marland Kitchen levothyroxine (SYNTHROID, LEVOTHROID) 100 MCG tablet Take 100 mcg by mouth daily.        . potassium chloride SA (K-DUR,KLOR-CON) 20 MEQ tablet Take 20 mEq by mouth every Monday, Wednesday, and Friday.       . senna (SENOKOT) 8.6 MG TABS tablet To prevent constipation while on pain medication.  30 each  1  . tamsulosin (FLOMAX) 0.4 MG CAPS Take 0.4 mg by mouth daily.      Marland Kitchen warfarin (COUMADIN) 5 MG  tablet Take 5 mg by mouth daily. Takes 1/2 tablet on Wednesday's      . Alum & Mag Hydroxide-Simeth (MAGIC MOUTHWASH W/LIDOCAINE) SOLN Take 10 mLs by mouth 4 (four) times daily as needed. 1part Maaloxplus,1part benadryl, 2part 2%viscous lidocaine. Swish/swallow 10mL upto QID  240 mL  0  . Cyanocobalamin (VITAMIN B 12 PO) Take by mouth daily.      Marland Kitchen emollient (BIAFINE) cream Apply 90 g topically as needed.      Marland Kitchen HYDROcodone-acetaminophen (HYCET) 7.5-325 mg/15 ml solution Take 10-54mL with food up to every 4 hours as needed for pain.  473 mL  0  . nystatin (MYCOSTATIN) 100000 UNIT/ML suspension SWISH & GARGLE 5 MLS BY MOUTH FOR 30 SECONDS THEN SWALLOW 4 TIMES DAILY  240 mL  0  . sodium fluoride (FLUORISHIELD) 1.1 % GEL dental gel Instill one drop of gel per tooth space of fluoride trays. Place over teeth for 5 minutes. Remove. Spit out excess. Repeat nightly.  120 mL  prn   No current facility-administered medications for this encounter.  Physical Findings: The patient is in no acute distress. Patient is alert and oriented.  weight is 165 lb (74.844 kg). His oral temperature is 98.5 F (36.9 C). His blood pressure is 131/68 and his pulse is 88. His respiration is 20 and oxygen saturation is 92%. .   Mucositis/bleeding/thrush resolved in oropharynx. No palpable lymphadenopathy in his neck. No radiation effects over the skin, but skin is consistent with baseline dryness and sun damage.   Lab Findings: Lab Results  Component Value Date   WBC 8.8 08/16/2012   HGB 15.2 08/16/2012   HCT 45.7 08/16/2012   MCV 97.6 08/16/2012   PLT 203 08/16/2012    Radiographic Findings: No results found.  Impression/Plan:  He has healed from what clinically was most consistent with a bad case of thrush related to prior antibiotic use. He also had some mouth soreness /taste changes which can be attributed to the radiotherapy. Healing well from radiotherapy. I noted it may take months for his sense of taste to  improve significantly. He will continue to follow closely with otolaryngology. I will see him back on an as-needed basis.  I spent 10 minutes face to face with the patient and more than 50% of that time was spent in counseling and/or coordination of care. _____________________________________   Lonie Peak, MD

## 2012-11-09 NOTE — Telephone Encounter (Signed)
Spoke with patient.  He is willing to see Dr. Basilio Cairo this afternoon for a follow up appointment.  He does not want to resume treatment.  He is now able to eat.

## 2012-11-12 ENCOUNTER — Ambulatory Visit: Payer: Medicare Other

## 2012-11-13 ENCOUNTER — Ambulatory Visit: Payer: Medicare Other

## 2012-11-14 ENCOUNTER — Ambulatory Visit: Payer: Medicare Other

## 2012-11-15 ENCOUNTER — Ambulatory Visit: Payer: Medicare Other

## 2012-11-16 ENCOUNTER — Ambulatory Visit: Payer: Medicare Other

## 2012-11-19 ENCOUNTER — Ambulatory Visit: Payer: Medicare Other

## 2012-11-20 ENCOUNTER — Ambulatory Visit: Payer: Medicare Other

## 2012-11-21 ENCOUNTER — Ambulatory Visit: Payer: Medicare Other

## 2012-11-28 ENCOUNTER — Ambulatory Visit: Payer: Medicare Other | Admitting: Cardiology

## 2012-11-28 ENCOUNTER — Encounter (INDEPENDENT_AMBULATORY_CARE_PROVIDER_SITE_OTHER): Payer: Self-pay

## 2012-11-28 ENCOUNTER — Encounter: Payer: Self-pay | Admitting: Cardiology

## 2012-11-28 ENCOUNTER — Ambulatory Visit (INDEPENDENT_AMBULATORY_CARE_PROVIDER_SITE_OTHER): Payer: Medicare Other | Admitting: Cardiology

## 2012-11-28 ENCOUNTER — Ambulatory Visit (INDEPENDENT_AMBULATORY_CARE_PROVIDER_SITE_OTHER): Payer: Medicare Other | Admitting: *Deleted

## 2012-11-28 VITALS — BP 120/62 | HR 80 | Ht 71.0 in | Wt 158.8 lb

## 2012-11-28 DIAGNOSIS — I4891 Unspecified atrial fibrillation: Secondary | ICD-10-CM

## 2012-11-28 DIAGNOSIS — I2510 Atherosclerotic heart disease of native coronary artery without angina pectoris: Secondary | ICD-10-CM

## 2012-11-28 DIAGNOSIS — I10 Essential (primary) hypertension: Secondary | ICD-10-CM

## 2012-11-28 DIAGNOSIS — I442 Atrioventricular block, complete: Secondary | ICD-10-CM

## 2012-11-28 LAB — PACEMAKER DEVICE OBSERVATION
BRDY-0002RV: 60 {beats}/min
BRDY-0004RV: 120 {beats}/min
RV LEAD IMPEDENCE PM: 763 Ohm

## 2012-11-28 NOTE — Progress Notes (Signed)
Pacemaker check in clinic. Normal device function. Battery longevity 11 months. Pt enrolled in [employer]. [employer] 03-01-13 and ROV in April with [facility].

## 2012-11-28 NOTE — Patient Instructions (Signed)
Continue your current therapy  I will see you in 6 months.   

## 2012-11-28 NOTE — Progress Notes (Signed)
[name] [name] Date of Birth: 05-11-1930   History of Present Illness: [name] is seen today for followup. 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  This resulted in severe sores in his mouth to the point where he could not eat. He had to stop radiation therapy. He lost 15 pounds. He denies any cardiac complaints. He specifically denies any chest pain, palpitations, or dizziness. His chronic shortness of breath is unchanged. He had a pacemaker check in early September which was satisfactory.  Current Outpatient Prescriptions on File Prior to Visit  Medication Sig Dispense Refill  . allopurinol (ZYLOPRIM) 100 MG tablet Take 100 mg by mouth daily.        Marland Kitchen amLODipine (NORVASC) 5 MG tablet daily.      Marland Kitchen aspirin 81 MG tablet Take 81 mg by mouth daily.        Marland Kitchen atenolol (TENORMIN) 100 MG tablet Take 100 mg by mouth daily.      . benazepril (LOTENSIN) 20 MG tablet Take 20 mg by mouth daily.      . Cholecalciferol (VITAMIN D PO) Take 1 tablet by mouth daily.      . Cyanocobalamin (VITAMIN B 12 PO) Take by mouth daily.      . febuxostat (ULORIC) 40 MG tablet Take 40 mg by mouth daily.       . furosemide (LASIX) 20 MG tablet Take 1 tablet by mouth Daily.      . lansoprazole (PREVACID) 30 MG capsule Take 30 mg by mouth daily.        Marland Kitchen levothyroxine (SYNTHROID, LEVOTHROID) 100 MCG tablet Take 100 mcg by mouth daily.        . potassium chloride SA (K-DUR,KLOR-CON) 20 MEQ tablet Take 20 mEq by mouth every Monday, Wednesday, and Friday.       . tamsulosin (FLOMAX) 0.4 MG CAPS Take 0.4 mg by mouth daily.      Marland Kitchen warfarin (COUMADIN) 5 MG tablet Take 5 mg by mouth daily. Takes 1/2 tablet on Wednesday's       No current facility-administered medications on file prior to visit.    Allergies  Allergen Reactions  . Amoxicillin     Severe thrush with crusting and ulceration of back of throat.  . Codeine Nausea Only    Past Medical  History  Diagnosis Date  . Sleep apnea   . Atrial fibrillation   . SSS (sick sinus syndrome)   . Coronary artery disease   . Hypertension   . Hyperlipidemia   . Pulmonary hypertension   . History of interstitial lung disease   . Diverticulosis   . Esophageal dilatation   . Hypothyroidism   . Gout   . Myocardial infarction   . Pacemaker   . SOB (shortness of breath) on exertion     sees dr. Delton Coombes occassionally  . GERD (gastroesophageal reflux disease)   . Arthritis     neck  . Cancer 08/17/12 bx    neck,left  . Allergy     codeine    Past Surgical History  Procedure Laterality Date  . Cardiac catheterization  08/08/2006    EF 50%  . Cardiac catheterization  07/26/2003    EF 40%  . Insert / replace / remove pacemaker      IMPLANT  . Coronary artery bypass graft  08/06/2003    3Vessel LIMA GRAFT TO THE LAD, SAPHENOUS VEIN GRAFT TO THE  CIRCUMFLEX, SAPHENOUS VEIN GRAFT TO THE RIGHT POSTERIOR LATERAL BRANCH  . Mitral and tricuspid valve annuloplasty  08/06/2003    Dr. Cornelius Moras  . Polypectomy    . Cataract extraction    . Cox-maze microwave ablation    . US echocardiography  07/12/2006    EF 50-55%  . US echocardiography  12/16/2003    EF 55-60%  . Cardiovascular stress test  07/11/2006    EF 50%  . Mass excision N/A 04/20/2012    Procedure: EXCISION OF LOWER LIP CANCER AND EXCISION SUBMENTAL LYMPH NODES;  Surgeon: [name], MD;  Location: [facility];  Service: ENT;  Laterality: N/A;  . Radical neck dissection Left 08/17/2012    Procedure: LEFT NECK DISSECTION;  Surgeon: [name], MD;  Location: [facility] OR;  Service: ENT;  Laterality: Left;    History  Smoking status  . Former Smoker -- 1.00 packs/day for 15 years  . Types: Cigarettes  . Quit date: 09/20/1980  Smokeless tobacco  . Never Used    History  Alcohol Use  . 1.2 oz/week  . 2 Glasses of wine per week    Comment: 2 glasses of wine daily    Family History  Problem Relation Age of Onset  .  Hypertension Mother   . Stroke Father   . Coronary artery disease Brother     Review of Systems: As noted in history of present illness.  All other systems were reviewed and are negative.  Physical Exam: BP 120/62  Pulse 80  Ht 5\' 11"  (1.803 m)  Wt 158 lb 12.8 oz (72.031 kg)  BMI 22.16 kg/m2 The patient is alert and oriented x 3.  The HEENT exam reveals a healed surgical scar in the submandibular area  The carotids are 2+ without bruits.  There is no thyromegaly.  There is no JVD.  The lungs are clear.    Pacemaker site is normal. The heart exam reveals a regular rate with a normal S1 and S2.  There is a soft systolic murmur at apex.  The PMI is not displaced.   Abdominal exam reveals good bowel sounds.  There is no guarding or rebound.  There is no hepatosplenomegaly or tenderness.  There are no masses.  Exam of the legs reveal no clubbing, cyanosis, or edema.  The legs are without rashes.  The distal pulses are intact.  Cranial nerves II - XII are intact.  Motor and sensory functions are intact.  The gait is normal.  LABORATORY DATA: ECG demonstrates atrial sensing and ventricular pacing at a rate of 84 beats per minute.  Assessment / Plan: 1. Coronary disease status post CABG in 2005. Cardiac catheterization 2008 showed patent grafts. He is currently asymptomatic. We will continue therapy with aspirin, atenolol, and amlodipine. Followup again in 6 months.  2. History of complete heart block status post pacemaker implant. Satisfactory followup in September.  3. Atrial fibrillation. Rate is well controlled. He is on chronic anticoagulation with Coumadin.  4. Interstitial lung disease and pulmonary hypertension. Symptoms are stable. He does not on oxygen therapy.  5. 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 Intolerant to radiation therapy.

## 2012-12-06 ENCOUNTER — Encounter: Payer: Self-pay | Admitting: Internal Medicine

## 2012-12-10 ENCOUNTER — Encounter: Payer: Self-pay | Admitting: Family

## 2012-12-11 ENCOUNTER — Ambulatory Visit: Payer: Medicare Other | Admitting: Neurosurgery

## 2012-12-11 ENCOUNTER — Inpatient Hospital Stay (HOSPITAL_COMMUNITY): Admission: RE | Admit: 2012-12-11 | Payer: Self-pay | Source: Ambulatory Visit

## 2012-12-11 ENCOUNTER — Ambulatory Visit: Payer: Self-pay | Admitting: Family

## 2012-12-11 ENCOUNTER — Other Ambulatory Visit: Payer: Medicare Other

## 2012-12-20 ENCOUNTER — Encounter: Payer: Self-pay | Admitting: Family

## 2012-12-21 ENCOUNTER — Ambulatory Visit (HOSPITAL_COMMUNITY)
Admission: RE | Admit: 2012-12-21 | Discharge: 2012-12-21 | Disposition: A | Discharge status: Home / Self Care | Payer: Medicare Other | Source: Ambulatory Visit | Attending: Family | Admitting: Family

## 2012-12-21 ENCOUNTER — Encounter: Payer: Self-pay | Admitting: Family

## 2012-12-21 ENCOUNTER — Ambulatory Visit (INDEPENDENT_AMBULATORY_CARE_PROVIDER_SITE_OTHER): Payer: Medicare Other | Admitting: Family

## 2012-12-21 DIAGNOSIS — I6529 Occlusion and stenosis of unspecified carotid artery: Secondary | ICD-10-CM

## 2012-12-21 DIAGNOSIS — Z48812 Encounter for surgical aftercare following surgery on the circulatory system: Secondary | ICD-10-CM | POA: Insufficient documentation

## 2012-12-21 NOTE — Patient Instructions (Signed)
Stroke Prevention Some medical conditions and behaviors are associated with an increased chance of having a stroke. You may prevent a stroke by making healthy choices and managing medical conditions. Reduce your risk of having a stroke by:  Staying physically active. Get at least 30 minutes of activity on most or all days.  Not smoking. It may also be helpful to avoid exposure to secondhand smoke.  Limiting alcohol use. Moderate alcohol use is considered to be:  No more than 2 drinks per day for men.  No more than 1 drink per day for nonpregnant women.  Eating healthy foods.  Include 5 or more servings of fruits and vegetables a day.  Certain diets may be prescribed to address high blood pressure, high cholesterol, diabetes, or obesity.  Managing your cholesterol levels.  A low-saturated fat, low-trans fat, low-cholesterol, and high-fiber diet may control cholesterol levels.  Take any prescribed medicines to control cholesterol as directed by your caregiver.  Managing your diabetes.  A controlled-carbohydrate, controlled-sugar diet is recommended to manage diabetes.  Take any prescribed medicines to control diabetes as directed by your caregiver.  Controlling your high blood pressure (hypertension).  A low-salt (sodium), low-saturated fat, low-trans fat, and low-cholesterol diet is recommended to manage high blood pressure.  Take any prescribed medicines to control hypertension as directed by your caregiver.  Maintaining a healthy weight.  A reduced-calorie, low-sodium, low-saturated fat, low-trans fat, low-cholesterol diet is recommended to manage weight.  Stopping drug abuse.  Avoiding birth control pills.  Talk to your caregiver about the risks of taking birth control pills if you are over 35 years old, smoke, get migraines, or have ever had a blood clot.  Getting evaluated for sleep disorders (sleep apnea).  Talk to your caregiver about getting a sleep evaluation  if you snore a lot or have excessive sleepiness.  Taking medicines as directed by your caregiver.  For some people, aspirin or blood thinners (anticoagulants) are helpful in reducing the risk of forming abnormal blood clots that can lead to stroke. If you have the irregular heart rhythm of atrial fibrillation, you should be on a blood thinner unless there is a good reason you cannot take them.  Understand all your medicine instructions. SEEK IMMEDIATE MEDICAL CARE IF:   You have sudden weakness or numbness of the face, arm, or leg, especially on one side of the body.  You have sudden confusion.  You have trouble speaking (aphasia) or understanding.  You have sudden trouble seeing in one or both eyes.  You have sudden trouble walking.  You have dizziness.  You have a loss of balance or coordination.  You have a sudden, severe headache with no known cause.  You have new chest pain or an irregular heartbeat. Any of these symptoms may represent a serious problem that is an emergency. Do not wait to see if the symptoms will go away. Get medical help right away. Call your local emergency services (911 in [place] Do not drive yourself to the hospital. Document Released: 02/25/2004 Document Revised: 04/11/2011 Document Reviewed: 07/20/2012 [employer] Patient Information 2014 [employer].  

## 2012-12-21 NOTE — Progress Notes (Signed)
Established Carotid Patient  Previous Carotid surgery: No  History of Present Illness  [name] [name] is a 79 y.o. male patient of Dr. Candie Chroman with no history of carotid intervention, he returns today for carotid artery surveillance.  Patient has Negative history of TIA or stroke symptom.  The patient denies amaurosis fugax or monocular blindness.  The patient  denies facial drooping.  Pt. denies hemiplegia.  The patient denies receptive or expressive aphasia.  Pt. denies extremity weakness. Patient denies claudication symptoms, denies non-healing wounds.  Patient reports New Medical or Surgical History: lymph node removed on the left side of his neck, was not melanoma. Had 3 vessel CABG in 2005, denies MI.  Pt Diabetic: No Pt smoker: former smoker, quit 50 years ago  Pt meds include: Statin : No: is statin intolerant  Betablocker: Yes ASA: Yes Other anticoagulants/antiplatelets: coumadin, for atrial fib.   Past Medical History  Diagnosis Date  . Sleep apnea   . Atrial fibrillation   . SSS (sick sinus syndrome)   . Coronary artery disease   . Hypertension   . Hyperlipidemia   . Pulmonary hypertension   . History of interstitial lung disease   . Diverticulosis   . Esophageal dilatation   . Hypothyroidism   . Gout   . Myocardial infarction   . Pacemaker   . SOB (shortness of breath) on exertion     sees dr. Delton Coombes occassionally  . GERD (gastroesophageal reflux disease)   . Arthritis     neck  . Cancer 08/17/12 bx    neck,left  . Allergy     codeine    Social History History  Substance Use Topics  . Smoking status: Former Smoker -- 1.00 packs/day for 15 years    Types: Cigarettes    Quit date: 09/20/1980  . Smokeless tobacco: Never Used  . Alcohol Use: 1.2 oz/week    2 Glasses of wine per week     Comment: 2 glasses of wine daily    Family History Family History  Problem Relation Age of Onset  . Hypertension Mother   . Hyperlipidemia Mother   . Stroke  Father   . Coronary artery disease Brother   . Hypertension Brother   . Hyperlipidemia Brother     Surgical History Past Surgical History  Procedure Laterality Date  . Cardiac catheterization  08/08/2006    EF 50%  . Cardiac catheterization  07/26/2003    EF 40%  . Insert / replace / remove pacemaker      IMPLANT  . Coronary artery bypass graft  08/06/2003    3Vessel LIMA GRAFT TO THE LAD, SAPHENOUS VEIN GRAFT TO THE CIRCUMFLEX, SAPHENOUS VEIN GRAFT TO THE RIGHT POSTERIOR LATERAL BRANCH  . Mitral and tricuspid valve annuloplasty  08/06/2003    Dr. Cornelius Moras  . Polypectomy    . Cataract extraction    . Cox-maze microwave ablation    . US echocardiography  07/12/2006    EF 50-55%  . US echocardiography  12/16/2003    EF 55-60%  . Cardiovascular stress test  07/11/2006    EF 50%  . Mass excision N/A 04/20/2012    Procedure: EXCISION OF LOWER LIP CANCER AND EXCISION SUBMENTAL LYMPH NODES;  Surgeon: [name], MD;  Location: [facility] OR;  Service: ENT;  Laterality: N/A;  . Radical neck dissection Left 08/17/2012    Procedure: LEFT NECK DISSECTION;  Surgeon: [name], MD;  Location: [facility] OR;  Service: ENT;  Laterality: Left;  Allergies  Allergen Reactions  . Amoxicillin     Severe thrush with crusting and ulceration of back of throat.  . Codeine Nausea Only    Current Outpatient Prescriptions  Medication Sig Dispense Refill  . allopurinol (ZYLOPRIM) 100 MG tablet Take 100 mg by mouth daily.        Marland Kitchen aspirin 81 MG tablet Take 81 mg by mouth daily.        Marland Kitchen atenolol (TENORMIN) 100 MG tablet Take 100 mg by mouth daily.      . benazepril (LOTENSIN) 20 MG tablet Take 20 mg by mouth daily.      . Cholecalciferol (VITAMIN D PO) Take 1 tablet by mouth daily.      . Cyanocobalamin (VITAMIN B 12 PO) Take by mouth daily.      . febuxostat (ULORIC) 40 MG tablet Take 40 mg by mouth daily.       . furosemide (LASIX) 20 MG tablet Take 1 tablet by mouth Daily.      . lansoprazole  (PREVACID) 30 MG capsule Take 30 mg by mouth daily.        Marland Kitchen levothyroxine (SYNTHROID, LEVOTHROID) 100 MCG tablet Take 100 mcg by mouth daily.        . potassium chloride SA (K-DUR,KLOR-CON) 20 MEQ tablet Take 20 mEq by mouth every Monday, Wednesday, and Friday.       . tamsulosin (FLOMAX) 0.4 MG CAPS Take 0.4 mg by mouth daily.      Marland Kitchen warfarin (COUMADIN) 5 MG tablet Take 5 mg by mouth daily. Takes 1/2 tablet on Wednesday's      . amLODipine (NORVASC) 5 MG tablet daily.       No current facility-administered medications for this visit.    Review of Systems : [x]  Positive   [ ]  Denies  General:[ ]  Weight loss,  [ ]  Weight gain, [ ]  Loss of appetite, [ ]  Fever, [ ]  chills  Neurologic: [ ]  Dizziness, [ ]  Blackouts, [ ]  Headaches, [ ]  Seizure [ ]  Stroke, [ ]  "Mini stroke", [ ]  Slurred speech, [ ]  Temporary blindness;  [ ] weakness,  Ear/Nose/Throat: [ ]  Change in hearing, [ ]  Nose bleeds, [ ]  Hoarseness  Vascular:[ ]  Pain in legs with walking, [ ]  Pain in feet while lying flat , [ ]   Non-healing ulcer, [ ]  Blood clot in vein,    Pulmonary: [ ]  Home oxygen, [ ]   Productive cough, [ ]  Bronchitis, [ ]  Coughing up blood,  [ ]  Asthma, [ ]  Wheezing  Musculoskeletal:  [ ]  Arthritis, [ ]  Joint pain, [ ]  low back pain  Cardiac: [ ]  Chest pain, [ ]  Shortness of breath when lying flat, [ ]  Shortness of breath with exertion, [ ]  Palpitations, [ ]  Heart murmur, [ ]   Atrial fibrillation  Hematologic:[ ]  Easy Bruising, [ ]  Anemia; [ ]  Hepatitis  Psychiatric: [ ]   Depression, [ ]  Anxiety   Gastrointestinal: [ ]  Black stool, [ ]  Blood in stool, [ ]  Peptic ulcer disease,  [ ]  Gastroesophageal Reflux, [ ]  Trouble swallowing, [ ]  Diarrhea, [ ]  Constipation  Urinary: [ ]  chronic Kidney disease, [ ]  on HD, [ ]  Burning with urination, [ ]  Frequent urination, [ ]  Difficulty urinating;   Skin: [ ]  Rashes, [ ]  Wounds    Physical Examination  Filed Vitals:   12/21/12 1223  BP: 128/81  Pulse: 76   Filed  Weights   12/21/12 1220  Weight: 164 lb 3.2  oz (74.481 kg)   Body mass index is 22.91 kg/(m^2).   General: WDWN male in NAD GAIT: normal Eyes: Pupils = Pulmonary:  CTAB, Negative  Rales, Negative rhonchi, & Negative wheezing.  Cardiac: regular Rhythm ,  Negative Murmurs detected, pacemaker noted subcutaneous on right upper chest.  VASCULAR EXAM Carotid Bruits Left Right   Negative Negative    Aorta is not palpable. Radial pulses are 3+ palpable and equal.                                                                                                                            LE Pulses LEFT RIGHT       POPLITEAL  not palpable   not palpable    Gastrointestinal: soft, nontender, BS WNL, no r/g,  negative masses.  Musculoskeletal: Negative muscle atrophy/wasting. M/S 5/5 throughout, Extremities without ischemic changes.  Neurologic: A&O X 3; Appropriate Affect ; SENSATION ;normal;  Speech is normal CN 2-12 intact, Pain and light touch intact in extremities, Motor exam as listed above.   Non-Invasive Vascular Imaging CAROTID DUPLEX 12/21/2012   Right ICA: <40% stenosis. Left ICA: 40 - 59 % stenosis.   These findings are Unchanged from previous exam a year ago.  Assessment: [name] [name] is a 79 y.o. male who presents with asymptomatic minimal plaque in right ICA and mild/moderate stenosis of left ICA. The  ICA stenosis is  Unchanged from previous exam.  Plan: Follow-up in 1 year with Carotid Duplex scan.   I discussed in depth with the patient the nature of atherosclerosis, and emphasized the importance of maximal medical management including strict control of blood pressure, blood glucose, and lipid levels, obtaining regular exercise, and continued cessation of smoking.  The patient is aware that without maximal medical management the underlying atherosclerotic disease process will progress, limiting the benefit of any interventions. The patient was given  information about stroke prevention and what symptoms should prompt the patient to seek immediate medical care. Thank you for allowing [place] to participate in this patient's care.  Charisse March, RN, MSN, FNP-C [facility] Office: [phone]  Clinic Physician: Imogene Burn  12/21/2012 12:39 PM

## 2012-12-26 ENCOUNTER — Other Ambulatory Visit: Payer: Self-pay | Admitting: Otolaryngology

## 2012-12-26 NOTE — Addendum Note (Signed)
Addended by: Melodye Ped C on: 12/26/2012 10:26 AM   Modules accepted: Orders

## 2013-01-01 ENCOUNTER — Other Ambulatory Visit: Payer: Self-pay | Admitting: Cardiology

## 2013-03-15 ENCOUNTER — Encounter: Payer: Self-pay | Admitting: Internal Medicine

## 2013-03-18 ENCOUNTER — Other Ambulatory Visit: Payer: Self-pay | Admitting: Internal Medicine

## 2013-03-18 DIAGNOSIS — R945 Abnormal results of liver function studies: Secondary | ICD-10-CM

## 2013-03-18 DIAGNOSIS — R7989 Other specified abnormal findings of blood chemistry: Secondary | ICD-10-CM

## 2013-03-22 ENCOUNTER — Other Ambulatory Visit: Payer: Self-pay

## 2013-03-25 ENCOUNTER — Ambulatory Visit
Admission: RE | Admit: 2013-03-25 | Discharge: 2013-03-25 | Disposition: A | Discharge status: Home / Self Care | Payer: Medicare Other | Source: Ambulatory Visit | Attending: Internal Medicine | Admitting: Internal Medicine

## 2013-03-25 DIAGNOSIS — R7989 Other specified abnormal findings of blood chemistry: Secondary | ICD-10-CM

## 2013-03-25 DIAGNOSIS — R945 Abnormal results of liver function studies: Secondary | ICD-10-CM

## 2013-05-13 ENCOUNTER — Ambulatory Visit (INDEPENDENT_AMBULATORY_CARE_PROVIDER_SITE_OTHER): Payer: Medicare Other | Admitting: Internal Medicine

## 2013-05-13 ENCOUNTER — Encounter: Payer: Self-pay | Admitting: Internal Medicine

## 2013-05-13 ENCOUNTER — Other Ambulatory Visit: Payer: Self-pay | Admitting: Internal Medicine

## 2013-05-13 VITALS — BP 128/64 | HR 65 | Ht 71.0 in | Wt 163.0 lb

## 2013-05-13 DIAGNOSIS — I4891 Unspecified atrial fibrillation: Secondary | ICD-10-CM

## 2013-05-13 DIAGNOSIS — I442 Atrioventricular block, complete: Secondary | ICD-10-CM

## 2013-05-13 DIAGNOSIS — Z950 Presence of cardiac pacemaker: Secondary | ICD-10-CM

## 2013-05-13 LAB — MDC_IDC_ENUM_SESS_TYPE_INCLINIC
Battery Voltage: 2.56 V
Brady Statistic RV Percent Paced: 99 %
Date Time Interrogation Session: 20150413122200
Lead Channel Impedance Value: 67 Ohm
Lead Channel Setting Sensing Sensitivity: 2 mV
MDC IDC MSMT BATTERY IMPEDANCE: 8825 Ohm
MDC IDC MSMT BATTERY REMAINING LONGEVITY: -1 mo
MDC IDC MSMT LEADCHNL RV IMPEDANCE VALUE: 734 Ohm
MDC IDC SET LEADCHNL RV PACING AMPLITUDE: 2.5 V
MDC IDC SET LEADCHNL RV PACING PULSEWIDTH: 0.4 ms

## 2013-05-13 NOTE — Patient Instructions (Signed)
Your physician recommends that you continue on your current medications as directed. Please refer to the Current Medication list given to you today.  Your device battery is getting low and will need to schedule generator change -- Zoanne Newill, RN will call you to schedule this procedure.  Your physician has requested that you have a lexiscan myoview. For further information please visit [email or web address]. Please follow instruction sheet, as given.   Pharmacologic Stress Electrocardiogram A pharmacologic stress electrocardiogram is a heart (cardiac) test that uses nuclear imaging to evaluate the blood supply to your heart. This test may also be called a pharmacologic stress electrocardiography. Pharmacologic means that a medicine is used to increase your heart rate and blood pressure.  This stress test is done to find areas of poor blood flow to the heart by determining the extent of coronary artery disease (CAD). Some people exercise on a treadmill, which naturally increases the blood flow to the heart. For those people unable to exercise on a treadmill, a medicine is used. This medicine stimulates your heart and will cause your heart to beat harder and more quickly, as if you were exercising.  Pharmacologic stress tests can help determine:  The adequacy of blood flow to your heart during increased levels of activity in order to clear you for discharge home.  The extent of coronary artery blockage caused by CAD.  Your prognosis if you have suffered a heart attack.  The effectiveness of cardiac procedures done, such as an angioplasty, which can increase the circulation in your coronary arteries.  Causes of chest pain or pressure. LET [facility] CARE PROVIDER KNOW ABOUT:  Any allergies you have.  All medicines you are taking, including vitamins, herbs, eye drops, creams, and over-the-counter medicines.  Previous problems you or members of your family have had with the use of  anesthetics.  Any blood disorders you have.  Previous surgeries you have had.  Medical conditions you have.  Possibility of pregnancy, if this applies.  If you are currently breastfeeding. RISKS AND COMPLICATIONS Generally, this is a safe procedure. However, as with any procedure, complications can occur. Possible complications include:  You develop pain or pressure in the following areas:  Chest.  Jaw or neck.  Between your shoulder blades.  Radiating down your left arm.  Headache.  Dizziness or lightheadedness.  Shortness of breath.  Increased or irregular heartbeat.  Low blood pressure.  Nausea or vomiting.  Flushing.  Redness going up the arm and slight pain during injection of medicine.  Heart attack (rare). BEFORE THE PROCEDURE   Avoid all forms of caffeine for 24 hours before your test or as directed by your health care provider. This includes coffee, tea (even decaffeinated tea), caffeinated sodas, chocolate, cocoa, and certain pain medicines.  Follow your health care provider's instructions regarding eating and drinking before the test.  Take your medicines as directed at regular times with water unless instructed otherwise. Exceptions may include:  If you have diabetes, ask how you are to take your insulin or pills. It is common to adjust insulin dosing the morning of the test.  If you are taking beta-blocker medicines, it is important to talk to your health care provider about these medicines well before the date of your test. Taking beta-blocker medicines may interfere with the test. In some cases, these medicines need to be changed or stopped 24 hours or more before the test.  If you wear a nitroglycerin patch, it may need to be removed prior to the  test. Ask your health care provider if the patch should be removed before the test.  If you use an inhaler for any breathing condition, bring it with you to the test.  If you are an outpatient, bring a  snack so you can eat right after the stress phase of the test.  Do not smoke for 4 hours prior to the test or as directed by your health care provider.  Do not apply lotions, powders, creams, or oils on your chest prior to the test.  Wear comfortable shoes and clothing. Let your health care provider know if you were unable to complete or follow the preparations for your test. PROCEDURE   Multiple patches (electrodes) will be put on your chest. If needed, small areas of your chest may be shaved to get better contact with the electrodes. Once the electrodes are attached to your body, multiple wires will be attached to the electrodes, and your heart rate will be monitored.  An IV access will be started. A nuclear trace (isotope) is given. The isotope may be given intravenously, or it may be swallowed. Nuclear refers to several types of radioactive isotopes, and the nuclear isotope lights up the arteries so that the nuclear images are clear. The isotope is absorbed by your body. This results in low radiation exposure.  A resting nuclear image is taken to show how your heart functions at rest.  A medicine is given through the IV access.  A second scan is done about 1 hour after the medicine injection and determines how your heart functions under stress.  During this stress phase, you will be connected to an electrocardiogram machine. Your blood pressure and oxygen levels will be monitored. The stress phase usually lasts 5 10 minutes.  The total procedure time is approximately 3 hours. AFTER THE PROCEDURE   Your heart rate and blood pressure will be monitored after the test.  You may return to your normal schedule, including diet,activities, and medicines, unless your health care provider tells you otherwise. Document Released: 06/05/2008 Document Revised: 11/07/2012 Document Reviewed: 09/24/2012 [facility] Patient Information 2014 [place].

## 2013-05-13 NOTE — Progress Notes (Signed)
Patient Care Team: Jerlyn Ly, MD as PCP - General Brooks Sailors, RN as Registered Nurse (Oncology)   HPI  [name] [name] is a 80 y.o. male Seen in followup for pacemaker implanted in the context of permanent atrial fibrillation.  Is a history of bypass surgery with prior tricuspid and mitral valve annuloplasty. In 2008 he underwent catheterization because of dyspnea. Bypasses were patent and left ventricular function was normal  He denies chest pain. He has chronic shortness of breath. There is no edema. He is noted no change in exercise performance is having reached ERI and reverted to 65  Past Medical History  Diagnosis Date  . Sleep apnea   . Atrial fibrillation   . SSS (sick sinus syndrome)   . Coronary artery disease   . Hypertension   . Hyperlipidemia   . Pulmonary hypertension   . History of interstitial lung disease   . Diverticulosis   . Esophageal dilatation   . Hypothyroidism   . Gout   . Myocardial infarction   . Pacemaker   . SOB (shortness of breath) on exertion     sees dr. Lamonte Sakai occassionally  . GERD (gastroesophageal reflux disease)   . Arthritis     neck  . Cancer 08/17/12 bx    neck,left  . Allergy     codeine    Past Surgical History  Procedure Laterality Date  . Cardiac catheterization  08/08/2006    EF 50%  . Cardiac catheterization  07/26/2003    EF 40%  . Insert / replace / remove pacemaker      IMPLANT  . Coronary artery bypass graft  08/06/2003    3Vessel LIMA GRAFT TO THE LAD, SAPHENOUS VEIN GRAFT TO THE CIRCUMFLEX, SAPHENOUS VEIN GRAFT TO THE RIGHT POSTERIOR LATERAL BRANCH  . Mitral and tricuspid valve annuloplasty  08/06/2003    Dr. Roxy Manns  . Polypectomy    . Cataract extraction    . Cox-maze microwave ablation    . US echocardiography  07/12/2006    EF 50-55%  . US echocardiography  12/16/2003    EF 55-60%  . Cardiovascular stress test  07/11/2006    EF 50%  . Mass excision N/A 04/20/2012    Procedure: EXCISION OF  LOWER LIP CANCER AND EXCISION SUBMENTAL LYMPH NODES;  Surgeon: Rozetta Nunnery, MD;  Location: [facility];  Service: ENT;  Laterality: N/A;  . Radical neck dissection Left 08/17/2012    Procedure: LEFT NECK DISSECTION;  Surgeon: Rozetta Nunnery, MD;  Location: [facility];  Service: ENT;  Laterality: Left;    Current Outpatient Prescriptions  Medication Sig Dispense Refill  . allopurinol (ZYLOPRIM) 100 MG tablet Take 50 mg by mouth daily.       Marland Kitchen amLODipine (NORVASC) 5 MG tablet TAKE 1 TABLET BY MOUTH DAILY.  90 tablet  3  . aspirin 81 MG tablet Take 81 mg by mouth daily.        Marland Kitchen atenolol (TENORMIN) 100 MG tablet Take 100 mg by mouth daily.      . benazepril (LOTENSIN) 20 MG tablet Take 20 mg by mouth daily.      . Cholecalciferol (VITAMIN D PO) Take 1 tablet by mouth daily.      . Cyanocobalamin (VITAMIN B 12 PO) Take by mouth daily.      . febuxostat (ULORIC) 40 MG tablet Take 40 mg by mouth daily.       . furosemide (LASIX) 20 MG  tablet Take 1 tablet by mouth Daily.      . lansoprazole (PREVACID) 30 MG capsule Take 30 mg by mouth daily.        Marland Kitchen levothyroxine (SYNTHROID, LEVOTHROID) 100 MCG tablet Take 100 mcg by mouth daily.        . potassium chloride SA (K-DUR,KLOR-CON) 20 MEQ tablet Take 20 mEq by mouth every Monday, Wednesday, and Friday.       . tamsulosin (FLOMAX) 0.4 MG CAPS Take 0.4 mg by mouth daily.      Marland Kitchen warfarin (COUMADIN) 5 MG tablet Take 5 mg by mouth daily. Takes 1/2 tablet on Wednesday's       No current facility-administered medications for this visit.    Allergies  Allergen Reactions  . Amoxicillin     Severe thrush with crusting and ulceration of back of throat.  . Codeine Nausea Only    Review of Systems negative except from HPI and PMH  Physical Exam BP 128/64  Pulse 65  Ht 5\' 11"  (1.803 m)  Wt 163 lb (73.936 kg)  BMI 22.74 kg/m2 Well developed and nourished in no acute distress HENT normal Neck supple with JVP-flat Clear Device pocket well healed;  without hematoma or erythema.  There is no tethering   Regular rate and rhythm, no murmurs or gallops Abd-soft with active BS No Clubbing cyanosis edema Skin-warm and dry A & Oriented  Grossly normal sensory and motor function     Assessment and  Plan  Tachybradycardia syndrome  Atrial fibrillation-permanent  Ischemic heart disease prior bypass with previously normal LV function  Pacemaker-Medtronic  The patient's device was interrogated.  The information was reviewed. No changes were made in the programming.    The patient has reached ERI.  We have reviewed the benefits and risks of generator replacement.  These include but are not limited to lead fracture and infection.  The patient understands, agrees and is willing to proceed.     As it is been years since we have looked in his LV function and his coronary perfusion will undertake Myoview in anticipation.

## 2013-05-15 ENCOUNTER — Telehealth: Payer: Self-pay | Admitting: *Deleted

## 2013-05-15 NOTE — Telephone Encounter (Signed)
Left message on personal answering machine to schedule PPM generator change

## 2013-05-16 ENCOUNTER — Encounter: Payer: Self-pay | Admitting: Internal Medicine

## 2013-05-16 ENCOUNTER — Encounter: Payer: Self-pay | Admitting: *Deleted

## 2013-05-16 ENCOUNTER — Other Ambulatory Visit: Payer: Self-pay | Admitting: *Deleted

## 2013-05-16 DIAGNOSIS — Z01812 Encounter for preprocedural laboratory examination: Secondary | ICD-10-CM

## 2013-05-16 DIAGNOSIS — Z45010 Encounter for checking and testing of cardiac pacemaker pulse generator [battery]: Secondary | ICD-10-CM

## 2013-05-16 DIAGNOSIS — I442 Atrioventricular block, complete: Secondary | ICD-10-CM

## 2013-05-16 NOTE — Telephone Encounter (Signed)
Follow up     Returning call back to nurse from this am

## 2013-05-16 NOTE — Telephone Encounter (Signed)
Follow Up  Pt returning call back from the nurse the AM

## 2013-05-16 NOTE — Telephone Encounter (Signed)
Woman answered phone, states pt is at barbershop.   Left message to return my phone call

## 2013-05-16 NOTE — Telephone Encounter (Signed)
PPM generator change scheduled for 5/14, pre procedure labs 5/7, wound check 5/28. Instructions reviewed with patient and letter mailed to home address. Patient verbalized understanding and agreeable to plan.

## 2013-05-28 ENCOUNTER — Telehealth: Payer: Self-pay | Admitting: Internal Medicine

## 2013-05-28 NOTE — Telephone Encounter (Signed)
New message     Please call pt and go over procedure of having his battery replaced in his pacer/defib device.  Will he stay over night?

## 2013-05-28 NOTE — Telephone Encounter (Signed)
Spoke w/wife in regards to procedure. All questions answered.

## 2013-05-30 ENCOUNTER — Ambulatory Visit: Payer: Self-pay | Admitting: Cardiology

## 2013-05-31 ENCOUNTER — Telehealth: Payer: Self-pay | Admitting: *Deleted

## 2013-05-31 NOTE — Telephone Encounter (Signed)
Left message to call back.  When pt calls back - need to inform him that PPM generator change on 5/14 has been moved up. Please be at hospital at 1pm (not originally scheduled 2pm)

## 2013-06-03 ENCOUNTER — Ambulatory Visit (HOSPITAL_COMMUNITY): Payer: Medicare Other | Attending: Cardiovascular Disease | Admitting: Radiology

## 2013-06-03 VITALS — BP 126/70 | Ht 71.0 in | Wt 160.0 lb

## 2013-06-03 DIAGNOSIS — I442 Atrioventricular block, complete: Secondary | ICD-10-CM

## 2013-06-03 DIAGNOSIS — I4949 Other premature depolarization: Secondary | ICD-10-CM

## 2013-06-03 DIAGNOSIS — Z950 Presence of cardiac pacemaker: Secondary | ICD-10-CM | POA: Insufficient documentation

## 2013-06-03 DIAGNOSIS — I4891 Unspecified atrial fibrillation: Secondary | ICD-10-CM | POA: Insufficient documentation

## 2013-06-03 MED ORDER — ADENOSINE (DIAGNOSTIC) 3 MG/ML IV SOLN: 0.5600 mg/kg | Freq: Once | INTRAVENOUS | Status: DC

## 2013-06-03 MED ORDER — TECHNETIUM TC 99M SESTAMIBI GENERIC - CARDIOLITE: 11.0000 | Freq: Once | INTRAVENOUS | Status: DC | PRN

## 2013-06-03 MED ORDER — TECHNETIUM TC 99M SESTAMIBI GENERIC - CARDIOLITE: 33.0000 | Freq: Once | INTRAVENOUS | Status: DC | PRN

## 2013-06-03 NOTE — Progress Notes (Signed)
[facility] [address] [place], [name] [address] [phone]    Cardiology Nuclear Med Study  GEHRIG PATRAS is a 80 y.o. male     MRN : [id number]     DOB: 1930/09/19  Procedure Date: 06/03/2013  Nuclear Med Background Indication for Stress Test:  Evaluation for Ischemia, Surgical Clearance: Pacemaker generator replacement and Graft Patency History:  CAD, MI-Cath-CABG-Hx-Mitral Tricuspid Annuoplasty, PTVP, AFIB, '08 ECHO: EF: 50-55%, MPI: EF: 50% Cardiac Risk Factors: Carotid Disease, History of Smoking, Hypertension and Lipids  Symptoms:  DOE, Fatigue and Palpitations   Nuclear Pre-Procedure Caffeine/Decaff Intake:  None NPO After: 7:00pm   Lungs:  clear O2 Sat: 95% on room air. IV 0.9% NS with Angio Cath:  22g  IV Site: R Hand  IV Started by:  Matilde Haymaker, RN  Chest Size (in):  36 Cup Size: n/a  Height: 5\' 11"  (1.803 m)  Weight:  160 lb (72.576 kg)  BMI:  Body mass index is 22.33 kg/(m^2). Tech Comments:  n/a    Nuclear Med Study 1 or 2 day study: 1 day  Stress Test Type:  Adenosine  Reading MD: n/a  Order Authorizing Provider:  Herbert Pun  Resting Radionuclide: Technetium 39m Sestamibi  Resting Radionuclide Dose: 11.0 mCi   Stress Radionuclide:  Technetium 48m Sestamibi  Stress Radionuclide Dose: 33.0 mCi           Stress Protocol Rest HR: 65 Stress HR: 65  Rest BP: 126/70 Stress BP: 129/60  Exercise Time (min): n/a METS: n/a   Predicted Max HR: 139 bpm % Max HR: 46.76 bpm Rate Pressure Product: 8385   Dose of Adenosine (mg):  40.7 Dose of Lexiscan: n/a mg  Dose of Atropine (mg): n/a Dose of Dobutamine: n/a mcg/kg/min (at max HR)  Stress Test Technologist: Perrin Maltese, EMT-P  Nuclear Technologist:  Charlton Amor, CNMT     Rest Procedure:  Myocardial perfusion imaging was performed at rest 45 minutes following the intravenous administration of Technetium 17m Sestamibi. Rest ECG: Atrial fibrillation with  ventricular pacing.  Stress Procedure:  The patient received IV adenosine at 140 mcg/kg/min for 4 minutes.  Technetium 65m Sestamibi was injected at the 2 minute mark and quantitative spect images were obtained after a 45 minute delay. Stress ECG: No significant change from baseline ECG  QPS Raw Data Images:  Normal; no motion artifact; normal heart/lung ratio. Stress Images:  There is decreased uptake in the inferiolateral wall and apex Rest Images:  There is decreased uptake in the inferiolateral wall and apex. Subtraction (SDS):  There is a fixed defect that is most consistent with a previous inferolateral and apical scars. No ischemia. Transient Ischemic Dilatation (Normal <1.22):  1.06 Lung/Heart Ratio (Normal <0.45):  0.14  Quantitative Gated Spect Images QGS EDV:  108 ml QGS ESV:  53 ml  Impression Exercise Capacity:  Adenosine study with no exercise. BP Response:  Normal blood pressure response. Clinical Symptoms:  No chest pain. ECG Impression:  Baseline:  LBBB.  EKG uninterpretable due to LBBB at rest and stress. Comparison with Prior Nuclear Study: No images to compare  Overall Impression:  Low risk stress nuclear study. Old small apical scar.  Old small inferolateral scar. No reversible ischemia.  LV Ejection Fraction: 51%.  LV Wall Motion:  Normal Wall Motion  Darlin Coco MD

## 2013-06-06 ENCOUNTER — Encounter (HOSPITAL_COMMUNITY): Payer: Self-pay | Admitting: Pharmacy Technician

## 2013-06-06 ENCOUNTER — Other Ambulatory Visit (INDEPENDENT_AMBULATORY_CARE_PROVIDER_SITE_OTHER): Payer: Medicare Other

## 2013-06-06 DIAGNOSIS — I442 Atrioventricular block, complete: Secondary | ICD-10-CM

## 2013-06-06 DIAGNOSIS — Z45010 Encounter for checking and testing of cardiac pacemaker pulse generator [battery]: Secondary | ICD-10-CM

## 2013-06-06 DIAGNOSIS — Z45018 Encounter for adjustment and management of other part of cardiac pacemaker: Secondary | ICD-10-CM

## 2013-06-06 DIAGNOSIS — Z01812 Encounter for preprocedural laboratory examination: Secondary | ICD-10-CM

## 2013-06-06 LAB — CBC WITH DIFFERENTIAL/PLATELET
BASOS ABS: 0 10*3/uL (ref 0.0–0.1)
Basophils Relative: 0.4 % (ref 0.0–3.0)
EOS ABS: 0.2 10*3/uL (ref 0.0–0.7)
Eosinophils Relative: 3.4 % (ref 0.0–5.0)
HEMATOCRIT: 43.3 % (ref 39.0–52.0)
HEMOGLOBIN: 14.7 g/dL (ref 13.0–17.0)
LYMPHS ABS: 1.7 10*3/uL (ref 0.7–4.0)
Lymphocytes Relative: 30 % (ref 12.0–46.0)
MCHC: 33.9 g/dL (ref 30.0–36.0)
MCV: 99.7 fl (ref 78.0–100.0)
MONO ABS: 0.6 10*3/uL (ref 0.1–1.0)
Monocytes Relative: 9.7 % (ref 3.0–12.0)
NEUTROS ABS: 3.2 10*3/uL (ref 1.4–7.7)
Neutrophils Relative %: 56.5 % (ref 43.0–77.0)
Platelets: 168 10*3/uL (ref 150.0–400.0)
RBC: 4.34 Mil/uL (ref 4.22–5.81)
RDW: 14.2 % (ref 11.5–15.5)
WBC: 5.7 10*3/uL (ref 4.0–10.5)

## 2013-06-06 LAB — PROTIME-INR
INR: 3 ratio — ABNORMAL HIGH (ref 0.8–1.0)
Prothrombin Time: 32.3 s — ABNORMAL HIGH (ref 9.6–13.1)

## 2013-06-06 LAB — BASIC METABOLIC PANEL
BUN: 29 mg/dL — ABNORMAL HIGH (ref 6–23)
CO2: 25 meq/L (ref 19–32)
Calcium: 9.3 mg/dL (ref 8.4–10.5)
Chloride: 105 mEq/L (ref 96–112)
Creatinine, Ser: 1.5 mg/dL (ref 0.4–1.5)
GFR: 48.46 mL/min — ABNORMAL LOW (ref >60.00)
GLUCOSE: 150 mg/dL — AB (ref 70–99)
POTASSIUM: 4.2 meq/L (ref 3.5–5.1)
SODIUM: 138 meq/L (ref 135–145)

## 2013-06-06 NOTE — Telephone Encounter (Signed)
Pt made aware to be at hospital 5/14 at 1pm for gen change. Patient verbalized understanding and agreeable to plan.

## 2013-06-12 MED ORDER — SODIUM CHLORIDE 0.9 % IV SOLN
INTRAVENOUS | Status: DC
  Administered 2013-06-13: 14:00:00 via INTRAVENOUS

## 2013-06-12 MED ORDER — CHLORHEXIDINE GLUCONATE 4 % EX LIQD
60.0000 mL | Freq: Once | CUTANEOUS | Status: DC
  Filled 2013-06-12: qty 60

## 2013-06-12 MED ORDER — SODIUM CHLORIDE 0.9 % IR SOLN
80.0000 mg | Status: AC
  Filled 2013-06-12: qty 2

## 2013-06-12 MED ORDER — VANCOMYCIN HCL IN DEXTROSE 1-5 GM/200ML-% IV SOLN
1000.0000 mg | INTRAVENOUS | Status: AC
  Filled 2013-06-12: qty 200

## 2013-06-13 ENCOUNTER — Telehealth: Payer: Self-pay | Admitting: Internal Medicine

## 2013-06-13 ENCOUNTER — Encounter (HOSPITAL_COMMUNITY)
Admission: RE | Disposition: A | Discharge status: Home / Self Care | Payer: Self-pay | Source: Ambulatory Visit | Attending: Internal Medicine

## 2013-06-13 ENCOUNTER — Ambulatory Visit (HOSPITAL_COMMUNITY)
Admission: RE | Admit: 2013-06-13 | Discharge: 2013-06-13 | Disposition: A | Discharge status: Home / Self Care | Payer: Medicare Other | Source: Ambulatory Visit | Attending: Internal Medicine | Admitting: Internal Medicine

## 2013-06-13 DIAGNOSIS — E785 Hyperlipidemia, unspecified: Secondary | ICD-10-CM | POA: Insufficient documentation

## 2013-06-13 DIAGNOSIS — I2789 Other specified pulmonary heart diseases: Secondary | ICD-10-CM | POA: Insufficient documentation

## 2013-06-13 DIAGNOSIS — Z85819 Personal history of malignant neoplasm of unspecified site of lip, oral cavity, and pharynx: Secondary | ICD-10-CM | POA: Insufficient documentation

## 2013-06-13 DIAGNOSIS — Z8589 Personal history of malignant neoplasm of other organs and systems: Secondary | ICD-10-CM | POA: Insufficient documentation

## 2013-06-13 DIAGNOSIS — Z45018 Encounter for adjustment and management of other part of cardiac pacemaker: Secondary | ICD-10-CM | POA: Insufficient documentation

## 2013-06-13 DIAGNOSIS — I2510 Atherosclerotic heart disease of native coronary artery without angina pectoris: Secondary | ICD-10-CM | POA: Insufficient documentation

## 2013-06-13 DIAGNOSIS — Z881 Allergy status to other antibiotic agents status: Secondary | ICD-10-CM | POA: Insufficient documentation

## 2013-06-13 DIAGNOSIS — I252 Old myocardial infarction: Secondary | ICD-10-CM | POA: Insufficient documentation

## 2013-06-13 DIAGNOSIS — Z7982 Long term (current) use of aspirin: Secondary | ICD-10-CM | POA: Insufficient documentation

## 2013-06-13 DIAGNOSIS — E039 Hypothyroidism, unspecified: Secondary | ICD-10-CM | POA: Insufficient documentation

## 2013-06-13 DIAGNOSIS — Z885 Allergy status to narcotic agent status: Secondary | ICD-10-CM | POA: Insufficient documentation

## 2013-06-13 DIAGNOSIS — Z7901 Long term (current) use of anticoagulants: Secondary | ICD-10-CM | POA: Insufficient documentation

## 2013-06-13 DIAGNOSIS — Z87891 Personal history of nicotine dependence: Secondary | ICD-10-CM | POA: Insufficient documentation

## 2013-06-13 DIAGNOSIS — Z79899 Other long term (current) drug therapy: Secondary | ICD-10-CM | POA: Insufficient documentation

## 2013-06-13 DIAGNOSIS — I4891 Unspecified atrial fibrillation: Secondary | ICD-10-CM | POA: Insufficient documentation

## 2013-06-13 DIAGNOSIS — I442 Atrioventricular block, complete: Secondary | ICD-10-CM | POA: Insufficient documentation

## 2013-06-13 DIAGNOSIS — M109 Gout, unspecified: Secondary | ICD-10-CM | POA: Insufficient documentation

## 2013-06-13 DIAGNOSIS — I498 Other specified cardiac arrhythmias: Secondary | ICD-10-CM

## 2013-06-13 DIAGNOSIS — Z951 Presence of aortocoronary bypass graft: Secondary | ICD-10-CM | POA: Insufficient documentation

## 2013-06-13 DIAGNOSIS — J8410 Pulmonary fibrosis, unspecified: Secondary | ICD-10-CM | POA: Insufficient documentation

## 2013-06-13 DIAGNOSIS — I10 Essential (primary) hypertension: Secondary | ICD-10-CM | POA: Insufficient documentation

## 2013-06-13 HISTORY — PX: PERMANENT PACEMAKER GENERATOR CHANGE: SHX6022

## 2013-06-13 LAB — SURGICAL PCR SCREEN
MRSA, PCR: NEGATIVE
Staphylococcus aureus: POSITIVE — AB

## 2013-06-13 LAB — PROTIME-INR
INR: 1.26 (ref 0.00–1.49)
PROTHROMBIN TIME: 15.5 s — AB (ref 11.6–15.2)

## 2013-06-13 SURGERY — PERMANENT PACEMAKER GENERATOR CHANGE
Anesthesia: LOCAL

## 2013-06-13 MED ORDER — ONDANSETRON HCL 4 MG/2ML IJ SOLN: 4.0000 mg | Freq: Four times a day (QID) | INTRAMUSCULAR | Status: DC | PRN

## 2013-06-13 MED ORDER — MUPIROCIN 2 % EX OINT
TOPICAL_OINTMENT | Freq: Two times a day (BID) | CUTANEOUS | Status: DC
  Administered 2013-06-13: 1 via NASAL
  Filled 2013-06-13: qty 22

## 2013-06-13 MED ORDER — MIDAZOLAM HCL 5 MG/5ML IJ SOLN
INTRAMUSCULAR | Status: AC
  Filled 2013-06-13: qty 5

## 2013-06-13 MED ORDER — FENTANYL CITRATE 0.05 MG/ML IJ SOLN
INTRAMUSCULAR | Status: AC
  Filled 2013-06-13: qty 2

## 2013-06-13 MED ORDER — LIDOCAINE HCL (PF) 1 % IJ SOLN
INTRAMUSCULAR | Status: AC
  Filled 2013-06-13: qty 60

## 2013-06-13 MED ORDER — SODIUM CHLORIDE 0.9 % IV SOLN: INTRAVENOUS | Status: AC

## 2013-06-13 MED ORDER — AMIODARONE HCL IN DEXTROSE 360-4.14 MG/200ML-% IV SOLN
30.0000 mg/h | INTRAVENOUS | Status: DC
  Filled 2013-06-13: qty 200

## 2013-06-13 MED ORDER — AMIODARONE HCL IN DEXTROSE 360-4.14 MG/200ML-% IV SOLN
60.0000 mg/h | INTRAVENOUS | Status: DC
  Filled 2013-06-13 (×2): qty 200

## 2013-06-13 MED ORDER — ACETAMINOPHEN 325 MG PO TABS
325.0000 mg | ORAL_TABLET | ORAL | Status: DC | PRN
  Filled 2013-06-13: qty 2

## 2013-06-13 MED ORDER — MUPIROCIN 2 % EX OINT
TOPICAL_OINTMENT | CUTANEOUS | Status: AC
  Administered 2013-06-13: 1 via NASAL
  Filled 2013-06-13: qty 22

## 2013-06-13 NOTE — Telephone Encounter (Signed)
New message     Having pacer changeout today.  Can he eat after 9am and last coumadin dosage was Monday---no one called him to tell him what to do---is this ok?

## 2013-06-13 NOTE — Telephone Encounter (Signed)
Patient called with questions regarding his Pacemaker Generator Change today. He states he did not receive any further instructions related to the whether to stop Coumadin or not, so he did stop it on Monday, May 11th.  He has eaten breakfast this morning per instructions on his Instruction Letter, but advised him that he should not eat or drink anything further until after his procedure today and he needs to make sure he tells the Anesthesiologist that he did eat a full breakfast. Patient verbalized understanding and agreement.

## 2013-06-13 NOTE — CV Procedure (Signed)
Preoperative diagnosis atrial fib bradycardia  Device at eri Postoperative diagnosis same/ porcelain pocket  Procedure: Generator replacement   Pocket revision  Following informed consent the patient was brought to the electrophysiology laboratory in place of the fluoroscopic table in the supine position after routine prep and drape lidocaine was infiltrated in the region of the previous incision and carried down to later the device pocket using sharp dissection and electrocautery. The pocket was opened the device was freed up and was explanted.  Interrogation of the previously implanted ventricular lead [employer]    demonstrated an R wave of 10  millivolts., and impedance of 514 ohms, and a pacing threshold of 0.6 volts at 0.5 msec.    The previously implanted atrial lead  Was not assessed and placed straight into the device   The leads were inspected. The leads were then attached to a Medtronic  pulse generator, serial number [id number].  THE POCKET was revised to allow for placing of the larger pulse generator this required excavation of teh leads  The pocket was irrigated with antibiotic containing saline solution hemostasis was assured and the leads and the device were placed in the pocket. The wound was then closed in 3 layers in normal fashion.  The patient tolerated the procedure without apparent complication.  [name] [name]

## 2013-06-13 NOTE — Discharge Instructions (Signed)
Pacemaker Battery Change A pacemaker battery usually lasts 4 to 12 years. Once or twice per year, you will be asked to visit your health care provider to have a full evaluation of your pacemaker. When a battery needs to be replaced, the entire pacemaker is replaced so that you can benefit from new circuitry and any new features that have been added to pacemakers. Most often, this procedure is very simple because the leads are already in place.  There are many things that affect how long a pacemaker battery will last, including:   The age of the pacemaker.   The number of leads (1, 2, or 3).   The pacemaker work load. If the pacemaker is helping the heart more often, the battery will not last as long as it would if the pacemaker did not need to help the heart.   Power (voltage) settings. LET [facility] CARE PROVIDER KNOW ABOUT:   Any allergies you have.   All medicines you are taking, including vitamins, herbs, eye drops, creams, and over-the-counter medicines.   Previous problems you or members of your family have had with the use of anesthetics.   Any blood disorders you have.   Previous surgeries you have had, especially since your last pacemaker placement.   Medical conditions you have.   Possible pregnancy, if applicable.  Symptoms of chest pain, trouble breathing, palpitations, lightheadedness, or feelings of an abnormal or irregular heartbeat. RISKS AND COMPLICATIONS  Generally, this is a safe procedure. However, as with any procedure, complications can occur. Possible complications include:   Bleeding.   Bruising of the skin around where the incision was made.   Pain at the incision site.   Pulling apart of the skin at the incision site.   Infection.   Allergic reaction to anesthetics or other medicines used during the procedure.  Diabetics may have a temporary increase in their blood sugar after any surgical procedure.  BEFORE THE PROCEDURE   Wash  all of the skin around the area of the chest where the pacemaker is located.   Ask your health care provider for help with any medicine adjustments before the pacemaker is replaced.   Unless advised otherwise, do not eat or drink after midnight on the night before the procedure. You may drink water to take your medicine as you normally would or as directed. PROCEDURE   After giving medicine to numb the skin, your health care provider will make a cut to reopen the pocket holding the pacemaker.   The old pacemaker will be disconnected from its leads.   The leads will be tested.   If needed, the leads will be replaced. If the leads are functioning properly, the new pacemaker may be connected to the existing leads.  A heart monitor and the pacemaker programmer will be used to make sure that the new pacemaker is working properly.  The incision site is then closed. A dressing is placed over the pacemaker site. The dressing is removed 24 to 48 hours afterwards. AFTER THE PROCEDURE   You will be taken to a recovery area after the new pacemaker implant is completed. Your vital signs such as blood pressure, heart rate, breathing, and oxygen levels will be monitored.  Your health care provider will tell you when you will need to next test your pacemaker or when to return to the office for follow-up for removal of stitches. Document Released: 04/27/2006 Document Revised: 09/19/2012 Document Reviewed: 08/01/2012 [facility] Patient Information 2014 [place].  Discharge home today in 1.0 hours Remove bulky dressing in Am Dermabond will come off in next 10-14 days Keep wound dry until pressure dressing comes off in 48 hrous No Driving x 4 days Wound check in office , to be scheduled prior to release (Order [id number])  Nursing  Order: [id number]   Date: 06/13/2013  Department: [facility]  Ordering/Authorizing: Deboraha Sprang, MD        Panel

## 2013-06-13 NOTE — H&P (Signed)
ELECTROPHYSIOLOGY ADMISSION HISTORY & PHYSICAL   Patient ID: [name] [name] MRN: [id number], DOB/AGE: 78-Sep-1932   Date of Admission: 06/13/2013  Primary Physician: Crist Infante, MD Primary Cardiologist: Martinique, MD Primary EP: Caryl Comes, MD Reason for Admission: Pacemaker generator change  History of Present Illness [name] [name] is an 80 year old man with permanent atrial fibrillation, complete heart block s/p PPM implant, valvular heart disease s/p MV and TV repair 2005, CAD s/p CABG, LVEF 50% and chronic interstitial lung disease and pulmonary hypertension. His PPM battery was recently found to be at [facility]. Therefore he presents today for pacemaker generator change. He has noticed DOE over the last 2-3 months. He reverted to VVI at 65 bpm on 04/01/2013 so this may be related to loss of rate response function. He denies CP. He denies palpitations, dizziness, near syncope or syncope. He denies LE swelling, orthopnea or PND.  Past Medical History Past Medical History  Diagnosis Date  . Sleep apnea   . Atrial fibrillation   . SSS (sick sinus syndrome)   . Coronary artery disease   . Hypertension   . Hyperlipidemia   . Pulmonary hypertension   . History of interstitial lung disease   . Diverticulosis   . Esophageal dilatation   . Hypothyroidism   . Gout   . Myocardial infarction   . Pacemaker   . SOB (shortness of breath) on exertion     sees dr. Lamonte Sakai occassionally  . GERD (gastroesophageal reflux disease)   . Arthritis     neck  . Cancer 08/17/12 bx    neck,left  . Allergy     codeine    Past Surgical History Past Surgical History  Procedure Laterality Date  . Cardiac catheterization  08/08/2006    EF 50%  . Cardiac catheterization  07/26/2003    EF 40%  . Insert / replace / remove pacemaker      IMPLANT  . Coronary artery bypass graft  08/06/2003    3Vessel LIMA GRAFT TO THE LAD, SAPHENOUS VEIN GRAFT TO THE CIRCUMFLEX, SAPHENOUS VEIN GRAFT TO THE RIGHT POSTERIOR LATERAL  BRANCH  . Mitral and tricuspid valve annuloplasty  08/06/2003    Dr. Roxy Manns  . Polypectomy    . Cataract extraction    . Cox-maze microwave ablation    . US echocardiography  07/12/2006    EF 50-55%  . US echocardiography  12/16/2003    EF 55-60%  . Cardiovascular stress test  07/11/2006    EF 50%  . Mass excision N/A 04/20/2012    Procedure: EXCISION OF LOWER LIP CANCER AND EXCISION SUBMENTAL LYMPH NODES;  Surgeon: [name] Nunnery, MD;  Location: [place];  Service: ENT;  Laterality: N/A;  . Radical neck dissection Left 08/17/2012    Procedure: LEFT NECK DISSECTION;  Surgeon: [name] Nunnery, MD;  Location: [place];  Service: ENT;  Laterality: Left;    Allergies/Intolerances Allergies  Allergen Reactions  . Amoxicillin     Severe thrush with crusting and ulceration of back of throat.  . Codeine Nausea Only   Home Medications Medications Prior to Admission  Medication Sig Dispense Refill  . allopurinol (ZYLOPRIM) 100 MG tablet Take 50 mg by mouth daily.       Marland Kitchen amLODipine (NORVASC) 5 MG tablet TAKE 1 TABLET BY MOUTH DAILY.  90 tablet  3  . aspirin 81 MG tablet Take 81 mg by mouth daily.        Marland Kitchen atenolol (TENORMIN) 100 MG tablet Take  100 mg by mouth daily.      . benazepril (LOTENSIN) 20 MG tablet Take 20 mg by mouth daily.      . Cholecalciferol (VITAMIN D PO) Take 1 tablet by mouth daily.      . Cyanocobalamin (VITAMIN B 12 PO) Take 1 tablet by mouth daily.       . febuxostat (ULORIC) 40 MG tablet Take 40 mg by mouth daily.       . furosemide (LASIX) 20 MG tablet Take 1 tablet by mouth Daily.      . lansoprazole (PREVACID) 30 MG capsule Take 30 mg by mouth daily.        Marland Kitchen levothyroxine (SYNTHROID, LEVOTHROID) 100 MCG tablet Take 100 mcg by mouth daily.        . potassium chloride SA (K-DUR,KLOR-CON) 20 MEQ tablet Take 20 mEq by mouth every Monday, Wednesday, and Friday.       . tamsulosin (FLOMAX) 0.4 MG CAPS Take 0.4 mg by mouth daily.      Marland Kitchen warfarin (COUMADIN) 5 MG tablet  Take 5 mg by mouth daily. Take 5 mg daily except take 2.5 mg on mondays       Family History Family History  Problem Relation Age of Onset  . Hypertension Mother   . Hyperlipidemia Mother   . Stroke Father   . Coronary artery disease Brother   . Hypertension Brother   . Hyperlipidemia Brother     Social History History   Social History  . Marital Status: Married    Spouse Name: N/A    Number of Children: 3  . Years of Education: N/A   Occupational History  .     Social History Main Topics  . Smoking status: Former Smoker -- 1.00 packs/day for 15 years    Types: Cigarettes    Quit date: 09/20/1980  . Smokeless tobacco: Never Used  . Alcohol Use: 1.2 oz/week    2 Glasses of wine per week     Comment: 2 glasses of wine daily  . Drug Use: No  . Sexual Activity: Not on file   Other Topics Concern  . Not on file   Social History Narrative  . No narrative on file     Review of Systems General: No chills, fever, night sweats or weight changes.  Cardiovascular: No chest pain, dyspnea on exertion, edema, orthopnea, palpitations, paroxysmal nocturnal dyspnea. Dermatological: No rash, lesions or masses. Respiratory: No cough, dyspnea. Urologic: No hematuria, dysuria. Abdominal: No nausea, vomiting, diarrhea, bright red blood per rectum, melena, or hematemesis. Neurologic: No visual changes, weakness, changes in mental status. All other systems reviewed and are otherwise negative except as noted above.  Physical Exam Vitals: Blood pressure 148/59, pulse 63, temperature 97.4 F (36.3 C), temperature source Oral, resp. rate 20, height 5\' 11"  (1.803 m), weight 163 lb (73.936 kg), SpO2 95.00%.  General: Well developed, well appearing 80 y.o. male in no acute distress. HEENT: Normocephalic, atraumatic. EOMs intact. Sclera nonicteric. Oropharynx clear.  Neck: Supple. No JVD. Lungs: Respirations regular and unlabored, CTA bilaterally. No wheezes, rales or rhonchi. Heart:  Regular. S1, S2 present. No murmurs, rub, S3 or S4. Abdomen: Soft, non-tender, non-distended. BS present x 4 quadrants.  Extremities: No clubbing, cyanosis or edema. DP/PT/Radials 2+ and equal bilaterally. Psych: Normal affect. Neuro: Alert and oriented X 3. Moves all extremities spontaneously. Musculoskeletal: No kyphosis. Skin: Intact. Warm and dry. No rashes or petechiae in exposed areas. Right upper chest / implant site intact and  well healed.   Labs Lab Results  Component Value Date   WBC 5.7 06/06/2013   HGB 14.7 06/06/2013   HCT 43.3 06/06/2013   MCV 99.7 06/06/2013   PLT 168.0 06/06/2013      Component Value Date/Time   NA 138 06/06/2013 0955   K 4.2 06/06/2013 0955   CL 105 06/06/2013 0955   CO2 25 06/06/2013 0955   GLUCOSE 150* 06/06/2013 0955   BUN 29* 06/06/2013 0955   CREATININE 1.5 06/06/2013 0955   CALCIUM 9.3 06/06/2013 0955   GFRNONAA 44* 08/16/2012 1027   GFRAA 51* 08/16/2012 1027   INR on 06/06/2013 - 3.0  Radiology/Studies No results found.  Echocardiogram none in EPIC since 2005 LEFT VENTRICLE: - Left ventricular size was normal. - Left ventricular ejection fraction was estimated, range being 45-50 %. - Left ventricular wall thickness was normal. AORTIC VALVE: - The aortic valve was trileaflet. - Aortic valve thickness was normal. - There was normal aortic valve leaflet excursion. Doppler interpretation(s): - Transaortic velocity was within the normal range. - There was no evidence for aortic valve stenosis. - There was no significant aortic valvular regurgitation. AORTA: - The aortic root was normal in size. MITRAL VALVE: - MV Annuloplasty ring noted. Mildly elevated transmitral gradient. Normal MVA by PHT. Mean transmitral gradient was 6.1 mmHg. Mitral valve area by pressure half-time was 2.24 cm^2. Doppler interpretation(s): - There was no significant mitral valvular regurgitation. LEFT ATRIUM: - The left atrium was mildly dilated. RIGHT VENTRICLE: - Pacemaker  lead seen in RV. - Right ventricular size was normal. - Right ventricular systolic function was normal. - Right ventricular wall thickness was normal. TRICUSPID VALVE: - The tricuspid valve was grossly normal. Doppler interpretation(s): - There was mild tricuspid valvular regurgitation. RIGHT ATRIUM: - Right atrial size was normal. SYSTEMIC VEINS: - The inferior vena cava was normal. PERICARDIUM: - There was a left pleural effusion.  Recent Lexiscan Myoview stress test 06/03/2013 Impression  Exercise Capacity: Adenosine study with no exercise.  BP Response: Normal blood pressure response.  Clinical Symptoms: No chest pain.  ECG Impression: Baseline: LBBB. EKG uninterpretable due to LBBB at rest and stress.  Comparison with Prior Nuclear Study: No images to compare  Overall Impression: Low risk stress nuclear study. Old small apical scar. Old small inferolateral scar. No reversible ischemia.  LV Ejection Fraction: 51%. LV Wall Motion: Normal Wall Motion  12-lead ECG pending  Assessment and Plan Pacemaker battery at [facility] Complete heart block s/p PPM implant Permanent atrial fibrillation CAD s/p CABG Valvular heart disease s/p MV and TV repair LVEF 50%  Mr. Voland' PPM battery reached ERI on 04/01/2013. We discussed need for PPM generator change and the procedure involved including risks and benefits. Risks, benefits and alternatives to PPM generator change were reviewed in detail. These risks include, but are not limited to, lead dislodgement, bleeding and infection. Mr. Ayala expressed verbal understanding and agrees to proceed.   Currie Paris Gladys Gutman, PA-C 06/13/2013, 1:02 PM

## 2013-06-14 ENCOUNTER — Ambulatory Visit: Payer: Self-pay | Admitting: Cardiology

## 2013-06-17 HISTORY — PX: CARDIOVASCULAR STRESS TEST: SHX262

## 2013-06-27 ENCOUNTER — Ambulatory Visit: Payer: Self-pay

## 2013-06-28 ENCOUNTER — Ambulatory Visit (INDEPENDENT_AMBULATORY_CARE_PROVIDER_SITE_OTHER): Payer: Medicare Other | Admitting: *Deleted

## 2013-06-28 DIAGNOSIS — I4891 Unspecified atrial fibrillation: Secondary | ICD-10-CM

## 2013-06-28 DIAGNOSIS — I442 Atrioventricular block, complete: Secondary | ICD-10-CM

## 2013-06-28 LAB — MDC_IDC_ENUM_SESS_TYPE_INCLINIC
Battery Remaining Longevity: 14.5
Battery Voltage: 2.8 V
Brady Statistic RV Percent Paced: 97.5 %
Lead Channel Impedance Value: 759 Ohm
Lead Channel Pacing Threshold Amplitude: 0.5 V
Lead Channel Pacing Threshold Pulse Width: 0.4 ms
Lead Channel Setting Pacing Amplitude: 2.5 V
Lead Channel Setting Pacing Pulse Width: 0.4 ms
Lead Channel Setting Sensing Sensitivity: 2.8 mV

## 2013-06-28 NOTE — Progress Notes (Signed)
Wound check appointment. Wound without redness. Hematoma present---pt remained on warfarin thoughout procedure. Incision edges approximated, wound well healed. Patient informed to call if he notices any changes in pocket before next ov. Normal device function. Thresholds, sensing, and impedances consistent with implant measurements. Device programmed at appropriate safety margins. Histogram distribution appropriate for patient and level of activity. Permanent AF + Warfarin. No high ventricular rates noted. Patient educated about wound care and arm mobility. ROV on 6-4 for a hematoma recheck.

## 2013-07-01 ENCOUNTER — Telehealth: Payer: Self-pay | Admitting: Internal Medicine

## 2013-07-01 NOTE — Telephone Encounter (Signed)
New message     Pt states blood is around his pacemaker site----will he need to be seen sooner

## 2013-07-01 NOTE — Telephone Encounter (Signed)
Pt thinks there may be changes to site. Pt scheduled for 07-02-13 @ 900 for wound recheck.

## 2013-07-02 ENCOUNTER — Ambulatory Visit (INDEPENDENT_AMBULATORY_CARE_PROVIDER_SITE_OTHER): Payer: Medicare Other | Admitting: *Deleted

## 2013-07-02 DIAGNOSIS — Z950 Presence of cardiac pacemaker: Secondary | ICD-10-CM

## 2013-07-02 LAB — MDC_IDC_ENUM_SESS_TYPE_INCLINIC

## 2013-07-02 NOTE — Progress Notes (Signed)
Hematoma re-check only. No temperature variations, no drainage or bleeding. Hematoma showing discoloration. Okay per SK. [facility] 09/23/13 & ROV w/ Dr. Caryl Comes in 42mo.

## 2013-07-04 ENCOUNTER — Telehealth: Payer: Self-pay | Admitting: *Deleted

## 2013-07-04 ENCOUNTER — Ambulatory Visit: Payer: Self-pay

## 2013-07-04 ENCOUNTER — Ambulatory Visit (INDEPENDENT_AMBULATORY_CARE_PROVIDER_SITE_OTHER): Payer: Medicare Other | Admitting: *Deleted

## 2013-07-04 DIAGNOSIS — I495 Sick sinus syndrome: Secondary | ICD-10-CM

## 2013-07-04 NOTE — Progress Notes (Signed)
Pt seen in clinic for bleeding at site. Per SK pressure dressing to be applied. Pt to stop Coumadin until Monday. Pt to have wound recheck 07-08-13 @ 900.

## 2013-07-04 NOTE — Telephone Encounter (Signed)
Error - no phone intended.

## 2013-07-08 ENCOUNTER — Ambulatory Visit (INDEPENDENT_AMBULATORY_CARE_PROVIDER_SITE_OTHER): Payer: Medicare Other | Admitting: *Deleted

## 2013-07-08 DIAGNOSIS — I495 Sick sinus syndrome: Secondary | ICD-10-CM

## 2013-07-08 NOTE — Progress Notes (Signed)
JA evaluated site after pressure dressing removed. Per JA pt to return on 07-11-13 for SK to evaluate. Pt still off Warfarin till 07-11-13 when decision to be made to restart Warfarin. Pt scheduled for 07-11-13 @ 900.

## 2013-07-11 ENCOUNTER — Ambulatory Visit (INDEPENDENT_AMBULATORY_CARE_PROVIDER_SITE_OTHER): Payer: Medicare Other | Admitting: *Deleted

## 2013-07-11 DIAGNOSIS — Z950 Presence of cardiac pacemaker: Secondary | ICD-10-CM

## 2013-07-11 NOTE — Progress Notes (Addendum)
Wound re-check only, no device interrogation. Wound continuing to heal better, inspected by Dr. Caryl Comes. Pt instructed to recommence coumadin on 07/21/13. Pt will call device clinic if additional bleeding or oozing occurs.

## 2013-07-12 ENCOUNTER — Encounter: Payer: Self-pay | Admitting: Internal Medicine

## 2013-07-19 ENCOUNTER — Ambulatory Visit: Payer: Self-pay | Admitting: Cardiology

## 2013-07-30 ENCOUNTER — Telehealth: Payer: Self-pay | Admitting: *Deleted

## 2013-07-30 ENCOUNTER — Other Ambulatory Visit: Payer: Self-pay | Admitting: Dermatology

## 2013-07-30 ENCOUNTER — Ambulatory Visit (INDEPENDENT_AMBULATORY_CARE_PROVIDER_SITE_OTHER): Payer: Medicare Other | Admitting: Cardiology

## 2013-07-30 ENCOUNTER — Encounter: Payer: Self-pay | Admitting: Cardiology

## 2013-07-30 VITALS — BP 140/66 | HR 80 | Ht 71.0 in | Wt 162.0 lb

## 2013-07-30 DIAGNOSIS — I2510 Atherosclerotic heart disease of native coronary artery without angina pectoris: Secondary | ICD-10-CM

## 2013-07-30 DIAGNOSIS — I658 Occlusion and stenosis of other precerebral arteries: Secondary | ICD-10-CM

## 2013-07-30 DIAGNOSIS — I6529 Occlusion and stenosis of unspecified carotid artery: Secondary | ICD-10-CM

## 2013-07-30 DIAGNOSIS — I482 Chronic atrial fibrillation, unspecified: Secondary | ICD-10-CM

## 2013-07-30 DIAGNOSIS — I4891 Unspecified atrial fibrillation: Secondary | ICD-10-CM

## 2013-07-30 DIAGNOSIS — I10 Essential (primary) hypertension: Secondary | ICD-10-CM

## 2013-07-30 DIAGNOSIS — Z950 Presence of cardiac pacemaker: Secondary | ICD-10-CM

## 2013-07-30 DIAGNOSIS — I6523 Occlusion and stenosis of bilateral carotid arteries: Secondary | ICD-10-CM

## 2013-07-30 DIAGNOSIS — E785 Hyperlipidemia, unspecified: Secondary | ICD-10-CM

## 2013-07-30 NOTE — Telephone Encounter (Signed)
Called Dr. Silvestre Mesi office and requested pt's lipid/cholesterol profile , left message with medical records

## 2013-07-30 NOTE — Assessment & Plan Note (Signed)
Permanent a fib on coumadin followed by PCP

## 2013-07-30 NOTE — Assessment & Plan Note (Signed)
Status post CABG in July of 2005. This included an LIMA graft to the LAD, saphenous vein graft to the circumflex, saphenous vein graft to the right posterior lateral branch. He also had mitral valve annuloplasty and tricuspid valve annuloplasty with a Cox-Maze procedure.  No chest pain, no increase in his chronic SOB.

## 2013-07-30 NOTE — Progress Notes (Signed)
07/30/2013   PCP: Jerlyn Ly, MD   Chief Complaint  Patient presents with  . Follow-up    S/P Pacemaker changeout    Primary Cardiologist:Dr. P. Martinique  HPI:   80 year old white male is here today for followup of his coronary artery disease.  Please see below for past medical history. Recent Medtronic pacemaker device changeout with complication of hematoma at site.  He was off Coumadin for 2 weeks has now resumed.  Has mild residual hematoma at site. He does have another device clinic appointment in 6 days.  Patient has no chest pain no change in his chronic shortness of breath. He had a nuclear stress test done in May of this year prior to his home pacemaker.  EF was 51% normal wall motion low-risk nuclear study no reversible ischemia. His thyroid and lipid panel is followed by his primary care physician.  Other history includes carotid artery disease and he is due for carotid ultrasound in the near future.   Allergies  Allergen Reactions  . Amoxicillin     Severe thrush with crusting and ulceration of back of throat.  . Codeine Nausea Only    Current Outpatient Prescriptions  Medication Sig Dispense Refill  . allopurinol (ZYLOPRIM) 100 MG tablet Take 50 mg by mouth daily.       Marland Kitchen amLODipine (NORVASC) 5 MG tablet TAKE 1 TABLET BY MOUTH DAILY.  90 tablet  3  . aspirin 81 MG tablet Take 81 mg by mouth daily.        Marland Kitchen atenolol (TENORMIN) 100 MG tablet Take 100 mg by mouth daily.      . benazepril (LOTENSIN) 20 MG tablet Take 20 mg by mouth daily.      . Cholecalciferol (VITAMIN D PO) Take 1 tablet by mouth daily.      . Cyanocobalamin (VITAMIN B 12 PO) Take 1 tablet by mouth daily.       . febuxostat (ULORIC) 40 MG tablet Take 40 mg by mouth daily.       . furosemide (LASIX) 20 MG tablet Take 1 tablet by mouth Daily.      . lansoprazole (PREVACID) 30 MG capsule Take 30 mg by mouth daily.        Marland Kitchen levothyroxine (SYNTHROID, LEVOTHROID) 100 MCG tablet Take 100  mcg by mouth daily.        . potassium chloride SA (K-DUR,KLOR-CON) 20 MEQ tablet Take 20 mEq by mouth every Monday, Wednesday, and Friday.       . tamsulosin (FLOMAX) 0.4 MG CAPS Take 0.4 mg by mouth daily.      Marland Kitchen warfarin (COUMADIN) 5 MG tablet Take 5 mg by mouth daily. Take 5 mg daily except take 2.5 mg on mondays       No current facility-administered medications for this visit.    Past Medical History  Diagnosis Date  . Sleep apnea   . Atrial fibrillation   . SSS (sick sinus syndrome)   . Coronary artery disease   . Hypertension   . Hyperlipidemia   . Pulmonary hypertension   . History of interstitial lung disease   . Diverticulosis   . Esophageal dilatation   . Hypothyroidism   . Gout   . Myocardial infarction   . Pacemaker     Medtronic  . SOB (shortness of breath) on exertion     sees dr. Lamonte Sakai occassionally  . GERD (gastroesophageal reflux disease)   . Arthritis  neck  . Cancer 08/17/12 bx    neck,left  . Allergy     codeine    Past Surgical History  Procedure Laterality Date  . Cardiac catheterization  08/08/2006    EF 50%  . Cardiac catheterization  07/26/2003    EF 40%  . Insert / replace / remove pacemaker      IMPLANT  . Coronary artery bypass graft  08/06/2003    3Vessel LIMA GRAFT TO THE LAD, SAPHENOUS VEIN GRAFT TO THE CIRCUMFLEX, SAPHENOUS VEIN GRAFT TO THE RIGHT POSTERIOR LATERAL BRANCH  . Mitral and tricuspid valve annuloplasty  08/06/2003    Dr. Roxy Manns  . Polypectomy    . Cataract extraction    . Cox-maze microwave ablation    . US echocardiography  07/12/2006    EF 50-55%  . US echocardiography  12/16/2003    EF 55-60%  . Cardiovascular stress test  07/11/2006    EF 50%  . Mass excision N/A 04/20/2012    Procedure: EXCISION OF LOWER LIP CANCER AND EXCISION SUBMENTAL LYMPH NODES;  Surgeon: Rozetta Nunnery, MD;  Location: [place];  Service: ENT;  Laterality: N/A;  . Radical neck dissection Left 08/17/2012    Procedure: LEFT NECK DISSECTION;   Surgeon: Rozetta Nunnery, MD;  Location: [place];  Service: ENT;  Laterality: Left;  . Cardiovascular stress test  06/17/2013    low risk scan, EF 51%    ERD:EYCXKGY:JE colds or fevers, no weight changes Skin:no rashes or ulcers, all told skin cancers additionally has scars from radiation for squamous cell treatment HEENT:no blurred vision, no congestion CV:see HPI PUL:see HPI GI:no diarrhea constipation or melena, no indigestion GU:no hematuria, no dysuria MS:no joint pain, no claudication Neuro:no syncope, no lightheadedness Endo:no diabetes, + thyroid disease  Wt Readings from Last 3 Encounters:  07/30/13 162 lb (73.483 kg)  06/13/13 163 lb (73.936 kg)  06/13/13 163 lb (73.936 kg)    PHYSICAL EXAM BP 140/66  Pulse 80  Ht 5\' 11"  (1.803 m)  Wt 162 lb (73.483 kg)  BMI 22.60 kg/m2 General:Pleasant affect, NAD Skin:Warm and dry, brisk capillary refill HEENT:normocephalic, sclera clear, mucus membranes moist Neck:supple, no JVD, no bruits  Heart: RRR with soft 1/6 systolic murmur, [place], rub or click Lungs:clear without rales, rhonchi, or wheezes HUD:JSHF, non tender, + BS, do not palpate liver spleen or masses Ext:no lower ext edema, 2+ pedal pulses, 2+ radial pulses Neuro:alert and oriented, MAE, follows commands, + facial symmetry EKG: a fib, with VVI pacing, no change in EKG  ASSESSMENT AND PLAN CORONARY ARTERY DISEASE Status post CABG in July of 2005. This included an LIMA graft to the LAD, saphenous vein graft to the circumflex, saphenous vein graft to the right posterior lateral branch. He also had mitral valve annuloplasty and tricuspid valve annuloplasty with a Cox-Maze procedure.  No chest pain, no increase in his chronic SOB.   HYPERLIPIDEMIA Followed by PCP, will call for labs to review   HYPERTENSION controlled  Carotid stenosis Last checked 2013 with rt 39% stenosis, Lt 40-59% stenosis  ATRIAL FIBRILLATION Permanent a fib on coumadin followed by  PCP   PACEMAKER-Medtronic Recent ERI with gen change and hematoma at site, requiring holding coumadin.  He has just resumed coumadin.  Small hematoma at pacer site, pt states much improved no fevers.    Patient had an appointment with Dr. Martinique in July but we will cancel that and have him see Dr. Martinique in 6 months

## 2013-07-30 NOTE — Patient Instructions (Signed)
1.Your physician recommends that you schedule a follow-up appointment in: 6 months with Dr. Martinique and you will not need to see Dr. Martinique in July  2.Continue your same medications  3. Call if you develop any problems

## 2013-07-30 NOTE — Assessment & Plan Note (Signed)
Last checked 2013 with rt 39% stenosis, Lt 40-59% stenosis

## 2013-07-30 NOTE — Assessment & Plan Note (Signed)
controlled 

## 2013-07-30 NOTE — Assessment & Plan Note (Addendum)
Followed by PCP, will call for labs to review --

## 2013-07-30 NOTE — Assessment & Plan Note (Signed)
Recent ERI with gen change and hematoma at site, requiring holding coumadin.  He has just resumed coumadin.  Small hematoma at pacer site, pt states much improved no fevers.

## 2013-08-07 ENCOUNTER — Ambulatory Visit (INDEPENDENT_AMBULATORY_CARE_PROVIDER_SITE_OTHER): Payer: Medicare Other | Admitting: *Deleted

## 2013-08-07 DIAGNOSIS — Z950 Presence of cardiac pacemaker: Secondary | ICD-10-CM

## 2013-08-07 LAB — MDC_IDC_ENUM_SESS_TYPE_INCLINIC
Battery Remaining Longevity: 156 mo
Battery Voltage: 2.79 V
Brady Statistic RV Percent Paced: 98 %
Lead Channel Impedance Value: 742 Ohm
Lead Channel Sensing Intrinsic Amplitude: 5.6 mV
Lead Channel Setting Pacing Amplitude: 2.5 V
Lead Channel Setting Pacing Pulse Width: 0.4 ms
MDC IDC MSMT BATTERY IMPEDANCE: 100 Ohm
MDC IDC MSMT LEADCHNL RA IMPEDANCE VALUE: 67 Ohm
MDC IDC MSMT LEADCHNL RV PACING THRESHOLD AMPLITUDE: 0.5 V
MDC IDC MSMT LEADCHNL RV PACING THRESHOLD PULSEWIDTH: 0.4 ms
MDC IDC SESS DTM: 20150708171906
MDC IDC SET LEADCHNL RV SENSING SENSITIVITY: 2.8 mV

## 2013-08-07 NOTE — Progress Notes (Signed)
Hematoma re-check. Pocket swelling significantly dissipated. No oozing or bleeding. Pt c/o of afternoon fatigue.   Normal device function. Threshold, sensing, impedance consistent with previous measurements. Device programmed to maximize longevity. No high ventricular rates noted. Device programmed at appropriate safety margins. Histogram distribution appropriate for patient activity level. Device programmed to optimize intrinsic conduction. Estimated longevity 55yrs. [facility] 09/23/13 & ROV w/ Dr. Caryl Comes 6/16.

## 2013-08-19 ENCOUNTER — Ambulatory Visit (INDEPENDENT_AMBULATORY_CARE_PROVIDER_SITE_OTHER): Payer: Medicare Other | Admitting: Neurology

## 2013-08-19 ENCOUNTER — Encounter: Payer: Self-pay | Admitting: Neurology

## 2013-08-19 ENCOUNTER — Telehealth: Payer: Self-pay | Admitting: Neurology

## 2013-08-19 VITALS — BP 127/61 | HR 74 | Ht 71.0 in | Wt 163.0 lb

## 2013-08-19 DIAGNOSIS — Z8679 Personal history of other diseases of the circulatory system: Secondary | ICD-10-CM

## 2013-08-19 DIAGNOSIS — I252 Old myocardial infarction: Secondary | ICD-10-CM

## 2013-08-19 DIAGNOSIS — Z950 Presence of cardiac pacemaker: Secondary | ICD-10-CM

## 2013-08-19 DIAGNOSIS — G4733 Obstructive sleep apnea (adult) (pediatric): Secondary | ICD-10-CM

## 2013-08-19 NOTE — Progress Notes (Signed)
Subjective:    Patient ID: [name] [name] is a 80 y.o. male.  HPI    [name] Age, MD, PhD [facility] [address], [address] [place], [place] [address]  Dear Dr. Joylene [name],   I saw your patient, [name] [name], upon your kind request in my neurologic clinic today for initial consultation of a sleep disturbance, in particular his prior diagnosis of obstructive sleep apnea. The patient is unaccompanied today. As you know, [name] [name] is a very friendly 80 year old right-handed gentleman with a complex medical history of sick sinus syndrome, status post pacemaker placement, A. fib, coronary artery disease with MI, hyperlipidemia, hypertension, pulmonary hypertension, interstitial lung disease, diverticulosis, hypothyroidism, reflux disease, and cancer, who was previously diagnosed with obstructive sleep apnea in 2009 or so but never had a CPAP study or CPAP treatment. I do not have a prior sleep report for review. He is very resistant to being retested for sleep apnea. His main complaint is daytime tiredness and this lately has improved some. 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 He has lost weight since his cancer treatment. He does not feel like eating. He goes to bed around 8 PM and he falls asleep okay. He wakes up at 6:30 AM and feels adequately rested. He has an ESS of 5/24. He denies AM HAs and RLS sx and he is not known to kick in his sleep. He has not fallen asleep driving. He takes a nap in the after noon for about 20 minutes. 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   He consumes 2 to 3 caffeinated beverages per day, usually in the form of coffee in the mornings.   His bedroom is usually dark and cool. There is a TV in the bedroom and usually it is not on at night.  He drinks about 2 glasses of wine per day. He quit smoking over 40 years ago.   His Past Medical History Is  Significant For: Past Medical History  Diagnosis Date  . Sleep apnea   . Atrial fibrillation   . SSS (sick sinus syndrome)   . Coronary artery disease   . Hypertension   . Hyperlipidemia   . Pulmonary hypertension   . History of interstitial lung disease   . Diverticulosis   . Esophageal dilatation   . Hypothyroidism   . Gout   . Myocardial infarction   . Pacemaker     Medtronic  . SOB (shortness of breath) on exertion     sees [name] [name] occassionally  . GERD (gastroesophageal reflux disease)   . Arthritis     neck  . Cancer 08/17/12 bx    neck,left  . Allergy     codeine    His Past Surgical History Is Significant For: Past Surgical History  Procedure Laterality Date  . Cardiac catheterization  08/08/2006    EF 50%  . Cardiac catheterization  07/26/2003    EF 40%  . Insert / replace / remove pacemaker      IMPLANT  . Coronary artery bypass graft  08/06/2003    3Vessel LIMA GRAFT TO THE LAD, SAPHENOUS VEIN GRAFT TO THE CIRCUMFLEX, SAPHENOUS VEIN GRAFT TO THE RIGHT POSTERIOR LATERAL BRANCH  . Mitral and tricuspid valve annuloplasty  08/06/2003    Dr. Roxy [name]  . Polypectomy    . Cataract extraction    . Cox-maze microwave  ablation    . US echocardiography  07/12/2006    EF 50-55%  . US echocardiography  12/16/2003    EF 55-60%  . Cardiovascular stress test  07/11/2006    EF 50%  . Mass excision N/A 04/20/2012    Procedure: EXCISION OF LOWER LIP CANCER AND EXCISION SUBMENTAL LYMPH NODES;  Surgeon: [name] Nunnery, MD;  Location: [place] [facility];  Service: ENT;  Laterality: N/A;  . Radical neck dissection Left 08/17/2012    Procedure: LEFT NECK DISSECTION;  Surgeon: [name] Nunnery, MD;  Location: [place];  Service: ENT;  Laterality: Left;  . Cardiovascular stress test  06/17/2013    low risk scan, EF 51%    His Family History Is Significant For: Family History  Problem Relation [name] of Onset  . Hypertension Mother   . Hyperlipidemia Mother   . Stroke Father   . Coronary  artery disease Brother   . Hypertension Brother   . Hyperlipidemia Brother     His Social History Is Significant For: History   Social History  . Marital Status: Married    Spouse Name: N/A    Number of Children: 3  . Years of Education: N/A   Occupational History  .     Social History Main Topics  . Smoking status: Former Smoker -- 1.00 packs/day for 15 years    Types: Cigarettes    Quit date: 09/20/1980  . Smokeless tobacco: Never Used  . Alcohol Use: 1.2 oz/week    2 Glasses of wine per week     Comment: 2 glasses of wine daily  . Drug Use: No  . Sexual Activity: None   Other Topics Concern  . None   Social History Narrative  . None    His Allergies Are:  Allergies  Allergen Reactions  . Amoxicillin     Severe thrush with crusting and ulceration of back of throat.  . Codeine Nausea Only  :   His Current Medications Are:  Outpatient Encounter Prescriptions as of 08/19/2013  Medication Sig  . allopurinol (ZYLOPRIM) 100 MG tablet Take 50 mg by mouth daily.   Marland Kitchen amLODipine (NORVASC) 5 MG tablet TAKE 1 TABLET BY MOUTH DAILY.  Marland Kitchen aspirin 81 MG tablet Take 81 mg by mouth daily.    Marland Kitchen atenolol (TENORMIN) 100 MG tablet Take 100 mg by mouth daily.  . benazepril (LOTENSIN) 20 MG tablet Take 20 mg by mouth daily.  . Cholecalciferol (VITAMIN D PO) Take 1 tablet by mouth daily.  . Cyanocobalamin (VITAMIN B 12 PO) Take 1 tablet by mouth daily.   . febuxostat (ULORIC) 40 MG tablet Take 40 mg by mouth daily.   . fluorouracil (EFUDEX) 5 % cream Apply 1 application topically as needed.  . furosemide (LASIX) 20 MG tablet Take 1 tablet by mouth Daily.  . lansoprazole (PREVACID) 30 MG capsule Take 30 mg by mouth daily.    Marland Kitchen levothyroxine (SYNTHROID, LEVOTHROID) 100 MCG tablet Take 100 mcg by mouth daily.    . potassium chloride SA (K-DUR,KLOR-CON) 20 MEQ tablet Take 20 mEq by mouth every Monday, Wednesday, and Friday.   . tamsulosin (FLOMAX) 0.4 MG CAPS Take 0.4 mg by mouth daily.   Marland Kitchen warfarin (COUMADIN) 5 MG tablet Take 5 mg by mouth daily. Take 5 mg daily except take 2.5 mg on mondays  :  Review of Systems:  Out of a complete 14 point review of systems, all are reviewed and negative with the exception of these symptoms as  listed below:   Review of Systems  Constitutional: Positive for fatigue.  HENT: Negative.   Eyes: Negative.   Respiratory: Positive for shortness of breath.   Cardiovascular: Negative.   Gastrointestinal: Negative.   Endocrine: Positive for cold intolerance.  Genitourinary: Negative.   Musculoskeletal: Negative.   Skin: Negative.   Allergic/Immunologic: Negative.   Neurological: Negative.   Hematological: Negative.   Psychiatric/Behavioral: Positive for sleep disturbance (snoring).    Objective:  Neurologic Exam  Physical Exam Physical Examination:   Filed Vitals:   08/19/13 0914  BP: 127/61  Pulse: 74    General Examination: The patient is a very pleasant 80 y.o. male in no acute distress. He appears well-developed and well-nourished and well groomed.   HEENT: Normocephalic, atraumatic, pupils are equal, round and reactive to light and accommodation. Funduscopic exam is normal with sharp disc margins noted. Extraocular tracking is good without limitation to gaze excursion or nystagmus noted. Normal smooth pursuit is noted. Hearing is impaired bilaterally. Tympanic membrane is clear on the right and partially obscured with cerumen on the left. Face is symmetric with normal facial animation and normal facial sensation. Speech is clear with no dysarthria noted. There is no hypophonia. There is no lip, neck/head, jaw or voice tremor. Neck is supple with full range of passive and active motion. There are no carotid bruits on auscultation. Oropharynx exam reveals: severe mouth dryness, adequate dental hygiene and mild airway crowding, due to narrow airway entry, tonsils in place and larger uvula. Mallampati is class II. Tongue protrudes  centrally and palate elevates symmetrically. Tonsils are 1+ in size. Neck size is 15.75 inches. He has a Mild overbite. Nasal inspection reveals no significant nasal mucosal bogginess or redness and no septal deviation.   Chest:  There are bilateral end inspiratory crackles throughout. No wheezing is noted.  Heart: S1+S2+0, regular and normal without murmurs, rubs or gallops noted.   Abdomen: Soft, non-tender and non-distended with normal bowel sounds appreciated on auscultation.  Extremities: There is no pitting edema in the distal lower extremities bilaterally. Pedal pulses are intact.  Skin: Warm and dry without trophic changes noted. There are no varicose veins. Skin changes are seen on the arms and legs, from chronic sun exposure.   Musculoskeletal: exam reveals no obvious joint deformities, tenderness or joint swelling or erythema.   Neurologically:  Mental status: The patient is awake, alert and oriented in all 4 spheres. His immediate and remote memory, attention, language skills and fund of knowledge are appropriate. There is no evidence of aphasia, agnosia, apraxia or anomia. Speech is clear with normal prosody and enunciation. Thought process is linear. Mood is normal and affect is normal.  Cranial nerves II - XII are as described above under HEENT exam. In addition: shoulder shrug is normal with equal shoulder height noted. Motor exam: Normal bulk, strength and tone is noted. There is no drift, tremor or rebound. Romberg is negative. Reflexes are 2+ throughout. Babinski: Toes are flexor bilaterally. Fine motor skills and coordination: intact with normal finger taps, normal hand movements, normal rapid alternating patting, normal foot taps and normal foot agility.  Cerebellar testing: No dysmetria or intention tremor on finger to nose testing. Heel to shin is unremarkable bilaterally. There is no truncal or gait ataxia.  Sensory exam: intact to light touch, pinprick, vibration,  temperature sense in the upper and lower extremities.  Gait, station and balance: He stands easily. No veering to one side is noted. No leaning to one side is  noted. Posture is [name]-appropriate and stance is narrow based. Gait shows normal stride length and normal pace. No problems turning are noted. He turns en bloc. Tandem walk is difficult for him.                Assessment and Plan:   In summary, [name] [name] is a very pleasant 80 y.o.-year old male with a history and physical exam concerning for obstructive sleep apnea (OSA). He is very reluctant to come back for retesting. Given his medical history of heart disease, lung disease, prior diagnosis of OSA, atrial fibrillation, I feel strongly that he should be retested and treated if possible. Since he has lately felt a little better with his daytime tiredness he feels more reluctant to come back. I tried to explain to him the importance of treating all his risk factors for cardiovascular disease. He seems to understand well but would like to call back to schedule his sleep study after he's had a chance to think about it. I gave him a direct phone line to our sleep lab so we can schedule at his next convenience. I have encouraged him to call back as soon as he can. I had a long chat with the patient about my findings and the diagnosis of OSA, its prognosis and treatment options. We talked about medical treatments, surgical interventions and non-pharmacological approaches. I explained in particular the risks and ramifications of untreated moderate to severe OSA, especially with respect to developing cardiovascular disease down the Road, including congestive heart failure, difficult to treat hypertension, cardiac arrhythmias, or stroke. Even type 2 diabetes has, in part, been linked to untreated OSA. Symptoms of untreated OSA include daytime sleepiness, memory problems, mood irritability and mood disorder such as depression and anxiety, lack of energy, as  well as recurrent headaches, especially morning headaches. We talked about trying to maintain a healthy lifestyle in general, as well as the importance of weight control. I encouraged the patient to eat healthy, exercise daily and keep well hydrated, to keep a scheduled bedtime and wake time routine, to not skip any meals and eat healthy snacks in between meals. I advised the patient not to drive when feeling sleepy. I recommended the following at this time: sleep study with potential positive airway pressure titration.  I explained the sleep test procedure to the patient and also outlined possible surgical and non-surgical treatment options of OSA, including the use of a custom-made dental device (which would require a referral to a specialist dentist or oral surgeon), upper airway surgical options, such as pillar implants, radiofrequency surgery, tongue base surgery, and UPPP (which would involve a referral to an ENT surgeon). Rarely, jaw surgery such as mandibular advancement may be considered.  I also explained the CPAP treatment option to the patient. I explained the importance of being compliant with PAP treatment, not only for insurance purposes but primarily to improve His symptoms, and for the patient's long term health benefit, including to reduce His cardiovascular risks. I answered all him questions today and the patient indicated that he would like to call back to schedule his sleep study. I will see him back afterwards. Thank you very much for allowing me to participate in the care of this nice patient. If I can be of any further assistance to you please do not hesitate to call me at [phone].  Sincerely,   [name] Age, MD, PhD

## 2013-08-19 NOTE — Telephone Encounter (Signed)
Brought patient to tour the sleep lab and to schedule sleep study appointment in which he says that he refuses to do a sleep study in the lab.  He will do a home sleep test only.  Says that his wife is on 24hr oxygen and he wants to be home.  Also says that he does not like having the sensors and wires on him, he would not be able to sleep.  Did try to encourage the patient that our technicians were gentle and perhaps this would be an improvement from his previous experience.  He still refused.  Advised pt that I would send notification to Dr. Rexene Alberts.

## 2013-08-19 NOTE — Patient Instructions (Addendum)
Based on your symptoms and your exam I believe you are at risk for obstructive sleep apnea or OSA, and I think we should proceed with a sleep study to determine whether you do or do not have OSA and how severe it is. If you have more than mild OSA, I want you to consider treatment with CPAP. Please remember, the risks and ramifications of moderate to severe obstructive sleep apnea or OSA are: Cardiovascular disease, including congestive heart failure, stroke, difficult to control hypertension, arrhythmias, and even type 2 diabetes has been linked to untreated OSA. Sleep apnea causes disruption of sleep and sleep deprivation in most cases, which, in turn, can cause recurrent headaches, problems with memory, mood, concentration, focus, and vigilance. Most people with untreated sleep apnea report excessive daytime sleepiness, which can affect their ability to drive. Please do not drive if you feel sleepy.  I will see you back after your sleep study to go over the test results and where to go from there. We will call you after your sleep study and to set up an appointment at the time.   Please call Kissa in the sleep lab to schedule your sleep study.

## 2013-08-28 ENCOUNTER — Ambulatory Visit: Payer: Self-pay | Admitting: Cardiology

## 2013-08-28 ENCOUNTER — Ambulatory Visit (INDEPENDENT_AMBULATORY_CARE_PROVIDER_SITE_OTHER): Payer: Medicare Other | Admitting: Neurology

## 2013-08-28 DIAGNOSIS — G4733 Obstructive sleep apnea (adult) (pediatric): Secondary | ICD-10-CM

## 2013-08-28 NOTE — Sleep Study (Signed)
Patient arrives for HST instruction.  Patient is given written instructions, picture instructions, and a demonstration on how to use HST unit.  All questions / concerns were addressed by technologist.  Financial responsibility was explained.  Follow up information was given to patient regarding how results would be received.

## 2013-09-06 ENCOUNTER — Encounter: Payer: Self-pay | Admitting: Internal Medicine

## 2013-09-11 ENCOUNTER — Telehealth: Payer: Self-pay | Admitting: Neurology

## 2013-09-11 DIAGNOSIS — I252 Old myocardial infarction: Secondary | ICD-10-CM

## 2013-09-11 DIAGNOSIS — I482 Chronic atrial fibrillation, unspecified: Secondary | ICD-10-CM

## 2013-09-11 DIAGNOSIS — G4733 Obstructive sleep apnea (adult) (pediatric): Secondary | ICD-10-CM

## 2013-09-11 NOTE — Telephone Encounter (Signed)
Pls call Pt: his home sleep test indicates overall mild obstructive sleep disordered breathing, based on the number respiratory events. However, he had significant desaturations and a nadir of 82% and significant time below 88% saturation indicating nocturnal hypoxemia in the context of obstructive sleep disordered breathing. Given his significant medical history including heart disease with A. fib and history of coronary artery disease status post MI, treatment of his OSA with CPAP is recommended. For this, he will be advised to consider returning for a full night CPAP titration study for optimal mask fitting and titration. I will order cpap study.

## 2013-09-18 ENCOUNTER — Encounter: Payer: Self-pay | Admitting: *Deleted

## 2013-09-18 NOTE — Telephone Encounter (Signed)
I called and left a message for the patient about his home sleep test. I informed the patient the test indicated overall mild obstructive sleep disordered breathing, based on the number of  Respiratory events, also he had significant desaturation and a nadir of 82% and significant time below 88% saturation indicating nocturnal hypoxemia. Dr. Rexene Alberts recommend CPAP therapy due to a history of heart disease with A. Fib and history of coronary artery disease status post MI. I informed the patient that this will require an overnight study in lab for the proper titration and mask fitting. I will fax a copy of the report to Dr. Silvestre Mesi office and I will mail a copy to the patient.

## 2013-09-23 ENCOUNTER — Ambulatory Visit (INDEPENDENT_AMBULATORY_CARE_PROVIDER_SITE_OTHER): Payer: Medicare Other | Admitting: *Deleted

## 2013-09-23 DIAGNOSIS — I4891 Unspecified atrial fibrillation: Secondary | ICD-10-CM

## 2013-09-23 DIAGNOSIS — I442 Atrioventricular block, complete: Secondary | ICD-10-CM

## 2013-09-23 DIAGNOSIS — I482 Chronic atrial fibrillation, unspecified: Secondary | ICD-10-CM

## 2013-09-23 NOTE — Progress Notes (Signed)
Remote pacemaker transmission.   

## 2013-09-25 ENCOUNTER — Ambulatory Visit (INDEPENDENT_AMBULATORY_CARE_PROVIDER_SITE_OTHER): Payer: Medicare Other | Admitting: Neurology

## 2013-09-25 DIAGNOSIS — G4733 Obstructive sleep apnea (adult) (pediatric): Secondary | ICD-10-CM

## 2013-09-25 DIAGNOSIS — R0902 Hypoxemia: Secondary | ICD-10-CM

## 2013-09-25 DIAGNOSIS — I482 Chronic atrial fibrillation, unspecified: Secondary | ICD-10-CM

## 2013-09-25 DIAGNOSIS — I252 Old myocardial infarction: Secondary | ICD-10-CM

## 2013-09-26 LAB — MDC_IDC_ENUM_SESS_TYPE_REMOTE
Battery Remaining Longevity: 157 mo
Battery Voltage: 2.78 V
Brady Statistic RV Percent Paced: 99 %
Lead Channel Pacing Threshold Amplitude: 0.625 V
Lead Channel Pacing Threshold Pulse Width: 0.4 ms
Lead Channel Setting Pacing Amplitude: 2.5 V
Lead Channel Setting Pacing Pulse Width: 0.4 ms
Lead Channel Setting Sensing Sensitivity: 2.8 mV
MDC IDC MSMT BATTERY IMPEDANCE: 100 Ohm
MDC IDC MSMT LEADCHNL RA IMPEDANCE VALUE: 67 Ohm
MDC IDC MSMT LEADCHNL RV IMPEDANCE VALUE: 770 Ohm
MDC IDC SESS DTM: 20150824110058

## 2013-10-01 ENCOUNTER — Encounter: Payer: Self-pay | Admitting: Cardiology

## 2013-10-09 ENCOUNTER — Telehealth: Payer: Self-pay | Admitting: Neurology

## 2013-10-09 DIAGNOSIS — G4734 Idiopathic sleep related nonobstructive alveolar hypoventilation: Secondary | ICD-10-CM

## 2013-10-09 DIAGNOSIS — J849 Interstitial pulmonary disease, unspecified: Secondary | ICD-10-CM

## 2013-10-09 DIAGNOSIS — I482 Chronic atrial fibrillation, unspecified: Secondary | ICD-10-CM

## 2013-10-09 DIAGNOSIS — G4733 Obstructive sleep apnea (adult) (pediatric): Secondary | ICD-10-CM

## 2013-10-09 DIAGNOSIS — Z950 Presence of cardiac pacemaker: Secondary | ICD-10-CM

## 2013-10-09 NOTE — Telephone Encounter (Signed)
David: complex history:   Please call and inform patient that I have entered an order for treatment with PAP. He did fairly well during the latest sleep study with CPAP, but I would like to have him see his lung doctor as well, if he has one as his oxyen saturation was still not great and he may need supplemental oxygen at night. I Would like to get him started on CPAP therapy at home and placed an order. We will, therefore, arrange for a machine for home use through a DME (durable medical equipment) company of His choice; and I will see the patient back in follow-up in about 6 weeks. Please also explain to the patient that I will be looking out for compliance data downloaded from the machine, which can be done remotely through a modem at times or stored on an SD card in the back of the machine. At the time of the followup appointment we will discuss sleep study results and how it is going with PAP treatment at home. Please advise patient to bring [employer] machine at the time of the visit; at least for the first visit, even though this is cumbersome. Bringing the machine for every visit after that may not be needed, but often helps for the first visit. Please also make sure, the patient has a follow-up appointment with me in about 6 weeks from the setup date, thanks.    Kissa: pls call for appt.   [name], MD, PhD [facility])

## 2013-10-10 ENCOUNTER — Encounter: Payer: Self-pay | Admitting: Internal Medicine

## 2013-10-14 NOTE — Telephone Encounter (Signed)
Pt was contacted and informed of his CPAP titration results.  Patient was instructed to consult his lung doctor as advised by Dr. Rexene Alberts.  Pt was informed that a copy of his test results would be mailed to him and a copy would be sent to Dr. Crist Infante.  Pt informed that his information would be referred to a DME company for him to obtain his own CPAP equipment at home.

## 2013-10-15 ENCOUNTER — Encounter: Payer: Self-pay | Admitting: Neurology

## 2013-11-22 ENCOUNTER — Encounter: Payer: Self-pay | Admitting: Neurology

## 2013-11-27 NOTE — Progress Notes (Signed)
Quick Note:  I reviewed the patient's CPAP compliance data from 10/23/2013 to 11/21/2013, which is a total of 30 days, during which time the patient used CPAP every day. The average usage for all days was 6 hours and 29 minutes. The percent used days greater than 4 hours was 80 %, indicating very good compliance. The residual AHI was 3.7 per hour, indicating an appropriate treatment pressure of 9 cwp with EPR of 3. Air leak from the mask was at times quite high. I will review this data with the patient at the next office visit, which is currently routinely scheduled for 12/10/2013.  [name], MD, PhD [facility]   ______

## 2013-12-06 ENCOUNTER — Telehealth: Payer: Self-pay | Admitting: *Deleted

## 2013-12-06 NOTE — Telephone Encounter (Signed)
Left message regarding apt date and time change due to schedule changes on for Dr. Rexene Alberts

## 2013-12-10 ENCOUNTER — Ambulatory Visit: Payer: Medicare Other | Admitting: Neurology

## 2013-12-13 ENCOUNTER — Encounter: Payer: Self-pay | Admitting: Neurology

## 2013-12-16 ENCOUNTER — Encounter: Payer: Self-pay | Admitting: Neurology

## 2013-12-16 ENCOUNTER — Ambulatory Visit (INDEPENDENT_AMBULATORY_CARE_PROVIDER_SITE_OTHER): Payer: Medicare Other | Admitting: Neurology

## 2013-12-16 VITALS — BP 116/66 | HR 86 | Temp 97.2°F | Ht 68.0 in | Wt 166.0 lb

## 2013-12-16 DIAGNOSIS — G4733 Obstructive sleep apnea (adult) (pediatric): Secondary | ICD-10-CM

## 2013-12-16 DIAGNOSIS — Z9989 Dependence on other enabling machines and devices: Principal | ICD-10-CM

## 2013-12-16 NOTE — Progress Notes (Signed)
Subjective:    Patient ID: [name] [name] is a 80 y.o. male.  HPI     Interim history:   [name] [name] is a very friendly 80 year old right-handed gentleman with a complex medical history of sick sinus syndrome, status post pacemaker placement, A. fib, coronary artery disease with MI, hyperlipidemia, hypertension, pulmonary hypertension, interstitial lung disease, diverticulosis, hypothyroidism, reflux disease, and cancer, who presents for follow-up consultation of his obstructive sleep apnea, after his recent sleep studies. The patient is unaccompanied today. I first met him on 08/19/2013 at the request of his primary care physician, at which time the patient reported a previous diagnosis of obstructive sleep apnea. He was not on treatment in the past. He reported daytime tiredness. I suggested he return for sleep study. He requested a home sleep test. He had this on 08/29/2013 and it showed a total AHI of 13.5 per hour. Oxyhemoglobin desaturation nadir was 82%. Time below 88% saturation was 53 minutes. I advised him to return for a full night CPAP titration study. He had this on 09/25/2013 and went over his test results with him in detail today. Sleep efficiency was 67.9%. Sleep latency was 26 minutes. Wake after sleep onset was 129.5 minutes with moderate sleep fragmentation noted. There was an increased percentage of slow-wave sleep and near normal percentage of REM sleep with a normal REM latency. CPAP was titrated from 5-11 cm. His AHI was reduced to 3.8 per hour at 9 cm pressure. He did have desaturations. Time below 88% saturation was 1 hour and 13 minutes. I prescribed CPAP for him.  I reviewed the patient's CPAP compliance data from 10/23/2013 to 11/21/2013, which is a total of 30 days, during which time the patient used CPAP every day. The average usage for all days was 6 hours and 29 minutes. The percent used days greater than 4 hours was 80 %, indicating very good compliance. The residual  AHI was 3.7 per hour, indicating an appropriate treatment pressure of 9 cwp with EPR of 3. Air leak from the mask was at times quite high.  Today, I reviewed his compliance data from 11/13/2013 through 12/12/2013 which is a total of 30 days during which time he used his machine every night, percent used days greater than 4 hours was 100%, indicating superb compliance. Average usage of 8 hours since 53 minutes. Residual AHI at 2.8, leak at times high with the 95th percentile at 40.0 L/m. Set pressure at 9 cm with EPR of 3.  Today, he reports he did not like FFM and is currently on nasal pillows, size medium, he has a chinstrap, but does not use it. He does endorse, feeling better, less tired than before and his legs feel better, less achy. He still has to get up 3-4 times to urinate each night. He says, he keeps his mouth closed, but the nasal pillows, may leak from around it.   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 He has lost weight since his cancer treatment. He does not feel like eating. He goes to bed around 8 PM and he falls asleep okay. He wakes up at 6:30 AM and feels adequately rested. He has an ESS of 5/24. He denies AM HAs and RLS sx and he is not known to kick in his sleep. He has not fallen asleep driving. He takes a nap in the after noon for about  20 minutes. 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   He consumes 2 to 3 caffeinated beverages per day, usually in the form of coffee in the mornings.    His bedroom is usually dark and cool. There is a TV in the bedroom and usually it is not on at night.   He drinks about 2 glasses of wine per day. He quit smoking over 40 years ago.   His Past Medical History Is Significant For: Past Medical History  Diagnosis Date  . Sleep apnea   . Atrial fibrillation   . SSS (sick sinus syndrome)   . Coronary artery disease   . Hypertension   . Hyperlipidemia   . Pulmonary  hypertension   . History of interstitial lung disease   . Diverticulosis   . Esophageal dilatation   . Hypothyroidism   . Gout   . Myocardial infarction   . Pacemaker     Medtronic  . SOB (shortness of breath) on exertion     sees dr. Lamonte Sakai occassionally  . GERD (gastroesophageal reflux disease)   . Arthritis     neck  . Cancer 08/17/12 bx    neck,left  . Allergy     codeine    His Past Surgical History Is Significant For: Past Surgical History  Procedure Laterality Date  . Cardiac catheterization  08/08/2006    EF 50%  . Cardiac catheterization  07/26/2003    EF 40%  . Insert / replace / remove pacemaker      IMPLANT  . Coronary artery bypass graft  08/06/2003    3Vessel LIMA GRAFT TO THE LAD, SAPHENOUS VEIN GRAFT TO THE CIRCUMFLEX, SAPHENOUS VEIN GRAFT TO THE RIGHT POSTERIOR LATERAL BRANCH  . Mitral and tricuspid valve annuloplasty  08/06/2003    Dr. Roxy Manns  . Polypectomy    . Cataract extraction    . Cox-maze microwave ablation    . US echocardiography  07/12/2006    EF 50-55%  . US echocardiography  12/16/2003    EF 55-60%  . Cardiovascular stress test  07/11/2006    EF 50%  . Mass excision N/A 04/20/2012    Procedure: EXCISION OF LOWER LIP CANCER AND EXCISION SUBMENTAL LYMPH NODES;  Surgeon: Rozetta Nunnery, MD;  Location: [facility];  Service: ENT;  Laterality: N/A;  . Radical neck dissection Left 08/17/2012    Procedure: LEFT NECK DISSECTION;  Surgeon: Rozetta Nunnery, MD;  Location: [facility];  Service: ENT;  Laterality: Left;  . Cardiovascular stress test  06/17/2013    low risk scan, EF 51%    His Family History Is Significant For: Family History  Problem Relation Age of Onset  . Hypertension Mother   . Hyperlipidemia Mother   . Stroke Father   . Coronary artery disease Brother   . Hypertension Brother   . Hyperlipidemia Brother     His Social History Is Significant For: History   Social History  . Marital Status: Married    Spouse Name: Gerald Stabs    Number  of Children: 3  . Years of Education: 14   Occupational History  .     Social History Main Topics  . Smoking status: Former Smoker -- 1.00 packs/day for 15 years    Types: Cigarettes    Quit date: 09/20/1980  . Smokeless tobacco: Never Used  . Alcohol Use: 1.2 oz/week    2 Glasses of wine per week     Comment: 2 glasses of wine daily  .  Drug Use: No  . Sexual Activity: None   Other Topics Concern  . None   Social History Narrative   Patient consumes 2 cups of caffeine daily    His Allergies Are:  Allergies  Allergen Reactions  . Amoxicillin     Severe thrush with crusting and ulceration of back of throat.  . Codeine Nausea Only  . Tramadol Nausea Only  :   His Current Medications Are:  Outpatient Encounter Prescriptions as of 12/16/2013  Medication Sig  . allopurinol (ZYLOPRIM) 100 MG tablet Take 50 mg by mouth daily.   Marland Kitchen amLODipine (NORVASC) 5 MG tablet TAKE 1 TABLET BY MOUTH DAILY.  Marland Kitchen aspirin 81 MG tablet Take 81 mg by mouth daily.    Marland Kitchen atenolol (TENORMIN) 100 MG tablet Take 100 mg by mouth daily.  Marland Kitchen atenolol (TENORMIN) 25 MG tablet   . benazepril (LOTENSIN) 20 MG tablet Take 20 mg by mouth daily.  . cephALEXin (KEFLEX) 500 MG capsule   . febuxostat (ULORIC) 40 MG tablet Take 40 mg by mouth daily.   . furosemide (LASIX) 20 MG tablet Take 1 tablet by mouth Daily.  . lansoprazole (PREVACID) 30 MG capsule Take 30 mg by mouth daily.    Marland Kitchen levothyroxine (SYNTHROID, LEVOTHROID) 100 MCG tablet Take 100 mcg by mouth daily.    . potassium chloride SA (K-DUR,KLOR-CON) 20 MEQ tablet Take 20 mEq by mouth every Monday, Wednesday, and Friday.   . sodium chloride irrigation 0.9 % irrigation   . tamsulosin (FLOMAX) 0.4 MG CAPS Take 0.4 mg by mouth daily.  Marland Kitchen warfarin (COUMADIN) 5 MG tablet Take 5 mg by mouth daily. Take 5 mg daily except take 2.5 mg on mondays  . [DISCONTINUED] Cholecalciferol (VITAMIN D PO) Take 1 tablet by mouth daily.  . [DISCONTINUED] Cyanocobalamin (VITAMIN B  12 PO) Take 1 tablet by mouth daily.   . [DISCONTINUED] fluorouracil (EFUDEX) 5 % cream Apply 1 application topically as needed.  . [DISCONTINUED] traMADol (ULTRAM) 50 MG tablet   :  Review of Systems:  Out of a complete 14 point review of systems, all are reviewed and negative with the exception of these symptoms as listed below:  Review of Systems  All other systems reviewed and are negative.   Objective:  Neurologic Exam  Physical Exam Physical Examination:   Filed Vitals:   12/16/13 1254  BP: 116/66  Pulse: 86  Temp: 97.2 F (36.2 C)    General Examination: The patient is a very pleasant 80 y.o. male in no acute distress. He appears well-developed and well-nourished and well groomed.   HEENT: Normocephalic, atraumatic, pupils are equal, round and reactive to light and accommodation. Funduscopic exam is normal with sharp disc margins noted. Extraocular tracking is good without limitation to gaze excursion or nystagmus noted. Normal smooth pursuit is noted. Hearing is impaired bilaterally. Face is symmetric with normal facial animation and normal facial sensation. Speech is clear with no dysarthria noted. There is no hypophonia. There is no lip, neck/head, jaw or voice tremor. Neck is supple with full range of passive and active motion. There are no carotid bruits on auscultation. Oropharynx exam reveals: severe mouth dryness, adequate dental hygiene and mild airway crowding, due to narrow airway entry, tonsils in place and larger uvula. Mallampati is class II. Tongue protrudes centrally and palate elevates symmetrically. Tonsils are 1+ in size. Neck size is 15.75 inches. He has a Mild overbite. Nasal inspection reveals no significant nasal mucosal bogginess or redness and no septal  deviation. No sores.   Chest:  There are bilateral end inspiratory crackles throughout. No wheezing is noted.  Heart: S1+S2+0, regular and normal without murmurs, rubs or gallops noted.   Abdomen: Soft,  non-tender and non-distended with normal bowel sounds appreciated on auscultation.  Extremities: There is no pitting edema in the distal lower extremities bilaterally. Pedal pulses are intact.  Skin: Warm and dry without trophic changes noted. There are no varicose veins. Skin changes are seen on the arms and legs, from chronic sun exposure. Multiple old bruises on his hands and forearms are noted.   Musculoskeletal: exam reveals no obvious joint deformities, tenderness or joint swelling or erythema. He has some soreness in the L hamstring and calf, but no palpable cord or swelling or redness is noted.   Neurologically:  Mental status: The patient is awake, alert and oriented in all 4 spheres. His immediate and remote memory, attention, language skills and fund of knowledge are appropriate. There is no evidence of aphasia, agnosia, apraxia or anomia. Speech is clear with normal prosody and enunciation. Thought process is linear. Mood is normal and affect is normal.  Cranial nerves II - XII are as described above under HEENT exam. In addition: shoulder shrug is normal with equal shoulder height noted. Motor exam: Normal bulk, strength and tone is noted. There is no drift, tremor or rebound. Romberg is negative. Reflexes are 2+ throughout. Babinski: Toes are flexor bilaterally. Fine motor skills and coordination: intact with normal finger taps, normal hand movements, normal rapid alternating patting, normal foot taps and normal foot agility.  Cerebellar testing: No dysmetria or intention tremor on finger to nose testing. Heel to shin is unremarkable bilaterally. There is no truncal or gait ataxia.  Sensory exam: intact to light touch, pinprick, vibration, temperature sense in the upper and lower extremities.  Gait, station and balance: He stands easily. No veering to one side is noted. No leaning to one side is noted. Posture is age-appropriate and stance is narrow based. Gait shows normal stride length  and normal pace. No problems turning are noted. He turns en bloc. Tandem walk is difficult for him.                Assessment and Plan:   In summary, [name] [name] is a very pleasant 80 year old male with a complex medical history of sick sinus syndrome, status post pacemaker placement, A. fib, coronary artery disease with MI, hyperlipidemia, hypertension, pulmonary hypertension, interstitial lung disease, diverticulosis, hypothyroidism, reflux disease, and cancer, who presents for follow-up consultation of his obstructive sleep apnea, now on treatment with CPAP with full compliance. I talked to him about his home sleep test and his CPAP titration study in detail. I also explained to him his compliance data and congratulated him on his adherence to therapy. He has benefited from treatment in that he feels less tired during the day and somewhat better rested and his legs feel better. His physical exam was stable. He has excess air leak from the nasal pillows mask. He is requested to get in touch with his DME company to get a better mask fit. He did not like to use the full facemask and feels that he does not need the chin strap. His leak he feels comes from the side of his nasal pillows. Also because of significant oxygen drops despite CPAP treatment I would like to monitor his oxygen level overnight with a pulse oximetry test while he is on CPAP therapy. We will arrange  for this through his DME company and I will look at the results and we will call him back with those. I've asked him to continue using CPAP regularly and not to skip any night. We will help him with better mask fit and call him back after the pulse oximetry test results are available. I explained the importance of being compliant with PAP treatment, not only for insurance purposes but primarily to improve His symptoms, and for the patient's long term health benefit, including to reduce His cardiovascular risks. I will see him back in 3-4  months. I answered all him questions today and the patient was in agreement.

## 2013-12-19 ENCOUNTER — Encounter: Payer: Self-pay | Admitting: Neurology

## 2013-12-19 DIAGNOSIS — G4733 Obstructive sleep apnea (adult) (pediatric): Secondary | ICD-10-CM

## 2013-12-19 DIAGNOSIS — G4734 Idiopathic sleep related nonobstructive alveolar hypoventilation: Secondary | ICD-10-CM

## 2013-12-19 DIAGNOSIS — Z9989 Dependence on other enabling machines and devices: Secondary | ICD-10-CM

## 2013-12-23 ENCOUNTER — Encounter: Payer: Self-pay | Admitting: Family

## 2013-12-23 NOTE — Progress Notes (Signed)
Quick Note:  I reviewed this patient's overnight pulse oximetry test from 12/18/2013. The patient used his CPAP machine during the pulse oximetry test as well. Total valid test time was 8 hours and 4 minutes. Average oxygen saturation at 89.9%. Lowest oxygen saturation 82%. Time below 88% saturation was 2 hours and 1 minute. This indicates that despite using CPAP appropriately the patient still has low oxygen saturations while asleep. I would like to place him on oxygen with CPAP therapy. I will order supplemental oxygen at 2 L/m to be used at night along with CPAP therapy. We will repeat his pulse oximetry test down the road while he is on CPAP therapy and supplemental oxygen therapy overnight. [name], MD, PhD [facility] ([facility]  ______

## 2013-12-24 ENCOUNTER — Ambulatory Visit (INDEPENDENT_AMBULATORY_CARE_PROVIDER_SITE_OTHER): Payer: Medicare Other | Admitting: Family

## 2013-12-24 ENCOUNTER — Ambulatory Visit (HOSPITAL_COMMUNITY)
Admission: RE | Admit: 2013-12-24 | Discharge: 2013-12-24 | Disposition: A | Discharge status: Home / Self Care | Payer: Medicare Other | Source: Ambulatory Visit | Attending: Family | Admitting: Family

## 2013-12-24 ENCOUNTER — Encounter: Payer: Self-pay | Admitting: Family

## 2013-12-24 VITALS — BP 109/68 | HR 60 | Resp 16 | Ht 70.5 in | Wt 161.0 lb

## 2013-12-24 DIAGNOSIS — I6523 Occlusion and stenosis of bilateral carotid arteries: Secondary | ICD-10-CM | POA: Insufficient documentation

## 2013-12-24 DIAGNOSIS — Z48812 Encounter for surgical aftercare following surgery on the circulatory system: Secondary | ICD-10-CM

## 2013-12-24 NOTE — Progress Notes (Signed)
Established Carotid Patient   History of Present Illness  [name] [name] is a 80 y.o. male patient of Dr. Evelena Leyden with no history of carotid intervention, he returns today for carotid artery surveillance.  Patient has Negative history of TIA or stroke symptom. The patient denies amaurosis fugax or monocular blindness. The patient denies facial drooping. Pt. denies hemiplegia. The patient denies receptive or expressive aphasia. Pt. denies extremity weakness. Patient denies claudication symptoms, denies non-healing wounds. Patient reports New Medical or Surgical History: lymph node removed on the left side of his neck, was not melanoma. Had 3 vessel CABG in 2005, denies MI.  Pt Diabetic: No Pt smoker: former smoker, quit 50 years ago  Pt meds include: Statin : No: is statin intolerant, caused myalgias  Betablocker: Yes ASA: Yes Other anticoagulants/antiplatelets: coumadin, for atrial fib.   Past Medical History  Diagnosis Date  . Sleep apnea   . Atrial fibrillation   . SSS (sick sinus syndrome)   . Coronary artery disease   . Hypertension   . Hyperlipidemia   . Pulmonary hypertension   . History of interstitial lung disease   . Diverticulosis   . Esophageal dilatation   . Hypothyroidism   . Gout   . Myocardial infarction   . Pacemaker     Medtronic  . SOB (shortness of breath) on exertion     sees dr. Lamonte Sakai occassionally  . GERD (gastroesophageal reflux disease)   . Arthritis     neck  . Allergy     codeine  . CHF (congestive heart failure)   . 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 Carotid artery occlusion     Social History History  Substance Use Topics  . Smoking status: Former Smoker -- 1.00 packs/day for 15 years    Types: Cigarettes    Quit date: 09/20/1980  . Smokeless tobacco: Never Used  . Alcohol Use: 1.2 oz/week    2 Glasses of wine per week     Comment: 2 glasses of wine daily    Family History Family History  Problem  Relation Age of Onset  . Hypertension Mother   . Hyperlipidemia Mother   . Stroke Father   . Coronary artery disease Brother   . Hypertension Brother   . Hyperlipidemia Brother     Surgical History Past Surgical History  Procedure Laterality Date  . Cardiac catheterization  08/08/2006    EF 50%  . Cardiac catheterization  07/26/2003    EF 40%  . Insert / replace / remove pacemaker      IMPLANT  . Coronary artery bypass graft  08/06/2003    3Vessel LIMA GRAFT TO THE LAD, SAPHENOUS VEIN GRAFT TO THE CIRCUMFLEX, SAPHENOUS VEIN GRAFT TO THE RIGHT POSTERIOR LATERAL BRANCH  . Mitral and tricuspid valve annuloplasty  08/06/2003    Dr. Roxy Manns  . Polypectomy    . Cataract extraction    . Cox-maze microwave ablation    . US echocardiography  07/12/2006    EF 50-55%  . US echocardiography  12/16/2003    EF 55-60%  . Cardiovascular stress test  07/11/2006    EF 50%  . Mass excision N/A 04/20/2012    Procedure: EXCISION OF LOWER LIP CANCER AND EXCISION SUBMENTAL LYMPH NODES;  Surgeon: Rozetta Nunnery, MD;  Location: [facility];  Service: ENT;  Laterality: N/A;  . Radical neck dissection Left 08/17/2012    Procedure: LEFT NECK DISSECTION;  Surgeon: Rozetta Nunnery, MD;  Location: [facility];  Service: ENT;  Laterality: Left;  . Cardiovascular stress test  06/17/2013    low risk scan, EF 51%  . Eye surgery      Allergies  Allergen Reactions  . Amoxicillin     Severe thrush with crusting and ulceration of back of throat.  . Codeine Nausea Only  . Tramadol Nausea Only    Current Outpatient Prescriptions  Medication Sig Dispense Refill  . allopurinol (ZYLOPRIM) 100 MG tablet Take 50 mg by mouth daily.     Marland Kitchen amLODipine (NORVASC) 5 MG tablet TAKE 1 TABLET BY MOUTH DAILY. 90 tablet 3  . aspirin 81 MG tablet Take 81 mg by mouth daily.      Marland Kitchen atenolol (TENORMIN) 100 MG tablet Take 100 mg by mouth daily.    . benazepril (LOTENSIN) 20 MG tablet Take 20 mg by mouth daily.    . cephALEXin (KEFLEX) 500  MG capsule   0  . febuxostat (ULORIC) 40 MG tablet Take 40 mg by mouth daily.     . furosemide (LASIX) 20 MG tablet Take 1 tablet by mouth Daily.    . lansoprazole (PREVACID) 30 MG capsule Take 30 mg by mouth daily.      Marland Kitchen levothyroxine (SYNTHROID, LEVOTHROID) 100 MCG tablet Take 100 mcg by mouth daily.      . potassium chloride SA (K-DUR,KLOR-CON) 20 MEQ tablet Take 20 mEq by mouth every Monday, Wednesday, and Friday.     . sodium chloride irrigation 0.9 % irrigation   1  . tamsulosin (FLOMAX) 0.4 MG CAPS Take 0.4 mg by mouth daily.    Marland Kitchen warfarin (COUMADIN) 5 MG tablet Take 5 mg by mouth daily. Take 5 mg daily except take 2.5 mg on mondays    . atenolol (TENORMIN) 25 MG tablet   2   No current facility-administered medications for this visit.    Review of Systems : See HPI for pertinent positives and negatives.  Physical Examination  Filed Vitals:   12/24/13 1155 12/24/13 1158  BP: 124/67 109/68  Pulse: 60 60  Resp:  16  Height:  5' 10.5" (1.791 m)  Weight:  161 lb (73.029 kg)  SpO2:  96%   Body mass index is 22.77 kg/(m^2).  General: WDWN male in NAD GAIT: normal Eyes: Pupils = Pulmonary: CTAB, Negative Rales, Negative rhonchi, & Negative wheezing.  Cardiac: regular Rhythm, postive murmur detected, pacemaker noted subcutaneous on right upper chest.  VASCULAR EXAM Carotid Bruits Left Right   Negative Negative   Aorta is not palpable. Radial pulses are 3+ palpable and equal.      LE Pulses LEFT RIGHT   POPLITEAL not palpable  not palpable  DP Not palpable palpable  PT palpable Not palpable    Gastrointestinal: soft, nontender, BS WNL, no r/g, no palpable masses.  Musculoskeletal: Negative muscle atrophy/wasting. M/S 5/5 throughout, Extremities without ischemic changes.  Neurologic: A&O X 3; Appropriate Affect ;  SENSATION ;normal;  Speech is normal CN 2-12 intact, Pain and light touch intact in extremities, Motor exam as listed above.    Non-Invasive Vascular Imaging CAROTID DUPLEX 12/24/2013   CEREBROVASCULAR DUPLEX EVALUATION    INDICATION: Follow-up carotid disease     PREVIOUS INTERVENTION(S):     DUPLEX EXAM:     RIGHT  LEFT  Peak Systolic Velocities (cm/s) End Diastolic Velocities (cm/s) Plaque LOCATION Peak Systolic Velocities (cm/s) End Diastolic Velocities (cm/s) Plaque  61 11  CCA PROXIMAL 91 16   69 11 CP CCA  MID 75 14 CP  66 14 HT CCA DISTAL 64 17 HT  109 11 CP ECA 161 13 CP  72 14 CP ICA PROXIMAL 183 46 CP  61 15  ICA MID 189 27   76 20  ICA DISTAL 108 23     1.1 ICA / CCA Ratio (PSV) 2.9  Antegrade  Vertebral Flow Antegrade    Brachial Systolic Pressure (mmHg)   Within normal limits  Brachial Artery Waveforms Within normal limits     Plaque Morphology:  HM = Homogeneous, HT = Heterogeneous, CP = Calcific Plaque, SP = Smooth Plaque, IP = Irregular Plaque  ADDITIONAL FINDINGS:     IMPRESSION: 1. Evidence of <40% stenosis of the right internal carotid artery. 2. Evidence of 40%-59% stenosis of the left internal carotid artery; however, plaque is densely calcific which may underestimate disease category. 3. Left external carotid artery stenosis is present. 4. Bilateral vertebral artery is antegrade.    Compared to the previous exam:  No significant change compared to prior exam.       Assessment: [name] [name] is a 80 y.o. male who presents with asymptomatic 40 - 59 % right ICA stensosis and <40% left ICA stenosis. The  ICA stenosis is  Unchanged from previous exam.  Plan: Follow-up in 1 year with Carotid Duplex.   I discussed in depth with the patient the nature of atherosclerosis, and emphasized the importance of maximal medical management including strict control of blood pressure, blood glucose, and lipid levels, obtaining regular exercise, and continued  cessation of smoking.  The patient is aware that without maximal medical management the underlying atherosclerotic disease process will progress, limiting the benefit of any interventions. The patient was given information about stroke prevention and what symptoms should prompt the patient to seek immediate medical care. Thank you for allowing [place] to participate in this patient's care.  Clemon Chambers, RN, MSN, FNP-C [facility] Office: [phone]  Clinic Physician: Kellie Simmering  12/24/2013 12:15 PM

## 2013-12-24 NOTE — Patient Instructions (Signed)
Stroke Prevention Some medical conditions and behaviors are associated with an increased chance of having a stroke. You may prevent a stroke by making healthy choices and managing medical conditions. HOW CAN I REDUCE MY RISK OF HAVING A STROKE?   Stay physically active. Get at least 30 minutes of activity on most or all days.  Do not smoke. It may also be helpful to avoid exposure to secondhand smoke.  Limit alcohol use. Moderate alcohol use is considered to be:  No more than 2 drinks per day for men.  No more than 1 drink per day for nonpregnant women.  Eat healthy foods. This involves:  Eating 5 or more servings of fruits and vegetables a day.  Making dietary changes that address high blood pressure (hypertension), high cholesterol, diabetes, or obesity.  Manage your cholesterol levels.  Making food choices that are high in fiber and low in saturated fat, trans fat, and cholesterol may control cholesterol levels.  Take any prescribed medicines to control cholesterol as directed by your health care provider.  Manage your diabetes.  Controlling your carbohydrate and sugar intake is recommended to manage diabetes.  Take any prescribed medicines to control diabetes as directed by your health care provider.  Control your hypertension.  Making food choices that are low in salt (sodium), saturated fat, trans fat, and cholesterol is recommended to manage hypertension.  Take any prescribed medicines to control hypertension as directed by your health care provider.  Maintain a healthy weight.  Reducing calorie intake and making food choices that are low in sodium, saturated fat, trans fat, and cholesterol are recommended to manage weight.  Stop drug abuse.  Avoid taking birth control pills.  Talk to your health care provider about the risks of taking birth control pills if you are over 35 years old, smoke, get migraines, or have ever had a blood clot.  Get evaluated for sleep  disorders (sleep apnea).  Talk to your health care provider about getting a sleep evaluation if you snore a lot or have excessive sleepiness.  Take medicines only as directed by your health care provider.  For some people, aspirin or blood thinners (anticoagulants) are helpful in reducing the risk of forming abnormal blood clots that can lead to stroke. If you have the irregular heart rhythm of atrial fibrillation, you should be on a blood thinner unless there is a good reason you cannot take them.  Understand all your medicine instructions.  Make sure that other conditions (such as anemia or atherosclerosis) are addressed. SEEK IMMEDIATE MEDICAL CARE IF:   You have sudden weakness or numbness of the face, arm, or leg, especially on one side of the body.  Your face or eyelid droops to one side.  You have sudden confusion.  You have trouble speaking (aphasia) or understanding.  You have sudden trouble seeing in one or both eyes.  You have sudden trouble walking.  You have dizziness.  You have a loss of balance or coordination.  You have a sudden, severe headache with no known cause.  You have new chest pain or an irregular heartbeat. Any of these symptoms may represent a serious problem that is an emergency. Do not wait to see if the symptoms will go away. Get medical help at once. Call your local emergency services (911 in [place] Do not drive yourself to the hospital. Document Released: 02/25/2004 Document Revised: 06/03/2013 Document Reviewed: 07/20/2012 Exit[employer] Patient Information 2015 [employer]. This information is not intended to replace advice given   to you by your health care provider. Make sure you discuss any questions you have with your health care provider.  

## 2013-12-25 ENCOUNTER — Telehealth: Payer: Self-pay | Admitting: Cardiology

## 2013-12-25 ENCOUNTER — Ambulatory Visit (INDEPENDENT_AMBULATORY_CARE_PROVIDER_SITE_OTHER): Payer: Medicare Other | Admitting: *Deleted

## 2013-12-25 DIAGNOSIS — I442 Atrioventricular block, complete: Secondary | ICD-10-CM

## 2013-12-25 DIAGNOSIS — I482 Chronic atrial fibrillation, unspecified: Secondary | ICD-10-CM

## 2013-12-25 NOTE — Addendum Note (Signed)
Addended by: Dorthula Rue L on: 12/25/2013 09:19 AM   Modules accepted: Orders

## 2013-12-25 NOTE — Telephone Encounter (Signed)
LMOVM reminding pt to send remote transmission.   

## 2013-12-27 DIAGNOSIS — I482 Chronic atrial fibrillation: Secondary | ICD-10-CM

## 2013-12-27 DIAGNOSIS — I442 Atrioventricular block, complete: Secondary | ICD-10-CM

## 2013-12-31 NOTE — Progress Notes (Signed)
Remote pacemaker transmission.   

## 2014-01-01 LAB — MDC_IDC_ENUM_SESS_TYPE_REMOTE
Battery Voltage: 2.78 V
Brady Statistic RV Percent Paced: 99 %
Lead Channel Impedance Value: 67 Ohm
Lead Channel Impedance Value: 783 Ohm
Lead Channel Pacing Threshold Amplitude: 0.5 V
Lead Channel Pacing Threshold Pulse Width: 0.4 ms
Lead Channel Setting Pacing Amplitude: 2.5 V
Lead Channel Setting Pacing Pulse Width: 0.4 ms
Lead Channel Setting Sensing Sensitivity: 2.8 mV
MDC IDC MSMT BATTERY IMPEDANCE: 135 Ohm
MDC IDC MSMT BATTERY REMAINING LONGEVITY: 146 mo
MDC IDC SESS DTM: 20151127161550

## 2014-01-06 ENCOUNTER — Encounter: Payer: Self-pay | Admitting: Cardiology

## 2014-01-07 ENCOUNTER — Other Ambulatory Visit: Payer: Self-pay | Admitting: Dermatology

## 2014-01-09 ENCOUNTER — Encounter (HOSPITAL_COMMUNITY): Payer: Self-pay | Admitting: Emergency Medicine

## 2014-01-09 ENCOUNTER — Emergency Department (HOSPITAL_COMMUNITY)
Admission: EM | Admit: 2014-01-09 | Discharge: 2014-01-09 | Disposition: A | Discharge status: Home / Self Care | Payer: Medicare Other | Attending: Emergency Medicine | Admitting: Emergency Medicine

## 2014-01-09 DIAGNOSIS — I509 Heart failure, unspecified: Secondary | ICD-10-CM | POA: Insufficient documentation

## 2014-01-09 DIAGNOSIS — Z87891 Personal history of nicotine dependence: Secondary | ICD-10-CM | POA: Insufficient documentation

## 2014-01-09 DIAGNOSIS — I10 Essential (primary) hypertension: Secondary | ICD-10-CM | POA: Insufficient documentation

## 2014-01-09 DIAGNOSIS — Z880 Allergy status to penicillin: Secondary | ICD-10-CM | POA: Diagnosis not present

## 2014-01-09 DIAGNOSIS — L7622 Postprocedural hemorrhage and hematoma of skin and subcutaneous tissue following other procedure: Secondary | ICD-10-CM | POA: Diagnosis not present

## 2014-01-09 DIAGNOSIS — I252 Old myocardial infarction: Secondary | ICD-10-CM | POA: Insufficient documentation

## 2014-01-09 DIAGNOSIS — T148XXA Other injury of unspecified body region, initial encounter: Secondary | ICD-10-CM

## 2014-01-09 DIAGNOSIS — Z9889 Other specified postprocedural states: Secondary | ICD-10-CM | POA: Diagnosis not present

## 2014-01-09 DIAGNOSIS — I4891 Unspecified atrial fibrillation: Secondary | ICD-10-CM | POA: Diagnosis not present

## 2014-01-09 DIAGNOSIS — M109 Gout, unspecified: Secondary | ICD-10-CM | POA: Diagnosis not present

## 2014-01-09 DIAGNOSIS — I2510 Atherosclerotic heart disease of native coronary artery without angina pectoris: Secondary | ICD-10-CM | POA: Insufficient documentation

## 2014-01-09 DIAGNOSIS — Z7901 Long term (current) use of anticoagulants: Secondary | ICD-10-CM | POA: Insufficient documentation

## 2014-01-09 DIAGNOSIS — Z951 Presence of aortocoronary bypass graft: Secondary | ICD-10-CM | POA: Diagnosis not present

## 2014-01-09 DIAGNOSIS — Z79899 Other long term (current) drug therapy: Secondary | ICD-10-CM | POA: Insufficient documentation

## 2014-01-09 DIAGNOSIS — Z950 Presence of cardiac pacemaker: Secondary | ICD-10-CM | POA: Diagnosis not present

## 2014-01-09 DIAGNOSIS — Z8669 Personal history of other diseases of the nervous system and sense organs: Secondary | ICD-10-CM | POA: Diagnosis not present

## 2014-01-09 DIAGNOSIS — E039 Hypothyroidism, unspecified: Secondary | ICD-10-CM | POA: Insufficient documentation

## 2014-01-09 DIAGNOSIS — K219 Gastro-esophageal reflux disease without esophagitis: Secondary | ICD-10-CM | POA: Insufficient documentation

## 2014-01-09 DIAGNOSIS — Z7982 Long term (current) use of aspirin: Secondary | ICD-10-CM | POA: Diagnosis not present

## 2014-01-09 DIAGNOSIS — M1990 Unspecified osteoarthritis, unspecified site: Secondary | ICD-10-CM | POA: Insufficient documentation

## 2014-01-09 LAB — PROTIME-INR
INR: 1.9 — ABNORMAL HIGH (ref 0.00–1.49)
Prothrombin Time: 21.9 seconds — ABNORMAL HIGH (ref 11.6–15.2)

## 2014-01-09 MED ORDER — LIDOCAINE-EPINEPHRINE 2 %-1:100000 IJ SOLN
INTRAMUSCULAR | Status: AC
  Filled 2014-01-09: qty 1

## 2014-01-09 MED ORDER — LIDOCAINE-EPINEPHRINE 2 %-1:100000 IJ SOLN
20.0000 mL | Freq: Once | INTRAMUSCULAR | Status: AC
  Administered 2014-01-09: 20 mL via INTRADERMAL

## 2014-01-09 NOTE — ED Provider Notes (Signed)
CSN: [id number]     Arrival date & time 01/09/14  0134 History   First MD Initiated Contact with Patient 01/09/14 [phone]     Chief Complaint  Patient presents with  . Wound Bleeding      (Consider location/radiation/quality/duration/timing/severity/associated sxs/prior Treatment) HPI  This is an 80 year old male who had a shave biopsy for skin cancer on his right buttock yesterday. The wound has been oozing blood since the procedure and has not stopped bleeding despite application of multiple dressings. He is having minimal pain and no associated systemic symptoms. He is on Coumadin for atrial fibrillation.  Past Medical History  Diagnosis Date  . Sleep apnea   . Atrial fibrillation   . SSS (sick sinus syndrome)   . Coronary artery disease   . Hypertension   . Hyperlipidemia   . Pulmonary hypertension   . History of interstitial lung disease   . Diverticulosis   . Esophageal dilatation   . Hypothyroidism   . Gout   . Myocardial infarction   . Pacemaker     Medtronic  . SOB (shortness of breath) on exertion     sees dr. Lamonte Sakai occassionally  . GERD (gastroesophageal reflux disease)   . Arthritis     neck  . Allergy     codeine  . CHF (congestive heart failure)   . 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 Carotid artery occlusion    Past Surgical History  Procedure Laterality Date  . Cardiac catheterization  08/08/2006    EF 50%  . Cardiac catheterization  07/26/2003    EF 40%  . Insert / replace / remove pacemaker      IMPLANT  . Coronary artery bypass graft  08/06/2003    3Vessel LIMA GRAFT TO THE LAD, SAPHENOUS VEIN GRAFT TO THE CIRCUMFLEX, SAPHENOUS VEIN GRAFT TO THE RIGHT POSTERIOR LATERAL BRANCH  . Mitral and tricuspid valve annuloplasty  08/06/2003    Dr. Roxy Manns  . Polypectomy    . Cataract extraction    . Cox-maze microwave ablation    . US echocardiography  07/12/2006    EF 50-55%  . US echocardiography  12/16/2003    EF 55-60%  . Cardiovascular stress  test  07/11/2006    EF 50%  . Mass excision N/A 04/20/2012    Procedure: EXCISION OF LOWER LIP CANCER AND EXCISION SUBMENTAL LYMPH NODES;  Surgeon: Rozetta Nunnery, MD;  Location: [place];  Service: ENT;  Laterality: N/A;  . Radical neck dissection Left 08/17/2012    Procedure: LEFT NECK DISSECTION;  Surgeon: Rozetta Nunnery, MD;  Location: [place];  Service: ENT;  Laterality: Left;  . Cardiovascular stress test  06/17/2013    low risk scan, EF 51%  . Eye surgery     Family History  Problem Relation Age of Onset  . Hypertension Mother   . Hyperlipidemia Mother   . Stroke Father   . Coronary artery disease Brother   . Hypertension Brother   . Hyperlipidemia Brother    History  Substance Use Topics  . Smoking status: Former Smoker -- 1.00 packs/day for 15 years    Types: Cigarettes    Quit date: 09/20/1980  . Smokeless tobacco: Never Used  . Alcohol Use: 1.2 oz/week    2 Glasses of wine per week     Comment: 2 glasses of wine daily    Review of Systems  All other systems reviewed and are negative.   Allergies  Amoxicillin; Codeine; and Tramadol  Home Medications   Prior to Admission medications   Medication Sig Start Date End Date Taking? Authorizing Provider  allopurinol (ZYLOPRIM) 100 MG tablet Take 50 mg by mouth daily.    Yes Historical Provider, MD  amLODipine (NORVASC) 5 MG tablet TAKE 1 TABLET BY MOUTH DAILY. 01/01/13  Yes Peter M Martinique, MD  aspirin 81 MG tablet Take 81 mg by mouth daily.     Yes Historical Provider, MD  atenolol (TENORMIN) 100 MG tablet Take 100 mg by mouth daily.   Yes Historical Provider, MD  benazepril (LOTENSIN) 20 MG tablet Take 20 mg by mouth daily.   Yes Historical Provider, MD  cholecalciferol (VITAMIN D) 1000 UNITS tablet Take 1,000 Units by mouth daily.   Yes Historical Provider, MD  febuxostat (ULORIC) 40 MG tablet Take 40 mg by mouth daily.    Yes Historical Provider, MD  furosemide (LASIX) 20 MG tablet Take 1 tablet by mouth Daily.  07/07/10  Yes Historical Provider, MD  lansoprazole (PREVACID) 30 MG capsule Take 30 mg by mouth daily.     Yes Historical Provider, MD  levothyroxine (SYNTHROID, LEVOTHROID) 100 MCG tablet Take 100 mcg by mouth daily.     Yes Historical Provider, MD  potassium chloride SA (K-DUR,KLOR-CON) 20 MEQ tablet Take 20 mEq by mouth every Monday, Wednesday, and Friday.    Yes Historical Provider, MD  tamsulosin (FLOMAX) 0.4 MG CAPS Take 0.4 mg by mouth daily.   Yes Historical Provider, MD  vitamin B-12 (CYANOCOBALAMIN) 100 MCG tablet Take 100 mcg by mouth daily.   Yes Historical Provider, MD  warfarin (COUMADIN) 5 MG tablet Take 5 mg by mouth daily. Take 5 mg daily except take 2.5 mg on mondays   Yes Historical Provider, MD   BP 126/63 mmHg  Pulse 63  Temp(Src) 97.5 F (36.4 C) (Oral)  Resp 20  SpO2 93%   Physical Exam  General: Well-developed, well-nourished male in no acute distress; appearance consistent with age of record HENT: normocephalic; atraumatic Eyes: pupils equal, round and reactive to light; extraocular muscles intact Neck: supple Heart: regular rate and rhythm Lungs: Normal respiratory effort and excursion Chest: Pacemaker Abdomen: soft; nondistended; nontender Extremities: No deformity; full range of motion; pulses normal Neurologic: Awake, alert and oriented; motor function intact in all extremities and symmetric; no facial droop Skin: Warm and dry; multiple chronic appearing skin lesions; bleeding biopsy site overlying right greater trochanter Psychiatric: Normal mood and affect    ED Course  Procedures (including critical care time)  HEMOSTASIS WoundSeal was placed onto wound and the site covered with a pressure dressing.   MDM  Nursing notes and vitals signs, including pulse oximetry, reviewed.  Summary of this visit's results, reviewed by myself:  Labs:  Results for orders placed or performed during the hospital encounter of 01/09/14 (from the past 24 hour(s))   Protime-INR     Status: Abnormal   Collection Time: 01/09/14  4:00 AM  Result Value Ref Range   Prothrombin Time 21.9 (H) 11.6 - 15.2 seconds   INR 1.90 (H) 0.00 - 1.49   4:41 AM Wound still oozing through central punctum. Wound injected with 1.5 milliliters of 2% lidocaine with epinephrine. Pressure dressing was reapplied.  5:03 AM Wound now hemostatic.   Wynetta Fines, MD 01/09/14 [phone]

## 2014-01-09 NOTE — ED Notes (Signed)
Assisted MD with application of quick clot to right buttock area. Pressure dressing applied. Procedure explained to patient prior to completion.

## 2014-01-09 NOTE — ED Notes (Signed)
Patient wound is estimated 0.25-0.5 inch, circular. Removed bandage that was in place. It was soaked with blood. Area is still bleeding, applied another clean bandage over site.

## 2014-01-09 NOTE — ED Notes (Signed)
Pt from home c/o bleeding from a skin lesion that was biopsied today. Pt on coumadin and reports that it won't stop bleeding.

## 2014-01-09 NOTE — Discharge Instructions (Signed)
See [facility] instructions.

## 2014-01-09 NOTE — ED Notes (Signed)
Pressure dressing removed per MD request, bleeding controlled, no active bleeding at this time. Two Sterile 2x2s applied with bandage. Patient advised he is being discharged and he may get dressed.

## 2014-01-09 NOTE — ED Notes (Signed)
MD at bedside. Wound continues to bleed. Lido with Epi given to MD for intradermal injection.

## 2014-01-10 ENCOUNTER — Telehealth: Payer: Self-pay | Admitting: Neurology

## 2014-01-10 NOTE — Telephone Encounter (Signed)
Patient has questions about  oxygen ordered

## 2014-01-10 NOTE — Telephone Encounter (Signed)
Pt is calling regarding being put on Oxygen.  He states he didn't know anything about this until [facility] called him.  He has questions and would like for the doctor to call him back.

## 2014-01-10 NOTE — Telephone Encounter (Signed)
Patient is returning your call.  

## 2014-01-10 NOTE — Telephone Encounter (Signed)
Called patient to get more information concerning questions for the doctor about being placed on oxygen

## 2014-01-10 NOTE — Telephone Encounter (Signed)
Returned call back to patient, lt Vm for call back

## 2014-01-13 ENCOUNTER — Encounter: Payer: Self-pay | Admitting: Internal Medicine

## 2014-01-13 NOTE — Telephone Encounter (Signed)
Patient said that he had decided not to go with the oxygen ordered

## 2014-01-13 NOTE — Telephone Encounter (Signed)
Patient declined oxygen order

## 2014-01-15 ENCOUNTER — Ambulatory Visit (INDEPENDENT_AMBULATORY_CARE_PROVIDER_SITE_OTHER): Payer: Medicare Other | Admitting: Pharmacist Clinician (PhC)/ Clinical Pharmacy Specialist

## 2014-01-15 DIAGNOSIS — Z950 Presence of cardiac pacemaker: Secondary | ICD-10-CM

## 2014-01-15 DIAGNOSIS — I482 Chronic atrial fibrillation: Secondary | ICD-10-CM

## 2014-01-15 LAB — POCT INR: INR: 1.3

## 2014-03-05 ENCOUNTER — Other Ambulatory Visit: Payer: Self-pay

## 2014-03-31 ENCOUNTER — Ambulatory Visit (INDEPENDENT_AMBULATORY_CARE_PROVIDER_SITE_OTHER): Payer: Medicare Other | Admitting: *Deleted

## 2014-03-31 DIAGNOSIS — I495 Sick sinus syndrome: Secondary | ICD-10-CM

## 2014-03-31 LAB — MDC_IDC_ENUM_SESS_TYPE_REMOTE
Battery Impedance: 135 Ohm
Battery Remaining Longevity: 145 mo
Brady Statistic RV Percent Paced: 99 %
Date Time Interrogation Session: 20160229181105
Lead Channel Impedance Value: 67 Ohm
Lead Channel Impedance Value: 731 Ohm
Lead Channel Setting Pacing Amplitude: 2.5 V
Lead Channel Setting Pacing Pulse Width: 0.4 ms
Lead Channel Setting Sensing Sensitivity: 2.8 mV
MDC IDC MSMT BATTERY VOLTAGE: 2.78 V
MDC IDC MSMT LEADCHNL RV PACING THRESHOLD AMPLITUDE: 0.625 V
MDC IDC MSMT LEADCHNL RV PACING THRESHOLD PULSEWIDTH: 0.4 ms

## 2014-03-31 NOTE — Progress Notes (Signed)
Remote pacemaker transmission.   

## 2014-04-15 ENCOUNTER — Ambulatory Visit: Payer: Medicare Other | Admitting: Neurology

## 2014-05-06 ENCOUNTER — Encounter: Payer: Self-pay | Admitting: Cardiology

## 2014-05-15 ENCOUNTER — Encounter: Payer: Self-pay | Admitting: Internal Medicine

## 2014-06-03 ENCOUNTER — Encounter: Payer: Self-pay | Admitting: Internal Medicine

## 2014-08-01 ENCOUNTER — Encounter: Payer: Self-pay | Admitting: Internal Medicine

## 2014-08-12 ENCOUNTER — Encounter: Payer: Self-pay | Admitting: Internal Medicine

## 2014-08-29 ENCOUNTER — Encounter: Payer: Self-pay | Admitting: Internal Medicine

## 2014-08-29 ENCOUNTER — Ambulatory Visit (INDEPENDENT_AMBULATORY_CARE_PROVIDER_SITE_OTHER): Payer: Medicare Other | Admitting: Internal Medicine

## 2014-08-29 VITALS — BP 112/60 | HR 79 | Ht 70.5 in | Wt 157.0 lb

## 2014-08-29 DIAGNOSIS — I10 Essential (primary) hypertension: Secondary | ICD-10-CM | POA: Diagnosis not present

## 2014-08-29 DIAGNOSIS — I442 Atrioventricular block, complete: Secondary | ICD-10-CM

## 2014-08-29 DIAGNOSIS — Z950 Presence of cardiac pacemaker: Secondary | ICD-10-CM | POA: Diagnosis not present

## 2014-08-29 NOTE — Progress Notes (Signed)
Patient Care Team: Crist Infante, MD as PCP - General Peter M Martinique, MD as Consulting Physician (Cardiology) Deboraha Sprang, MD as Consulting Physician (Cardiology)   HPI  [name] [name] is a 81 y.o. male Seen in followup for pacemaker implanted in the context of permanent atrial fibrillation.  Is a history of bypass surgery with prior tricuspid and mitral valve annuloplasty. In 2008 he underwent catheterization because of dyspnea. Bypasses were patent and left ventricular function was normal  He denies chest pain. He has chronic shortness of breath. There is no edema.   His pacemaker reached ERI last year he underwent generator replacement 5/15.  Past Medical History  Diagnosis Date  . Sleep apnea   . Atrial fibrillation   . SSS (sick sinus syndrome)   . Coronary artery disease   . Hypertension   . Hyperlipidemia   . Pulmonary hypertension   . History of interstitial lung disease   . Diverticulosis   . Esophageal dilatation   . Hypothyroidism   . Gout   . Myocardial infarction   . Pacemaker     Medtronic  . SOB (shortness of breath) on exertion     sees dr. Lamonte Sakai occassionally  . GERD (gastroesophageal reflux disease)   . Arthritis     neck  . Allergy     codeine  . CHF (congestive heart failure)   . 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 Carotid artery occlusion     Past Surgical History  Procedure Laterality Date  . Cardiac catheterization  08/08/2006    EF 50%  . Cardiac catheterization  07/26/2003    EF 40%  . Insert / replace / remove pacemaker      IMPLANT  . Coronary artery bypass graft  08/06/2003    3Vessel LIMA GRAFT TO THE LAD, SAPHENOUS VEIN GRAFT TO THE CIRCUMFLEX, SAPHENOUS VEIN GRAFT TO THE RIGHT POSTERIOR LATERAL BRANCH  . Mitral and tricuspid valve annuloplasty  08/06/2003    Dr. Roxy Manns  . Polypectomy    . Cataract extraction    . Cox-maze microwave ablation    . US echocardiography  07/12/2006    EF 50-55%  . US  echocardiography  12/16/2003    EF 55-60%  . Cardiovascular stress test  07/11/2006    EF 50%  . Mass excision N/A 04/20/2012    Procedure: EXCISION OF LOWER LIP CANCER AND EXCISION SUBMENTAL LYMPH NODES;  Surgeon: Rozetta Nunnery, MD;  Location: [place];  Service: ENT;  Laterality: N/A;  . Radical neck dissection Left 08/17/2012    Procedure: LEFT NECK DISSECTION;  Surgeon: Rozetta Nunnery, MD;  Location: [place];  Service: ENT;  Laterality: Left;  . Cardiovascular stress test  06/17/2013    low risk scan, EF 51%  . Eye surgery    . Permanent pacemaker generator change N/A 06/13/2013    Procedure: PERMANENT PACEMAKER GENERATOR CHANGE;  Surgeon: Deboraha Sprang, MD;  Location[facility] C[facility];  Service: Cardiovascular;  Laterality: N/A;    Current Outpatient Prescriptions  Medication Sig Dispense Refill  . allopurinol (ZYLOPRIM) 100 MG tablet Take 50 mg by mouth daily.     Marland Kitchen amLODipine (NORVASC) 5 MG tablet TAKE 1 TABLET BY MOUTH DAILY. 90 tablet 3  . amoxicillin (AMOXIL) 500 MG capsule TAKE 4 CAPSULES BY MOUTH 1 HOUR PRIOR TO APPOINTMENT  2  . apixaban (ELIQUIS) 2.5 MG TABS tablet Take 2.5 mg by mouth 2 (  two) times daily.    Marland Kitchen aspirin 81 MG tablet Take 81 mg by mouth daily.      Marland Kitchen atenolol (TENORMIN) 100 MG tablet Take 100 mg by mouth daily.    . benazepril (LOTENSIN) 20 MG tablet Take 20 mg by mouth daily.    . cholecalciferol (VITAMIN D) 1000 UNITS tablet Take 1,000 Units by mouth daily.    . febuxostat (ULORIC) 40 MG tablet Take 40 mg by mouth daily.     . furosemide (LASIX) 20 MG tablet Take 1 tablet by mouth Daily.    . lansoprazole (PREVACID) 30 MG capsule Take 30 mg by mouth daily.      Marland Kitchen levothyroxine (SYNTHROID, LEVOTHROID) 100 MCG tablet Take 100 mcg by mouth daily.      Marland Kitchen nystatin (MYCOSTATIN) 100000 UNIT/ML suspension Use as directed 100,000 Units in the mouth or throat 4 (four) times daily.   0  . potassium chloride SA (K-DUR,KLOR-CON) 20 MEQ tablet Take 20 mEq by mouth every  Monday, Wednesday, and Friday.     . tamsulosin (FLOMAX) 0.4 MG CAPS Take 0.4 mg by mouth daily.    . vitamin B-12 (CYANOCOBALAMIN) 100 MCG tablet Take 100 mcg by mouth daily.    Marland Kitchen warfarin (COUMADIN) 5 MG tablet Take 5 mg by mouth daily. Take 5 mg daily except take 2.5 mg on mondays     No current facility-administered medications for this visit.    Allergies  Allergen Reactions  . Amoxicillin     Severe thrush with crusting and ulceration of back of throat.  . Codeine Nausea Only  . Tramadol Nausea Only    Review of Systems negative except from HPI and PMH  Physical Exam BP 112/60 mmHg  Pulse 79  Ht 5' 10.5" (1.791 m)  Wt 157 lb (71.215 kg)  BMI 22.20 kg/m2 Well developed and nourished in no acute distress HENT normal Neck supple with JVP-flat Clear Device pocket well healed; without hematoma or erythema.  There is no tethering   Regular rate and rhythm, no murmurs or gallops Abd-soft with active BS No Clubbing cyanosis edema Skin-warm and dry A & Oriented  Grossly normal sensory and motor function     Assessment and  Plan  Tachybradycardia syndrome  Atrial fibrillation-permanent  Ischemic heart disease prior bypass with previously normal LV function  Pacemaker-Medtronic  The patient's device was interrogated.  The information was reviewed. No changes were made in the programming.

## 2014-10-02 ENCOUNTER — Ambulatory Visit (INDEPENDENT_AMBULATORY_CARE_PROVIDER_SITE_OTHER)
Admission: RE | Admit: 2014-10-02 | Discharge: 2014-10-02 | Disposition: A | Discharge status: Home / Self Care | Payer: Medicare Other | Source: Ambulatory Visit | Attending: Pulmonary Disease | Admitting: Pulmonary Disease

## 2014-10-02 ENCOUNTER — Ambulatory Visit (INDEPENDENT_AMBULATORY_CARE_PROVIDER_SITE_OTHER): Payer: Medicare Other | Admitting: Pulmonary Disease

## 2014-10-02 ENCOUNTER — Encounter: Payer: Self-pay | Admitting: Pulmonary Disease

## 2014-10-02 VITALS — BP 122/64 | HR 88 | Temp 97.6°F | Ht 68.0 in | Wt 164.0 lb

## 2014-10-02 DIAGNOSIS — I272 Pulmonary hypertension, unspecified: Secondary | ICD-10-CM

## 2014-10-02 DIAGNOSIS — R0600 Dyspnea, unspecified: Secondary | ICD-10-CM

## 2014-10-02 DIAGNOSIS — J8410 Pulmonary fibrosis, unspecified: Secondary | ICD-10-CM

## 2014-10-02 DIAGNOSIS — Z951 Presence of aortocoronary bypass graft: Secondary | ICD-10-CM | POA: Insufficient documentation

## 2014-10-02 DIAGNOSIS — I442 Atrioventricular block, complete: Secondary | ICD-10-CM

## 2014-10-02 DIAGNOSIS — I270 Primary pulmonary hypertension: Secondary | ICD-10-CM

## 2014-10-02 DIAGNOSIS — I481 Persistent atrial fibrillation: Secondary | ICD-10-CM

## 2014-10-02 DIAGNOSIS — I2510 Atherosclerotic heart disease of native coronary artery without angina pectoris: Secondary | ICD-10-CM

## 2014-10-02 DIAGNOSIS — G4733 Obstructive sleep apnea (adult) (pediatric): Secondary | ICD-10-CM | POA: Diagnosis not present

## 2014-10-02 DIAGNOSIS — I10 Essential (primary) hypertension: Secondary | ICD-10-CM

## 2014-10-02 DIAGNOSIS — Z950 Presence of cardiac pacemaker: Secondary | ICD-10-CM

## 2014-10-02 DIAGNOSIS — I4819 Other persistent atrial fibrillation: Secondary | ICD-10-CM

## 2014-10-02 MED ORDER — PREDNISONE 20 MG PO TABS: ORAL_TABLET | ORAL | Status: DC

## 2014-10-02 MED ORDER — CARVEDILOL 3.125 MG PO TABS: 3.1250 mg | ORAL_TABLET | Freq: Two times a day (BID) | ORAL | Status: DC

## 2014-10-02 NOTE — Patient Instructions (Signed)
Today we updated your med list in our EPIC system...     We decided to STOP your Atenolol & replace it w/ COREG 3.125mg  - one tab twice daily for your BP & heart...  For your breathing we decided to place you on a short course of PREDNISONE 20mg  tabs>>    Start w/ one tab twice daily for 5 days...    Then decrease to 1 tab each AM for 5 days...    Then decrease to 1/2 tab each AM for 5 days...    Then decrease to 1/2 tab every other day (1/2, 0, 1/2, 0, etc) til return visit...  Today we did a follow up CXR, pulmonary function test, and an ambulatory oxygen test...    We will arrange for an outpt Hi-resolution CT scan of your chest AND a 2DEchocardiogram for a full assessment...    We will contact you w/ the results when available...   Let's plan a follow up visit in 1 month, sooner if needed for any breathing problems.Marland KitchenMarland Kitchen

## 2014-10-05 ENCOUNTER — Encounter: Payer: Self-pay | Admitting: Pulmonary Disease

## 2014-10-05 NOTE — Progress Notes (Addendum)
Subjective:     Patient ID: [name] [name], male   DOB: 1930/07/13, 81 y.o.   MRN: [id number]  HPI 81 y/o WM referred by DrPerini for evaluation of dyspnea and interstitial lung disease>      [name] [name] is 59 w/ a signif PMHx of HBP, CAD- s/p 3 vessel CABG in 2005, valvular heart dis- s/p tricuspid & mitral annuloplasties 2005 by DrOwen, Hx SSS/ tachy-brady syndrome/ permanent Afib- s/p cox-maze ablation, pacer w/ generator change 5/15, periph vasc dis w/ 40-59% left ICA stenosis on CDoppler followed by Sheryn Bison yearly, HL- on Atenolol ?100(per Epic) ?25(per PCP), Adalberto Ill, Lotensin20, Lasix20, ASA81, Eliquis5Bid, not on statin...       He also has known ILD w/ prev eval by DrByrum> initially seen in 2008 on referral from DrJordan due to dyspnea- DOE on incline or stairs (f/u cath showed patent grafts and EF=50%); prev CXRs showed mild subpleural ILD/ scarring in a UIP pattern & Hi-res CT confirmed; they discussed poss Pred therapy but pt declined;  A subseq Ba swallow showed some laryngeal penetration & aspiration, improved w/ antireflux measures, chin tuck and pureed foods; occas bout of bronchitis treated w/ antibiotics but he didn't notice much improvement til he was given a tapering course of Pred...  He returned for f/u in 2010 when he noted increased dyspnea in the hot weather; CXR suggested sl progression of the fibrosis but ambulatory O2sats were still wnl;  He had Sleep eval 9/11 by Spencer (can't find data in Epic) but pt declined treatment;  He had his final recheck by DrByrum 11/12- CXR & CT Chest were similar to prev, pt declined CPAP, oxygen, etc (pt didn't return for follow up)...  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         He also has known OSA w/ complete sleep eval by [facility]> home sleep study 08/29/13 showed AHI=13.5/hr w/  O2desat to 82% and time <88%= 45min;  He had a CPAP titration study 09/25/13 w/ AHI decr to 3.8.hr on CPAP 9cmH2O & desat time <88% was 99min;  He was placed on CPAP & brought back for f/u w/ download 10/15 indicating good compliance (using CPAP 30/30 days, ave 6.5H/night) w/ AHI improved to 3.7 events per hour;  He changed from [facility] to nasal pillows & was clinically improved;  DrAthar reported that ONO on CPAP still showed signif hypoxemia (lowest O2sat=82%, time <88% was 2hours) & he was prescribed oxygen BUT THE PT REFUSED O2 THERAPY...       He is an ex-smoker w/ a 20 pack-year smoking hx & he quit in the 1970s; he is retired from [employer]- denies occupational exposures; he denies pets or birds, no unusual hobbies, etc...       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  EXAM reveals Afeb, VSS, O2sat=90% on RA at rest;  HEENT- s/p lip surg & neck node dissection;  Chest- diffuse velcro rales ~1/2 way up the chest, no wheezing or rhonchi;  Heart- RR Gr1/6 SEM w/o r/g;  Abd- soft, neg;  Ext- neg w/o c/c/e.   PFT 2008 showed normal airflow, normal lung volumes, but decreased DLCO;  Note- no desat noted after 3 laps in the office on RA...  CT Chest 2008 showed increased subpleural interstitial opacities  w/ LL predom- sl progressed from 2005 scan, felt to be a UIP pattern.  Collagen-vasc labs were neg  Ba swallow 2008 showed some laryngeal penetration & aspiration, improved w/ antireflux measures, chin tuck and pureed foods  He noted improvement after a tapering course of oral Pred 12/08...  Serial CXRs showed some sl progression of the fibrotic pattern... But he went to [place] and did well by his description...   Spirometry 5/11 showed FVC=2.83 (71%), FEV1=2.22 (78%), %1sec=78; and ambulatory O2sats were still wnl- no desat noted...   He was diagnosed w/ OSA 11/11 but as per his usual pattern of response- he "wanted to defer" treatment.  2DEcho 1/12 showed moderate LVH, normal LVF w/ EF 50-55% & no regional wall motion abnormalities study was not technically sufficient to allow evaluation of LV diastolic function; Mild AI, evidence of mitral valve repair & trivial MR; mild pulmonic regurgitation; evidence of tricuspid valve repair w/ mild TR; mildly dilated left atrium. PA peak pressure 53 mmHg (S)  Last recheck w/ DrByrum was 11/12> CXR showed heart at upper lim normal, s/p CABG & pacer, some increase in the pulmonary parenchymal dis (increased markings from 2008).  CT Chest 11/12 showed peripheral & basilar predom subpleural reticulation, traction bronchiectasis, architectural distortion, and honeycombing- similar to 2008, suggesting UIP, stable mediastinal adenopathy.  CXR 10/02/14 showed prev CABG & pacer, bilat heterogeneous areas of coarse peripheral reticular & hazy airsp opacities without substantial change from previous...  Spirometry 10/02/14> FVC=1.9 (52%), FEV1=1.7 (66%), %1sec=88, mid-flows wnl;  This is c/o mod severe restrictive ventilatory defect, no obstructive dis evident...  Ambulatory oxygen saturation test 10/02/14> O2sat=93% on RA at rest w/ pulse=61/min; He ambulated 2 laps and stopped due to dyspnea- lowest O2sat=89% w/ pulse=101/min...  Hi-res CT Chest >  pending  2DEcho > pending   IMP/PLAN>>  [name] [name] has interstitial lung disease- likely UIP/ IPF- with similar CXR & CT findings from the time of it's first detection in 2005 before his open heart surg, up until his last assessment in 2012;  Since then it appears as though his dyspnea has progressed along w/ his age (now 10) and assoc w/ worsening of his restrictive disease on his PFT- repeat CT Chest & 2DEcho are pending...  He also has OSA and documented signif nocturnal hypoxemia but has repeatedly refused home oxygen therapy on mult occas...  He also has pulmonary HTN suggested on prev 2DEcho w/ repeat Echo pending...  It has been pointed out to him before that his most important intervention would be institution of OXYGEN but he has refused several times over the last few yrs (this is likely because his wife has severe COPD & is on home oxygen as part of  her treatment for this condition)...  He big agenda today was to get off the Atenolol since he read on the internet that it can cause fatigue & SOB- we stopped this & switched him to COREG 3.125Bid...  We also decided to place him on a short tapering sched of oral PREDNISONE while we are waiting on the HRCT & f/u 2DEcho... ROV 73mo.  ADDENDUM>>  Hi-res CT Chest done 10/10/14 & showed mild cardiomeg, s/p CABG/ annulopasties/ maze procedure/ & right sided pacer, atherosclerotic dis in Ao/ great vessels/ all 3 coronoaries, +mediatinal adenopathy up to 67mm size;  ILD w/ ground glass attenuation in lungs bilat w/ extensive subpleural & peribronchovasc reticulation & thickening of the interstitium w/ mild craniocaudal gradient, mild traction bronchiectasis, mild honeycombing and only mild progression compared to 2012;  No lung nodules or masses => Although there are some features that could be suggestive of usual interstitial pneumonia (UIP), the minimal progression compared to the prior study, presence of extensive ground-glass attenuation, minimal honeycombing  and a lack of a strong craniocaudal gradient may suggest an alternative diagnosis of 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 ADDENDUM>>  2DEcho 10/10/14 showed mild LVH, mild AI/ MR/ and LAdil; RV is severely dilated w/ severely reduced RVF, mod TR, severe RAdil, and PAsys=22mmHg...  I called pt w/ these reports>  I insisted that he start Oxygen at 2L/min [place] continuously & set him up an appt to see [facility] for his opinion (pt notes that MR sees pt's wife & is very familiar w/ his case).    Past Medical History  Diagnosis Date  . Sleep apnea   . Atrial fibrillation   . SSS (sick sinus syndrome)   . Coronary artery disease   . Hypertension   . Hyperlipidemia   . Pulmonary hypertension   . History of interstitial lung disease   . Diverticulosis   . Esophageal dilatation   . Hypothyroidism   . Gout   . Myocardial infarction   . Pacemaker     Medtronic  . SOB (shortness of breath) on exertion     sees dr. Lamonte Sakai occassionally  . GERD (gastroesophageal reflux disease)   . Arthritis     neck  . Allergy     codeine  . CHF (congestive heart failure)   . 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 Carotid artery occlusion     Past Surgical History  Procedure Laterality Date  . Cardiac catheterization  08/08/2006    EF 50%  . Cardiac catheterization  07/26/2003    EF 40%  . Insert / replace / remove pacemaker      IMPLANT  . Coronary artery bypass graft  08/06/2003    3Vessel LIMA GRAFT TO THE LAD, SAPHENOUS VEIN GRAFT TO THE CIRCUMFLEX, SAPHENOUS VEIN GRAFT TO THE RIGHT POSTERIOR LATERAL BRANCH  . Mitral and tricuspid valve annuloplasty  08/06/2003    Dr. Roxy Manns  . Polypectomy    . Cataract extraction    . Cox-maze microwave ablation    . US echocardiography  07/12/2006    EF 50-55%  . US echocardiography  12/16/2003    EF 55-60%  . Cardiovascular stress test  07/11/2006    EF 50%  . Mass excision N/A 04/20/2012    Procedure: EXCISION OF LOWER LIP  CANCER AND EXCISION SUBMENTAL LYMPH NODES;  Surgeon: Rozetta Nunnery, MD;  Location: [place];  Service: ENT;  Laterality: N/A;  . Radical neck dissection Left 08/17/2012  Procedure: LEFT NECK DISSECTION;  Surgeon: Rozetta Nunnery, MD;  Location: [place];  Service: ENT;  Laterality: Left;  . Cardiovascular stress test  06/17/2013    low risk scan, EF 51%  . Eye surgery    . Permanent pacemaker generator change N/A 06/13/2013    Procedure: PERMANENT PACEMAKER GENERATOR CHANGE;  Surgeon: Deboraha Sprang, MD;  Location: [facility] CATH LAB;  Service: Cardiovascular;  Laterality: N/A;    Outpatient Encounter Prescriptions as of 10/02/2014  Medication Sig  . amLODipine (NORVASC) 5 MG tablet TAKE 1 TABLET BY MOUTH DAILY.  Marland Kitchen apixaban (ELIQUIS) 2.5 MG TABS tablet Take 2.5 mg by mouth 2 (two) times daily.  Marland Kitchen aspirin 81 MG tablet Take 81 mg by mouth daily.    . benazepril (LOTENSIN) 20 MG tablet Take 20 mg by mouth daily.  . cholecalciferol (VITAMIN D) 1000 UNITS tablet Take 1,000 Units by mouth daily.  . febuxostat (ULORIC) 40 MG tablet Take 40 mg by mouth daily.   . furosemide (LASIX) 20 MG tablet Take 1 tablet by mouth Daily.  . lansoprazole (PREVACID) 30 MG capsule Take 30 mg by mouth daily.    Marland Kitchen levothyroxine (SYNTHROID, LEVOTHROID) 100 MCG tablet Take 100 mcg by mouth daily.    Marland Kitchen PROAIR HFA 108 (90 BASE) MCG/ACT inhaler USE 2 PUFFS EVERY 4 HOURS AS NEEDED FOR WHEEZE, COUGH, SHORTNESS OF BREATH  . tamsulosin (FLOMAX) 0.4 MG CAPS Take 0.4 mg by mouth daily.  . vitamin B-12 (CYANOCOBALAMIN) 100 MCG tablet Take 100 mcg by mouth daily.  Marland Kitchen  atenolol (TENORMIN) 100 MG tablet Take 100 mg by mouth daily (actually on 25mg /d per PCP)  . [DISCONTINUED] allopurinol (ZYLOPRIM) 100 MG tablet Take 50 mg by mouth daily.   . [DISCONTINUED] amoxicillin (AMOXIL) 500 MG capsule TAKE 4 CAPSULES BY MOUTH 1 HOUR PRIOR TO APPOINTMENT  . [DISCONTINUED] nystatin (MYCOSTATIN) 100000 UNIT/ML suspension Use as directed 100,000  Units in the mouth or throat 4 (four) times daily.   . [DISCONTINUED] potassium chloride SA (K-DUR,KLOR-CON) 20 MEQ tablet Take 20 mEq by mouth every Monday, Wednesday, and Friday.   . [DISCONTINUED] warfarin (COUMADIN) 5 MG tablet Take 5 mg by mouth daily. Take 5 mg daily except take 2.5 mg on mondays    Allergies  Allergen Reactions  . Amoxicillin     Severe thrush with crusting and ulceration of back of throat.  . Codeine Nausea Only  . Tramadol Nausea Only    Family History  Problem Relation Age of Onset  . Hypertension Mother   . Hyperlipidemia Mother   . Stroke Father   . Coronary artery disease Brother   . Hypertension Brother   . Hyperlipidemia Brother   . Heart attack Neg Hx     Social History   Social History  . Marital Status: Married    Spouse Name: Gerald Stabs  . Number of Children: 3  . Years of Education: 14   Occupational History  .     Social History Main Topics  . Smoking status: Former Smoker -- 1.00 packs/day for 15 years    Types: Cigarettes    Quit date: 09/20/1980  . Smokeless tobacco: Never Used  . Alcohol Use: 1.2 oz/week    2 Glasses of wine per week     Comment: 2 glasses of wine daily  . Drug Use: No  . Sexual Activity: Not on file   Other Topics Concern  . Not on file   Social History Narrative   Patient  consumes 2 cups of caffeine daily    Current Medications, Allergies, Past Medical History, Past Surgical History, Family History, and Social History were reviewed in [employer] record.   Review of Systems            All symptoms NEG except where BOLDED >>  Constitutional:  F/C/S, fatigue, anorexia, unexpected weight change. HEENT:  HA, visual changes, hearing loss, earache, nasal symptoms, sore throat, mouth sores, hoarseness. Resp:  cough, sputum, hemoptysis; SOB, tightness, wheezing. Cardio:  CP, palpit, DOE, orthopnea, edema. GI:  N/V/D/C, blood in stool; reflux, abd pain, distention, gas. GU:  dysuria,  freq, urgency, hematuria, flank pain, voiding difficulty. MS:  joint pain, swelling, tenderness, decr ROM; neck pain, back pain, etc. Neuro:  HA, tremors, seizures, dizziness, syncope, weakness, numbness, gait abn. Skin:  suspicious lesions or skin rash. Heme:  adenopathy, bruising, bleeding. Psyche:  confusion, agitation, sleep disturbance, hallucinations, anxiety, depression suicidal.   Objective:   Physical Exam      Vital Signs:  Reviewed...  General:  WD, WN, 81 y/o WM in NAD; alert & oriented; pleasant & cooperative, he is rather stoic... HEENT:  [place]/AT- scar on lip from surg; Conjunctiva- pink, Sclera- nonicteric, EOM-wnl, PERRLA, EACs-clear, TMs-wnl; NOSE-clear; THROAT-clear & wnl. Neck:  Supple w/ fair ROM; no JVD; normal carotid impulses w/o bruits; no thyromegaly or nodules palpated; no lymphadenopathy (hx left neck node dissection) Chest:  Bilat velcro rales ~1/2 way up the chest; no wheezing, rhonchi, or signs of consolidation... Heart:  Regular Rhythm; Gr 1/6 SEM w/o rubs or gallops detected... Abdomen:  Soft & nontender- no guarding or rebound; normal bowel sounds; no organomegaly or masses palpated. Ext:  Mild arthritic changes at age 51; no varicose veins, venous insuffic, or edema;  Pulses intact w/o bruits. Neuro:  No focal neuro deficitsl; sensory testing normal; gait normal & balance OK. Derm:  No lesions noted; no rash etc. Lymph:  No cervical, supraclavicular, axillary, or inguinal adenopathy palpated.   Assessment:      IMP >>     Dyspnea and cough due to his underlying interstitial lung disease    ILD> c/w ?UIP/ IPF vs poss fibrotic stage of NSIP w/ progressive pulmonary restriction over the last 10 yrs    OSA- on CPAP 9 w/ signif nocturnal hypoxemia not relieved w/ CPAP therapy     Pulmonary HTN -- VERY SEVERE by repeat 2DEcho    Dysphagia w/ laryngeal penetration & aspiration on MBS in 2008     HBP    CAD- s/p CABGx3 in 2005    Valvular heart dis- s/p  mitral & tricuspid annuloplasties; known mild AI & MR    Hx SSS, tachy-brady syndrome, s/p pacer w/ generator change 5/15    Permanent AFib, s/p cox-maze ablation yrs ago    Carotid artery disease    Hyperlipidemia     Hypothyroidism     BPH w/ BOO    DJD/ Gout    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  He also has pulmonary HTN suggested on prev 2DEcho w/ repeat Echo pending...  It has been pointed out to him before that his most important intervention would be institution of OXYGEN but he has refused several times over the last few yrs (this is likely because his wife has severe COPD & is on home oxygen as part of her treatment for this condition)...  He big agenda today was to get off the Atenolol since he read on the internet that it can cause fatigue & SOB- we stopped this & switched him to COREG 3.125Bid...  We also decided to place him on a short tapering sched of oral PREDNISONE while we are waiting on the HRCT & f/u 2DEcho... ROV 75mo I insisted that he start Oxygen at 2L/min [place] continuously & set him up an appt to see [facility] for his opinion (pt notes that MR sees pt's wife & is very familiar w/ his case)      Plan:     Patient's Medications  New Prescriptions   CARVEDILOL (COREG) 3.125 MG TABLET    Take 1 tablet (3.125 mg total) by mouth 2 (two) times daily with a meal.   PREDNISONE (DELTASONE) 20 MG TABLET    Use as directed  Previous  Medications   AMLODIPINE (NORVASC) 5 MG TABLET    TAKE 1 TABLET BY MOUTH DAILY.   APIXABAN (ELIQUIS) 2.5 MG TABS TABLET    Take 2.5 mg by mouth 2 (two) times daily.   ASPIRIN 81 MG TABLET    Take 81 mg by mouth daily.     BENAZEPRIL (LOTENSIN) 20 MG TABLET    Take 20 mg by mouth daily.   CHOLECALCIFEROL (VITAMIN D) 1000 UNITS TABLET    Take 1,000 Units by mouth daily.   FEBUXOSTAT (ULORIC) 40 MG TABLET    Take 40 mg by mouth daily.    FUROSEMIDE (LASIX) 20 MG TABLET    Take 1 tablet by mouth Daily.   LANSOPRAZOLE (PREVACID) 30 MG CAPSULE    Take 30 mg by mouth daily.     LEVOTHYROXINE (SYNTHROID, LEVOTHROID) 100 MCG TABLET    Take 100 mcg by mouth daily.     PROAIR HFA 108 (90 BASE) MCG/ACT INHALER    USE 2 PUFFS EVERY 4 HOURS AS NEEDED FOR WHEEZE, COUGH, SHORTNESS OF BREATH   TAMSULOSIN (FLOMAX) 0.4 MG CAPS    Take 0.4 mg by mouth daily.   VITAMIN B-12 (CYANOCOBALAMIN) 100 MCG TABLET    Take 100 mcg by mouth daily.  Modified Medications   No medications on file  Discontinued Medications   ALLOPURINOL (ZYLOPRIM) 100 MG TABLET    Take 50 mg by mouth daily.    AMOXICILLIN (AMOXIL) 500 MG CAPSULE    TAKE 4 CAPSULES BY MOUTH 1 HOUR PRIOR TO APPOINTMENT   ATENOLOL (TENORMIN) 100 MG TABLET    Take 100 mg by mouth daily.   NYSTATIN (MYCOSTATIN) 100000 UNIT/ML SUSPENSION    Use as directed 100,000 Units in the mouth or throat 4 (four) times daily.    POTASSIUM CHLORIDE SA (K-DUR,KLOR-CON) 20 MEQ TABLET    Take 20 mEq by mouth every Monday, Wednesday, and Friday.    WARFARIN (COUMADIN) 5 MG TABLET    Take 5 mg by mouth daily. Take 5 mg daily except take 2.5 mg on mondays

## 2014-10-10 ENCOUNTER — Ambulatory Visit (HOSPITAL_COMMUNITY): Payer: Medicare Other | Attending: Internal Medicine

## 2014-10-10 ENCOUNTER — Ambulatory Visit (INDEPENDENT_AMBULATORY_CARE_PROVIDER_SITE_OTHER)
Admission: RE | Admit: 2014-10-10 | Discharge: 2014-10-10 | Disposition: A | Discharge status: Home / Self Care | Payer: Medicare Other | Source: Ambulatory Visit | Attending: Pulmonary Disease | Admitting: Pulmonary Disease

## 2014-10-10 DIAGNOSIS — I272 Other secondary pulmonary hypertension: Secondary | ICD-10-CM | POA: Insufficient documentation

## 2014-10-10 DIAGNOSIS — I10 Essential (primary) hypertension: Secondary | ICD-10-CM | POA: Insufficient documentation

## 2014-10-10 DIAGNOSIS — I442 Atrioventricular block, complete: Secondary | ICD-10-CM | POA: Insufficient documentation

## 2014-10-10 DIAGNOSIS — J8410 Pulmonary fibrosis, unspecified: Secondary | ICD-10-CM | POA: Diagnosis not present

## 2014-10-10 DIAGNOSIS — I517 Cardiomegaly: Secondary | ICD-10-CM | POA: Insufficient documentation

## 2014-10-10 DIAGNOSIS — I071 Rheumatic tricuspid insufficiency: Secondary | ICD-10-CM | POA: Diagnosis not present

## 2014-10-10 DIAGNOSIS — I270 Primary pulmonary hypertension: Secondary | ICD-10-CM | POA: Diagnosis not present

## 2014-10-10 DIAGNOSIS — Z87891 Personal history of nicotine dependence: Secondary | ICD-10-CM | POA: Diagnosis not present

## 2014-10-10 DIAGNOSIS — E785 Hyperlipidemia, unspecified: Secondary | ICD-10-CM | POA: Diagnosis not present

## 2014-10-10 DIAGNOSIS — I340 Nonrheumatic mitral (valve) insufficiency: Secondary | ICD-10-CM | POA: Diagnosis not present

## 2014-10-10 DIAGNOSIS — I351 Nonrheumatic aortic (valve) insufficiency: Secondary | ICD-10-CM | POA: Diagnosis not present

## 2014-10-13 ENCOUNTER — Other Ambulatory Visit: Payer: Self-pay | Admitting: Pulmonary Disease

## 2014-10-13 DIAGNOSIS — J8410 Pulmonary fibrosis, unspecified: Secondary | ICD-10-CM

## 2014-10-30 ENCOUNTER — Other Ambulatory Visit: Payer: Self-pay

## 2014-10-30 ENCOUNTER — Ambulatory Visit (INDEPENDENT_AMBULATORY_CARE_PROVIDER_SITE_OTHER): Payer: Medicare Other | Admitting: Internal Medicine

## 2014-10-30 ENCOUNTER — Encounter: Payer: Self-pay | Admitting: Internal Medicine

## 2014-10-30 VITALS — BP 120/62 | HR 74 | Ht 68.0 in | Wt 153.0 lb

## 2014-10-30 DIAGNOSIS — J8410 Pulmonary fibrosis, unspecified: Secondary | ICD-10-CM | POA: Diagnosis not present

## 2014-10-30 LAB — RHEUMATOID FACTOR: RHEUMATOID FACTOR: 21 [IU]/mL — AB (ref <=14)

## 2014-10-30 NOTE — Patient Instructions (Addendum)
ICD-9-CM ICD-10-CM   1. INTERSTITIAL LUNG DISEASE 515 J84.10      - I am concerned you  have Interstitial Lung Disease (ILD)  -  There are > 100 varieties of this - To narrow down possibilities and assess severity please do the following tests  - do ACCP ILD questions  - do KBILD questions  - do autoimmune panel: Serum:  ANA, DS-DNA, RF, anti-CCP, scl-70,, Total CK,  Aldolase,  Hypersensitivity Pneumonitis Panel - read esbreit literature   -  Esbriet only slow down progression, 1 out of 6 patients  - this means extension in quality of life but no difference in symptoms  - no study directly compares the 2 drugs but efficacy roughly equal at 1 year time point - ESBRIET  - 3 pill three times daily, slow titration.  - Need to wean sunscreen  - Some chance of nausea and anorexia with small chance for diarrhea  - no known heart attack risk - no known bleeding risk,   - need monthly blood work for 6 months - monitor liver function - possible mortality benefit in pooled analysis  - larger world wide experience  Disclosure: Dr Chase Caller has participated in trials with Esbriet and has been compensated by Bluford Kaufmann / Celso Amy     Followup   - < 2-3 weeks tor review

## 2014-10-30 NOTE — Progress Notes (Signed)
Subjective:    Patient ID: [name] [name], male    DOB: [birth year], 81 y.o.   MRN: [id number]  HPI  OV 10/30/2014  Chief Complaint  Patient presents with  . Follow-up    Pt referred by [name]. Lenna [name] for ILD. pt states his breathing is improved since last OV. Pt denies cough and CP/tightness.    81 year old male transfer of care from WesleyNadel to [name] [name] myself on account of interstitial lung disease.  - The patient he has interstitial lung disease for 8 years. Says that he never has dyspnea but most recently in the last few to several months was having dyspnea on exertion that is of moderate severity. Apparently a few weeks ago [name]. Lenna [name] stopped his atenolol and put him on a 3 week prednisone taper. With this his dyspnea has improved. Currently he says he hardly feels any dyspnea or cough. He attributes all of dyspnea to atenolol. However he is aware that his interstitial lung disease. In 2008 on review of chart is autoimmune profile was negative. Most recently 10/10/2014 he had high resolution CT scan of the chest that was read by thoracic radiology and definitive diagnosis here is NSIP v  UIP. Honeycombing was not noted. The small groundglass opacity according to the radiologist. However in my opinion he has subtotal bibasal reticular pattern associated with honeycombing. Granted this a little more GGO  groundglass. This findings would be in my opinion consistent with UIP. However the CT is unchanged in several years which is a feature more common in NSIP but can occasionally be seen in UIP/IPF.Marland Kitchen   He did have spirometry on 10/02/2014 that shows FVC of 1.9 L/53%. He tells me that he is using oxygen at night. He is not interested in pulmonary rehabilitation. Overall his symptoms score document below shows very minimal symptoms. He is eating really good quality of life. This is t despite his multiple medical problems in the cardiopulmonary system.  He had echocardiogram 10/10/2014 that shows  slight reduction in left ventricular ejection fraction but poor echo windows. However he does have significant RV dysfunction associated with significant elevation in pulmonary to systolic pressure to 90 mmHg   [employer] of chest physicians interstitial lung disease questionnaire - He does not have any cough either in the daytime or nighttime. He only has mild shortness of breath when he is putting up and he reports his only for 3 weeks. -Personal exposure history he denies any smoking other than when he was 81 years of age she smoked a pack a day that he was age 89 -Family history of lung disease: He denies social environmental exposure history: He does not live in a old house. Denies using a humidifier, sound, hot tub, having birds, exposure to water damage, mold or animals - Occupational exposure history: He denies - Collagen-vascular disease history: He denies -Pulmonary medication toxicity history: He denies  K-BILD ILD QUESTIONNAIRE, Symptom score over prior 2 weeks  7-none, 6-rarely, 5-occ, 5-some times, 3-sev times, 2-most times, 1-every time 10/30/2014   Dyspnea for stairs, incline or hill 2  Chest Tightness 7  Worry about seriousness of lung complaint x  Avoided doing things that make you dyspneic 3  Have you felt loss of control of lung condition (reversed from original) 4  Felt fed up due to lung condition 7  Felt urge to breathe aka air hunger 5  Has lung condition made you feel anxious 7  How often have you experienced wheezing or whistling  sound 7  How much of the time have you felt your lung dz is getting worse 7  How much has your lung condition interfered with job or daily task 7  Were you expecting your lung condition to get worse 7  How much has your lung function limited you carrying things like groceris 4  How much has your lung function made you think of EOL? 5  Total   Are you financially worse off 7  Grand Total       has a past medical history of  Sleep apnea; Atrial fibrillation; SSS (sick sinus syndrome); Coronary artery disease; Hypertension; Hyperlipidemia; Pulmonary hypertension; History of interstitial lung disease; Diverticulosis; Esophageal dilatation; Hypothyroidism; Gout; Myocardial infarction; Pacemaker; SOB (shortness of breath) on exertion; GERD (gastroesophageal reflux disease); Arthritis; Allergy; CHF (congestive heart failure); Cancer (08/17/12 bx); and Carotid artery occlusion.   reports that he quit smoking about 34 years ago. His smoking use included Cigarettes. He has a 15 pack-year smoking history. He has never used smokeless tobacco.  Past Surgical History  Procedure Laterality Date  . Cardiac catheterization  08/08/2006    EF 50%  . Cardiac catheterization  07/26/2003    EF 40%  . Insert / replace / remove pacemaker      IMPLANT  . Coronary artery bypass graft  08/06/2003    3Vessel LIMA GRAFT TO THE LAD, SAPHENOUS VEIN GRAFT TO THE CIRCUMFLEX, SAPHENOUS VEIN GRAFT TO THE RIGHT POSTERIOR LATERAL BRANCH  . Mitral and tricuspid valve annuloplasty  08/06/2003    [name]. Roxy [name]  . Polypectomy    . Cataract extraction    . Cox-maze microwave ablation    . US echocardiography  07/12/2006    EF 50-55%  . US echocardiography  12/16/2003    EF 55-60%  . Cardiovascular stress test  07/11/2006    EF 50%  . Mass excision N/A 04/20/2012    Procedure: EXCISION OF LOWER LIP CANCER AND EXCISION SUBMENTAL LYMPH NODES;  Surgeon: [name] Nunnery, MD;  Location: [place];  Service: ENT;  Laterality: N/A;  . Radical neck dissection Left 08/17/2012    Procedure: LEFT NECK DISSECTION;  Surgeon: [name] Nunnery, MD;  Location: [place];  Service: ENT;  Laterality: Left;  . Cardiovascular stress test  06/17/2013    low risk scan, EF 51%  . Eye surgery    . Permanent pacemaker generator change N/A 06/13/2013    Procedure: PERMANENT PACEMAKER GENERATOR CHANGE;  Surgeon: [name] Sprang, MD;  Location: [facility] CATH LAB;  Service: Cardiovascular;   Laterality: N/A;    Allergies  Allergen Reactions  . Amoxicillin     Severe thrush with crusting and ulceration of back of throat.  . Codeine Nausea Only  . Tramadol Nausea Only    Immunization History  Administered Date(s) Administered  . Influenza Split 11/22/2010, 10/23/2014  . Influenza Whole 01/31/2009  . Pneumococcal-Unspecified 02/01/2012    Family History  Problem Relation Age of Onset  . Hypertension Mother   . Hyperlipidemia Mother   . Stroke Father   . Coronary artery disease Brother   . Hypertension Brother   . Hyperlipidemia Brother   . Heart attack Neg Hx      Current outpatient prescriptions:  .  amLODipine (NORVASC) 5 MG tablet, TAKE 1 TABLET BY MOUTH DAILY., Disp: 90 tablet, Rfl: 3 .  apixaban (ELIQUIS) 2.5 MG TABS tablet, Take 2.5 mg by mouth 2 (two) times daily., Disp: , Rfl:  .  aspirin 81 MG tablet,  Take 81 mg by mouth daily.  , Disp: , Rfl:  .  benazepril (LOTENSIN) 20 MG tablet, Take 20 mg by mouth daily., Disp: , Rfl:  .  carvedilol (COREG) 3.125 MG tablet, Take 1 tablet (3.125 mg total) by mouth 2 (two) times daily with a meal., Disp: 60 tablet, Rfl: 1 .  cholecalciferol (VITAMIN D) 1000 UNITS tablet, Take 1,000 Units by mouth daily., Disp: , Rfl:  .  febuxostat (ULORIC) 40 MG tablet, Take 40 mg by mouth daily. , Disp: , Rfl:  .  furosemide (LASIX) 20 MG tablet, Take 1 tablet by mouth daily as needed. , Disp: , Rfl:  .  lansoprazole (PREVACID) 30 MG capsule, Take 30 mg by mouth daily.  , Disp: , Rfl:  .  levothyroxine (SYNTHROID, LEVOTHROID) 100 MCG tablet, Take 100 mcg by mouth daily.  , Disp: , Rfl:  .  predniSONE (DELTASONE) 20 MG tablet, Use as directed, Disp: 25 tablet, Rfl: 0 .  PROAIR HFA 108 (90 BASE) MCG/ACT inhaler, USE 2 PUFFS EVERY 4 HOURS AS NEEDED FOR WHEEZE, COUGH, SHORTNESS OF BREATH, Disp: , Rfl: 5 .  tamsulosin (FLOMAX) 0.4 MG CAPS, Take 0.4 mg by mouth daily., Disp: , Rfl:  .  vitamin B-12 (CYANOCOBALAMIN) 100 MCG tablet, Take  100 mcg by mouth daily., Disp: , Rfl:     Review of Systems  - +ve for dyspnea, cougn - none    Objective:   Physical Exam  Constitutional: He is oriented to person, place, and time. He appears well-developed and well-nourished. No distress.  Body mass index is 23.27 kg/(m^2).   HENT:  Head: Normocephalic and atraumatic.  Right Ear: External ear normal.  Left Ear: External ear normal.  Mouth/Throat: Oropharynx is clear and moist. No oropharyngeal exudate.  Eyes: Conjunctivae and EOM are normal. Pupils are equal, round, and reactive to light. Right eye exhibits no discharge. Left eye exhibits no discharge. No scleral icterus.  Neck: Normal range of motion. Neck supple. No JVD present. No tracheal deviation present. No thyromegaly present.  Cardiovascular: Normal rate, regular rhythm and intact distal pulses.  Exam reveals no gallop and no friction rub.   No murmur heard. Pulmonary/Chest: Effort normal. No respiratory distress. He has no wheezes. He has rales. He exhibits no tenderness.  Abdominal: Soft. Bowel sounds are normal. He exhibits no distension and no mass. There is no tenderness. There is no rebound and no guarding.  Musculoskeletal: Normal range of motion. He exhibits no edema or tenderness.  Lymphadenopathy:    He has no cervical adenopathy.  Neurological: He is alert and oriented to person, place, and time. He has normal reflexes. No cranial nerve deficit. Coordination normal.  Skin: Skin is warm and dry. No rash noted. He is not diaphoretic. No erythema. No pallor.  Bruises on forearm  Psychiatric: He has a normal mood and affect. His behavior is normal. Judgment and thought content normal.  Nursing note and vitals reviewed.   Filed Vitals:   10/30/14 1054  BP: 120/62  Pulse: 74  Height: 5\' 8"  (1.727 m)  Weight: 153 lb (69.4 kg)  SpO2: 95%          Assessment & Plan:     ICD-9-CM ICD-10-CM   1. INTERSTITIAL LUNG DISEASE 51 J43.70   81 year old male  with interstitial lung disease not otherwise specified for many years by history. Clinically stable and with only mild symptom burden. CT chest September 2016 in my opinion is of UIP pattern. Based on  negative autoimmune profile and negative a CCP ILD questions I would think the pretest probably ready for idiopathic pulmonary fibrosis [IPF] is high. Nevertheless that is a small possibility that he has NSIP based on radiologic stability, minimal symptoms and thoracic radiologist challenging the diagnosis of UIP on CT chest.  In addition his pulmonary hypertension associated with cardiomyopathy  Despite all this is symptom burden is minimal  Plan  - He will definitely do better with pulmonary rehabilitation but given his minimum symptoms he declined this - He is not interested in right heart catheterization for pulmonary hypertension - Based on high clinical pretest probably give IPF we discussed disease modulating agent Pirfenidone disorder the progression of the disease. I gave him a brochure and type of instructions describing the actions of the drug. He is going to think about this but it appears that he might not be inclined  - In any event we will reassess his autoimmune profile in case he has autoimmune ILD because last time this was checked was in 2008   He is agreeable with the plan   > 50% of this > 25 min visit spent in face to face counseling or coordination of care    [name]. Brand Males, M.D., [facility].C.P Pulmonary and Critical Care Medicine Staff Physician [facility] Pager: [phone], If no answer or between  15:00h - 7:00h: call [phone]  10/30/2014 12:43 PM

## 2014-10-31 ENCOUNTER — Telehealth: Payer: Self-pay | Admitting: Pulmonary Disease

## 2014-10-31 LAB — ANTI-SCLERODERMA ANTIBODY: SCLERODERMA (SCL-70) (ENA) ANTIBODY, IGG: NEGATIVE

## 2014-10-31 LAB — CK TOTAL AND CKMB (NOT AT ARMC)
CK, MB: 1 ng/mL (ref 0.0–5.0)
Relative Index: 7.1 — ABNORMAL HIGH (ref 0.0–4.0)
Total CK: 14 U/L (ref 7–232)

## 2014-10-31 LAB — CYCLIC CITRUL PEPTIDE ANTIBODY, IGG: Cyclic Citrullin Peptide Ab: <16 Units

## 2014-10-31 LAB — ANA: Anti Nuclear Antibody(ANA): NEGATIVE

## 2014-10-31 LAB — ANTI-DNA ANTIBODY, DOUBLE-STRANDED: DS DNA AB: 7 [IU]/mL — AB

## 2014-10-31 NOTE — Telephone Encounter (Signed)
He has tachy-brady syndrome  - so tht is why he is on betablocker -. STop lotensin /benazerpril and then reasess. ANy problems despite this go to ER or call back

## 2014-10-31 NOTE — Telephone Encounter (Signed)
Called and spoke with pt Pt states severe diarrhea, weakness, extreme fatigue and feels of "passing out" x 1 week Pt feels that it is related to coreg that Dr Lenna Gilford prescribed at OV Pt was taken off atenolol and placed on coreg at this time Pt does not know what BP is, states he has no way of checking it  Will route message to Dr Romilda Garret as he has taken over care for pt and pt has requested this MR, please advise. Thanks

## 2014-10-31 NOTE — Telephone Encounter (Signed)
Called and spoke with pt and informed of MR's rec Pt voiced understanding of instructions  Nothing further is needed at this time

## 2014-11-01 LAB — ALDOLASE: Aldolase: 4.8 U/L (ref <=8.1)

## 2014-11-04 ENCOUNTER — Ambulatory Visit: Payer: Self-pay | Admitting: Pulmonary Disease

## 2014-11-11 LAB — HYPERSENSITIVITY PNUEMONITIS PROFILE

## 2014-11-27 ENCOUNTER — Ambulatory Visit (INDEPENDENT_AMBULATORY_CARE_PROVIDER_SITE_OTHER): Payer: Medicare Other | Admitting: Internal Medicine

## 2014-11-27 ENCOUNTER — Encounter: Payer: Self-pay | Admitting: Internal Medicine

## 2014-11-27 VITALS — BP 112/60 | HR 79 | Ht 68.0 in | Wt 152.0 lb

## 2014-11-27 DIAGNOSIS — J8410 Pulmonary fibrosis, unspecified: Secondary | ICD-10-CM

## 2014-11-27 DIAGNOSIS — J84112 Idiopathic pulmonary fibrosis: Secondary | ICD-10-CM

## 2014-11-27 NOTE — Patient Instructions (Addendum)
ICD-9-CM ICD-10-CM   1. INTERSTITIAL LUNG DISEASE 515 J84.10   2. IPF (idiopathic pulmonary fibrosis) (HCC) 516.31 J84.112    The specific variety of pulmonary fibrosis you have is called IPF - this is my clinical diagnosis. Most patients do not do well with this disease and this disease progresses over few to several years but some patients can be stable for many years Respect decision not to take [place] duty of fear of side effects especially GI side effects Referred to pulmonary rehabilitation which I appreciate you being  willing to do Glad you're up-to-date with her vaccines  Follow-up 6 months or sooner if needed  - At follow-up KBILD questionnaire and spirometry

## 2014-11-27 NOTE — Progress Notes (Signed)
Subjective:     Patient ID: [name] [name], male   DOB: [birth year], 81 y.o.   MRN: [id number]  HPI  OV 10/30/2014  Chief Complaint  Patient presents with  . Follow-up    Pt referred by Dr. Lenna Gilford for ILD. pt states his breathing is improved since last OV. Pt denies cough and CP/tightness.    81 year old male transfer of care from Dr.Nadel to Dr Sheran Luz myself on account of interstitial lung disease.  - The patient he has interstitial lung disease for 8 years. Says that he never has dyspnea but most recently in the last few to several months was having dyspnea on exertion that is of moderate severity. Apparently a few weeks ago Dr. Lenna Gilford stopped his atenolol and put him on a 3 week prednisone taper. With this his dyspnea has improved. Currently he says he hardly feels any dyspnea or cough. He attributes all of dyspnea to atenolol. However he is aware that his interstitial lung disease. In 2008 on review of chart is autoimmune profile was negative. Most recently 10/10/2014 he had high resolution CT scan of the chest that was read by thoracic radiology and definitive diagnosis here is NSIP v  UIP. Honeycombing was not noted. The small groundglass opacity according to the radiologist. However in my opinion he has subtotal bibasal reticular pattern associated with honeycombing. Granted this a little more GGO  groundglass. This findings would be in my opinion consistent with UIP. However the CT is unchanged in several years which is a feature more common in NSIP but can occasionally be seen in UIP/IPF.Marland Kitchen   He did have spirometry on 10/02/2014 that shows FVC of 1.9 L/53%. He tells me that he is using oxygen at night. He is not interested in pulmonary rehabilitation. Overall his symptoms score document below shows very minimal symptoms. He is eating really good quality of life. This is t despite his multiple medical problems in the cardiopulmonary system.  He had echocardiogram 10/10/2014 that shows slight  reduction in left ventricular ejection fraction but poor echo windows. However he does have significant RV dysfunction associated with significant elevation in pulmonary to systolic pressure to 90 mmHg   American College of chest physicians interstitial lung disease questionnaire - He does not have any cough either in the daytime or nighttime. He only has mild shortness of breath when he is putting up and he reports his only for 3 weeks. -Personal exposure history he denies any smoking other than when he was 81 years of age she smoked a pack a day that he was age 26 -Family history of lung disease: He denies social environmental exposure history: He does not live in a old house. Denies using a humidifier, sound, hot tub, having birds, exposure to water damage, mold or animals - Occupational exposure history: He denies - Collagen-vascular disease history: He denies -Pulmonary medication toxicity history: He denies    OV 11/27/2014  Chief Complaint  Patient presents with  . Follow-up    Pt states he does not want to move forward with Esbriet because of potential side effects. Pt states his breathing is unchanged. Pt c/o mild dry cough. Pt denies CP/tightness.    follow-up interstitial lung disease he did high clinical pretest probably ready for IPF  In the interim he had autoimmune profile which was negative. Hypersensitivity pneumonitis profile is also negative.. Made a clinical diagnosis of idiopathic pulmonary fibrosis [IPF]. Recommended ESbriet Rx. Hanley Seamen him a brochure to read. Today is to discuss  this. He is absolutely very categorical he does not want to do disease modulating therapy for pulmonary fibrosis. He says that primary care physician Jerlyn Ly, MD in the past is given him different medications for different reasons and all of them produce diarrhea. He is absolutely certain that Esbriet will do the same. We discussed that this would be seen only in a small portion of patients  and it is reversible. Nevertheless he does not even want to try it. We discussed the possibility that IPF is a progressive disease with survival of few to several years in most patients. He says he does not care about the prognosis but  he is willing to try pulmonary rehabilitation.  K-BILD ILD QUESTIONNAIRE, Symptom score over prior 2 weeks  7-none, 6-rarely, 5-occ, 5-some times, 3-sev times, 2-most times, 1-every time 10/30/2014   Dyspnea for stairs, incline or hill 2  Chest Tightness 7  Worry about seriousness of lung complaint x  Avoided doing things that make you dyspneic 3  Have you felt loss of control of lung condition (reversed from original) 4  Felt fed up due to lung condition 7  Felt urge to breathe aka air hunger 5  Has lung condition made you feel anxious 7  How often have you experienced wheezing or whistling sound 7  How much of the time have you felt your lung dz is getting worse 7  How much has your lung condition interfered with job or daily task 7  Were you expecting your lung condition to get worse 7  How much has your lung function limited you carrying things like groceris 4  How much has your lung function made you think of EOL? 5  Total   Are you financially worse off 7  Grand Total           Current outpatient prescriptions:  .  amLODipine (NORVASC) 5 MG tablet, TAKE 1 TABLET BY MOUTH DAILY., Disp: 90 tablet, Rfl: 3 .  apixaban (ELIQUIS) 2.5 MG TABS tablet, Take 2.5 mg by mouth 2 (two) times daily., Disp: , Rfl:  .  aspirin 81 MG tablet, Take 81 mg by mouth daily.  , Disp: , Rfl:  .  benazepril (LOTENSIN) 20 MG tablet, Take 20 mg by mouth daily., Disp: , Rfl:  .  cholecalciferol (VITAMIN D) 1000 UNITS tablet, Take 1,000 Units by mouth daily., Disp: , Rfl:  .  febuxostat (ULORIC) 40 MG tablet, Take 40 mg by mouth daily. , Disp: , Rfl:  .  furosemide (LASIX) 20 MG tablet, Take 1 tablet by mouth daily as needed. , Disp: , Rfl:  .  lansoprazole (PREVACID) 30  MG capsule, Take 30 mg by mouth daily.  , Disp: , Rfl:  .  levothyroxine (SYNTHROID, LEVOTHROID) 100 MCG tablet, Take 100 mcg by mouth daily.  , Disp: , Rfl:  .  predniSONE (DELTASONE) 20 MG tablet, Use as directed, Disp: 25 tablet, Rfl: 0 .  PROAIR HFA 108 (90 BASE) MCG/ACT inhaler, USE 2 PUFFS EVERY 4 HOURS AS NEEDED FOR WHEEZE, COUGH, SHORTNESS OF BREATH, Disp: , Rfl: 5 .  vitamin B-12 (CYANOCOBALAMIN) 100 MCG tablet, Take 100 mcg by mouth daily., Disp: , Rfl:     Review of Systems Positive for shortness of breath    Objective:   Physical Exam Filed Vitals:   11/27/14 1543  BP: 112/60  Pulse: 79  Height: 5\' 8"  (1.727 m)  Weight: 152 lb (68.947 kg)  SpO2: 90%    Crackles  of the base Body mass index is 23.12 kg/(m^2).      Assessment:       ICD-9-CM ICD-10-CM   1. INTERSTITIAL LUNG DISEASE 515 J84.10   2. IPF (idiopathic pulmonary fibrosis) ([place]) 516.31 J84.112         Plan:      He is absolutely very categorical he does not want to do disease modulating therapy for pulmonary fibrosis. He says that primary care physician Jerlyn Ly, MD in the past is given him different medications for different reasons and all of them produce diarrhea. He is absolutely certain that Esbriet will do the same. We discussed that this would be seen only in a small portion of patients and it is reversible. Nevertheless he does not even want to try it. We discussed the possibility that IPF is a progressive disease with survival of few to several years in most patients. He says he does not care about the prognosis but  he is willing to try pulmonary rehabilitation.   The specific variety of pulmonary fibrosis you have is called IPF - this is my clinical diagnosis. Most patients do not do well with this disease and this disease progresses over few to several years but some patients can be stable for many years Respect decision not to take [place] duty of fear of side effects especially GI side  effects Referred to pulmonary rehabilitation which I appreciate you being  willing to do Glad you're up-to-date with her vaccines  Follow-up 6 months or sooner if needed  - At follow-up KBILD questionnaire and spirometry   (> 50% of this 15 min visit spent in face to face counseling or/and coordination of care)   Dr. Brand Males, M.D., [facility].C.P Pulmonary and Critical Care Medicine Staff Physician [place] [facility] Pager: [phone], If no answer or between  15:00h - 7:00h: call [phone]  11/27/2014 4:19 PM

## 2014-12-01 ENCOUNTER — Ambulatory Visit (INDEPENDENT_AMBULATORY_CARE_PROVIDER_SITE_OTHER): Payer: Medicare Other | Admitting: *Deleted

## 2014-12-01 DIAGNOSIS — I442 Atrioventricular block, complete: Secondary | ICD-10-CM | POA: Diagnosis not present

## 2014-12-03 NOTE — Progress Notes (Signed)
Remote pacemaker transmission.   

## 2014-12-05 ENCOUNTER — Encounter: Payer: Self-pay | Admitting: Cardiology

## 2014-12-05 ENCOUNTER — Telehealth (HOSPITAL_COMMUNITY): Payer: Self-pay

## 2014-12-05 LAB — CUP PACEART REMOTE DEVICE CHECK
Battery Impedance: 159 Ohm
Battery Remaining Longevity: 141 mo
Battery Voltage: 2.78 V
Brady Statistic RV Percent Paced: 95 %
Implantable Lead Implant Date: 20050712
Implantable Lead Implant Date: 20050712
Implantable Lead Location: 753859
Implantable Lead Location: 753860
Implantable Lead Model: 4469
Implantable Lead Model: 4470
Implantable Lead Serial Number: 448060
Implantable Lead Serial Number: 480140
Lead Channel Impedance Value: 764 Ohm
Lead Channel Pacing Threshold Amplitude: 0.5 V
Lead Channel Setting Pacing Amplitude: 2.5 V
Lead Channel Setting Pacing Pulse Width: 0.4 ms
Lead Channel Setting Sensing Sensitivity: 2.8 mV
MDC IDC MSMT LEADCHNL RA IMPEDANCE VALUE: 67 Ohm
MDC IDC MSMT LEADCHNL RV PACING THRESHOLD PULSEWIDTH: 0.4 ms
MDC IDC SESS DTM: 20161101172854

## 2014-12-05 NOTE — Telephone Encounter (Signed)
Called patient to discuss Pulmonary Rehab referral from Dr. Chase Caller. Patient is responsible for a $40 copay each visit.  Offered patient our maintenance program instead which is $68.00 a month. Patient is interested. Patient was driving during call. Will follow up at a later time to discuss details of program.

## 2014-12-18 ENCOUNTER — Telehealth (HOSPITAL_COMMUNITY): Payer: Self-pay | Admitting: *Deleted

## 2014-12-23 ENCOUNTER — Encounter: Payer: Self-pay | Admitting: Family

## 2014-12-30 ENCOUNTER — Ambulatory Visit (HOSPITAL_COMMUNITY)
Admission: RE | Admit: 2014-12-30 | Discharge: 2014-12-30 | Disposition: A | Discharge status: Home / Self Care | Payer: Medicare Other | Source: Ambulatory Visit | Attending: Family | Admitting: Family

## 2014-12-30 ENCOUNTER — Encounter: Payer: Self-pay | Admitting: Family

## 2014-12-30 ENCOUNTER — Ambulatory Visit (INDEPENDENT_AMBULATORY_CARE_PROVIDER_SITE_OTHER): Payer: Medicare Other | Admitting: Family

## 2014-12-30 VITALS — BP 126/59 | HR 92 | Temp 98.1°F | Resp 20 | Ht 71.0 in | Wt 157.0 lb

## 2014-12-30 DIAGNOSIS — Z48812 Encounter for surgical aftercare following surgery on the circulatory system: Secondary | ICD-10-CM | POA: Diagnosis not present

## 2014-12-30 DIAGNOSIS — I6523 Occlusion and stenosis of bilateral carotid arteries: Secondary | ICD-10-CM

## 2014-12-30 NOTE — Progress Notes (Signed)
Chief Complaint: Carotid Artery Stenosis   History of Present Illness  AUTHOR SLAVEN is a 81 y.o. male patient of Dr. Kellie Simmering returns today for carotid artery surveillance.  He has no history of TIA or stroke symptoms.Specifically the patient denies a history of amaurosis fugax or monocular blindness, unilateral facial drooping, hemiplegia, or receptive or expressive aphasia.  He denies claudication symptoms with walking, denies non-healing wounds.  He reports New Medical or Surgical History: states he was told by his pulmonologist that he has more lung scarring, unknown etiology per pt.  He had a 3 vessel CABG in 2005, denies any history of MI.  Pt Diabetic: No Pt smoker: former smoker, quit 50 years ago  Pt meds include: Statin : No: is statin intolerant, caused myalgias  Betablocker: no ASA: Yes Other anticoagulants/antiplatelets: Eliquis    Past Medical History  Diagnosis Date  . Sleep apnea   . Atrial fibrillation ([place])   . SSS (sick sinus syndrome) ([place])   . Coronary artery disease   . Hypertension   . Hyperlipidemia   . Pulmonary hypertension ([place])   . History of interstitial lung disease   . Diverticulosis   . Esophageal dilatation   . Hypothyroidism   . Gout   . Myocardial infarction ([employer])   . Pacemaker     Medtronic  . SOB (shortness of breath) on exertion     sees dr. Lamonte Sakai occassionally  . GERD (gastroesophageal reflux disease)   . Arthritis     neck  . Allergy     codeine  . CHF (congestive heart failure) ([place])   . 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 Carotid artery occlusion     Social History Social History  Substance Use Topics  . Smoking status: Former Smoker -- 1.00 packs/day for 15 years    Types: Cigarettes    Quit date: 09/20/1980  . Smokeless tobacco: Never Used  . Alcohol Use: 1.2 oz/week    2 Glasses of wine per week     Comment: 2 glasses of wine daily    Family History Family History  Problem  Relation Age of Onset  . Hypertension Mother   . Hyperlipidemia Mother   . Stroke Father   . Coronary artery disease Brother   . Hypertension Brother   . Hyperlipidemia Brother   . Heart attack Neg Hx     Surgical History Past Surgical History  Procedure Laterality Date  . Cardiac catheterization  08/08/2006    EF 50%  . Cardiac catheterization  07/26/2003    EF 40%  . Insert / replace / remove pacemaker      IMPLANT  . Coronary artery bypass graft  08/06/2003    3Vessel LIMA GRAFT TO THE LAD, SAPHENOUS VEIN GRAFT TO THE CIRCUMFLEX, SAPHENOUS VEIN GRAFT TO THE RIGHT POSTERIOR LATERAL BRANCH  . Mitral and tricuspid valve annuloplasty  08/06/2003    Dr. Roxy Manns  . Polypectomy    . Cataract extraction    . Cox-maze microwave ablation    . US echocardiography  07/12/2006    EF 50-55%  . US echocardiography  12/16/2003    EF 55-60%  . Cardiovascular stress test  07/11/2006    EF 50%  . Mass excision N/A 04/20/2012    Procedure: EXCISION OF LOWER LIP CANCER AND EXCISION SUBMENTAL LYMPH NODES;  Surgeon: Rozetta Nunnery, MD;  Location: [facility];  Service: ENT;  Laterality: N/A;  . Radical neck dissection  Left 08/17/2012    Procedure: LEFT NECK DISSECTION;  Surgeon: Rozetta Nunnery, MD;  Location: [facility];  Service: ENT;  Laterality: Left;  . Cardiovascular stress test  06/17/2013    low risk scan, EF 51%  . Eye surgery    . Permanent pacemaker generator change N/A 06/13/2013    Procedure: PERMANENT PACEMAKER GENERATOR CHANGE;  Surgeon: Deboraha Sprang, MD;  Location: [facility] CATH LAB;  Service: Cardiovascular;  Laterality: N/A;    Allergies  Allergen Reactions  . Amoxicillin     Severe thrush with crusting and ulceration of back of throat.  . Codeine Nausea Only  . Tramadol Nausea Only    Current Outpatient Prescriptions  Medication Sig Dispense Refill  . amLODipine (NORVASC) 5 MG tablet TAKE 1 TABLET BY MOUTH DAILY. 90 tablet 3  . apixaban (ELIQUIS) 2.5 MG TABS tablet Take 2.5 mg by  mouth 2 (two) times daily.    Marland Kitchen aspirin 81 MG tablet Take 81 mg by mouth daily.      . benazepril (LOTENSIN) 20 MG tablet Take 20 mg by mouth daily.    . cholecalciferol (VITAMIN D) 1000 UNITS tablet Take 1,000 Units by mouth daily.    . febuxostat (ULORIC) 40 MG tablet Take 40 mg by mouth daily.     . furosemide (LASIX) 20 MG tablet Take 1 tablet by mouth daily as needed.     . lansoprazole (PREVACID) 30 MG capsule Take 30 mg by mouth daily.      Marland Kitchen levothyroxine (SYNTHROID, LEVOTHROID) 100 MCG tablet Take 100 mcg by mouth daily.      . predniSONE (DELTASONE) 20 MG tablet Use as directed 25 tablet 0  . PROAIR HFA 108 (90 BASE) MCG/ACT inhaler USE 2 PUFFS EVERY 4 HOURS AS NEEDED FOR WHEEZE, COUGH, SHORTNESS OF BREATH  5  . vitamin B-12 (CYANOCOBALAMIN) 100 MCG tablet Take 100 mcg by mouth daily.     No current facility-administered medications for this visit.    Review of Systems : See HPI for pertinent positives and negatives.  Physical Examination  Filed Vitals:   12/30/14 1340 12/30/14 1345  BP: 134/62 126/59  Pulse: 68 92  Temp: 98.1 F (36.7 C)   TempSrc: Oral   Resp: 20   Height: 5\' 11"  (1.803 m)   Weight: 157 lb (71.215 kg)   SpO2: 92%    Body mass index is 21.91 kg/(m^2).   General: WDWN male in NAD GAIT: normal Eyes: Pupils = Pulmonary: CTAB, no rales,  rhonchi, or wheezing.  Cardiac: regular rhythm, +murmur, pacemaker noted subcutaneous on right upper chest.  VASCULAR EXAM Carotid Bruits Left Right   Negative Negative   Aorta is not palpable. Radial pulses are 2+ palpable and equal.      LE Pulses LEFT RIGHT   POPLITEAL not palpable  not palpable  DP Not palpable palpable  PT palpable  palpable    Gastrointestinal: soft, nontender, BS WNL, no r/g, no palpable masses.  Musculoskeletal:  No muscle atrophy/wasting. M/S 5/5 throughout, Extremities without ischemic changes.  Neurologic: A&O X 3; Appropriate Affect, sensation is normal,  Speech is normal CN 2-12 intact, Pain and light touch intact in extremities, Motor exam as listed above.          Non-Invasive Vascular Imaging CAROTID DUPLEX 12/30/2014   CEREBROVASCULAR DUPLEX EVALUATION    INDICATION: Carotid artery disease     PREVIOUS INTERVENTION(S):     DUPLEX EXAM:     RIGHT  LEFT  Peak  Systolic Velocities (cm/s) End Diastolic Velocities (cm/s) Plaque LOCATION Peak Systolic Velocities (cm/s) End Diastolic Velocities (cm/s) Plaque  63 9  CCA PROXIMAL 68 11   71 9 CP CCA MID 77 14 CP  62 9 HT CCA DISTAL 68 12 HT  128 10 CP ECA 149 18 CP  86 11 CP ICA PROXIMAL 182 40 CP  79 12  ICA MID 136 16   48 9  ICA DISTAL 87 12     1.21 ICA / CCA Ratio (PSV) 2.36  Antegrade  Vertebral Flow Antegrade    Brachial Systolic Pressure (mmHg)   Multiphasic (Subclavian artery) Brachial Artery Waveforms Multiphasic (Subclavian artery)    Plaque Morphology:  HM = Homogeneous, HT = Heterogeneous, CP = Calcific Plaque, SP = Smooth Plaque, IP = Irregular Plaque     ADDITIONAL FINDINGS:     IMPRESSION: Right internal carotid artery velocities suggest a <40% stenosis.  Left internal carotid artery velocities suggest a 40-59% stenosis; however, densely calcified plaque may underestimate disease category.     Compared to the previous exam:  No significant change in comparison to the last exam on 12/24/2013.      Assessment: [name] [name] is a 81 y.o. male who has no history of stroke or TIA. He does have a history of a CABG in 2005, no MI. Today's carotid duplex suggests <40% right ICA stenosis and 40-59% left ICA stenosis. No significant change in comparison to the last exam on 12/24/2013.  Fortunately he does not have DM and he stopped smoking over 50 years ago.   Plan: Follow-up in 1 year with Carotid Duplex  scan.   I discussed in depth with the patient the nature of atherosclerosis, and emphasized the importance of maximal medical management including strict control of blood pressure, blood glucose, and lipid levels, obtaining regular exercise, and continued cessation of smoking.  The patient is aware that without maximal medical management the underlying atherosclerotic disease process will progress, limiting the benefit of any interventions. The patient was given information about stroke prevention and what symptoms should prompt the patient to seek immediate medical care. Thank you for allowing [place] to participate in this patient's care.  Clemon Chambers, RN, MSN, FNP-C [facility] Office: [phone]  Clinic Physician: Early  12/30/2014 1:21 PM

## 2014-12-30 NOTE — Patient Instructions (Signed)
Stroke Prevention Some medical conditions and behaviors are associated with an increased chance of having a stroke. You may prevent a stroke by making healthy choices and managing medical conditions. HOW CAN I REDUCE MY RISK OF HAVING A STROKE?   Stay physically active. Get at least 30 minutes of activity on most or all days.  Do not smoke. It may also be helpful to avoid exposure to secondhand smoke.  Limit alcohol use. Moderate alcohol use is considered to be:  No more than 2 drinks per day for men.  No more than 1 drink per day for nonpregnant women.  Eat healthy foods. This involves:  Eating 5 or more servings of fruits and vegetables a day.  Making dietary changes that address high blood pressure (hypertension), high cholesterol, diabetes, or obesity.  Manage your cholesterol levels.  Making food choices that are high in fiber and low in saturated fat, trans fat, and cholesterol may control cholesterol levels.  Take any prescribed medicines to control cholesterol as directed by your health care provider.  Manage your diabetes.  Controlling your carbohydrate and sugar intake is recommended to manage diabetes.  Take any prescribed medicines to control diabetes as directed by your health care provider.  Control your hypertension.  Making food choices that are low in salt (sodium), saturated fat, trans fat, and cholesterol is recommended to manage hypertension.  Ask your health care provider if you need treatment to lower your blood pressure. Take any prescribed medicines to control hypertension as directed by your health care provider.  If you are 18-39 years of age, have your blood pressure checked every 3-5 years. If you are 40 years of age or older, have your blood pressure checked every year.  Maintain a healthy weight.  Reducing calorie intake and making food choices that are low in sodium, saturated fat, trans fat, and cholesterol are recommended to manage  weight.  Stop drug abuse.  Avoid taking birth control pills.  Talk to your health care provider about the risks of taking birth control pills if you are over 35 years old, smoke, get migraines, or have ever had a blood clot.  Get evaluated for sleep disorders (sleep apnea).  Talk to your health care provider about getting a sleep evaluation if you snore a lot or have excessive sleepiness.  Take medicines only as directed by your health care provider.  For some people, aspirin or blood thinners (anticoagulants) are helpful in reducing the risk of forming abnormal blood clots that can lead to stroke. If you have the irregular heart rhythm of atrial fibrillation, you should be on a blood thinner unless there is a good reason you cannot take them.  Understand all your medicine instructions.  Make sure that other conditions (such as anemia or atherosclerosis) are addressed. SEEK IMMEDIATE MEDICAL CARE IF:   You have sudden weakness or numbness of the face, arm, or leg, especially on one side of the body.  Your face or eyelid droops to one side.  You have sudden confusion.  You have trouble speaking (aphasia) or understanding.  You have sudden trouble seeing in one or both eyes.  You have sudden trouble walking.  You have dizziness.  You have a loss of balance or coordination.  You have a sudden, severe headache with no known cause.  You have new chest pain or an irregular heartbeat. Any of these symptoms may represent a serious problem that is an emergency. Do not wait to see if the symptoms will   go away. Get medical help at once. Call your local emergency services (911 in [place] Do not drive yourself to the hospital.   This information is not intended to replace advice given to you by your health care provider. Make sure you discuss any questions you have with your health care provider.   Document Released: 02/25/2004 Document Revised: 02/07/2014 Document Reviewed:  07/20/2012 Elsevier Interactive Patient Education 2016 Elsevier Inc.  

## 2015-01-02 ENCOUNTER — Emergency Department (HOSPITAL_COMMUNITY)
Admission: EM | Admit: 2015-01-02 | Discharge: 2015-01-02 | Disposition: A | Discharge status: Home / Self Care | Payer: Medicare Other | Source: Home / Self Care

## 2015-01-02 ENCOUNTER — Encounter (HOSPITAL_COMMUNITY): Payer: Self-pay | Admitting: Emergency Medicine

## 2015-01-02 DIAGNOSIS — S51011A Laceration without foreign body of right elbow, initial encounter: Secondary | ICD-10-CM

## 2015-01-02 NOTE — ED Notes (Signed)
Applied non-adhesive dressing and covered with bulky gauze/paper tape

## 2015-01-02 NOTE — ED Provider Notes (Signed)
CSN: [id number]     Arrival date & time 01/02/15  1355 History   None    Chief Complaint  Patient presents with  . Extremity Laceration   (Consider location/radiation/quality/duration/timing/severity/associated sxs/prior Treatment) HPI Right lateral upper forearm with skin tear due to bandage removal. Injury was about 1 week ago, no active bleeding. No pain. Past Medical History  Diagnosis Date  . Sleep apnea   . Atrial fibrillation ([place])   . SSS (sick sinus syndrome) ([place])   . Coronary artery disease   . Hypertension   . Hyperlipidemia   . Pulmonary hypertension ([place])   . History of interstitial lung disease   . Diverticulosis   . Esophageal dilatation   . Hypothyroidism   . Gout   . Myocardial infarction ([place])   . Pacemaker     Medtronic  . SOB (shortness of breath) on exertion     sees dr. Lamonte Sakai occassionally  . GERD (gastroesophageal reflux disease)   . Arthritis     neck  . Allergy     codeine  . CHF (congestive heart failure) ([place])   . 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 Carotid artery occlusion    Past Surgical History  Procedure Laterality Date  . Cardiac catheterization  08/08/2006    EF 50%  . Cardiac catheterization  07/26/2003    EF 40%  . Insert / replace / remove pacemaker      IMPLANT  . Coronary artery bypass graft  08/06/2003    3Vessel LIMA GRAFT TO THE LAD, SAPHENOUS VEIN GRAFT TO THE CIRCUMFLEX, SAPHENOUS VEIN GRAFT TO THE RIGHT POSTERIOR LATERAL BRANCH  . Mitral and tricuspid valve annuloplasty  08/06/2003    Dr. Roxy Manns  . Polypectomy    . Cataract extraction    . Cox-maze microwave ablation    . US echocardiography  07/12/2006    EF 50-55%  . US echocardiography  12/16/2003    EF 55-60%  . Cardiovascular stress test  07/11/2006    EF 50%  . Mass excision N/A 04/20/2012    Procedure: EXCISION OF LOWER LIP CANCER AND EXCISION SUBMENTAL LYMPH NODES;  Surgeon: Rozetta Nunnery, MD;  Location: [facility];  Service: ENT;  Laterality:  N/A;  . Radical neck dissection Left 08/17/2012    Procedure: LEFT NECK DISSECTION;  Surgeon: Rozetta Nunnery, MD;  Location: [facility];  Service: ENT;  Laterality: Left;  . Cardiovascular stress test  06/17/2013    low risk scan, EF 51%  . Eye surgery    . Permanent pacemaker generator change N/A 06/13/2013    Procedure: PERMANENT PACEMAKER GENERATOR CHANGE;  Surgeon: Deboraha Sprang, MD;  Location: [facility] CATH LAB;  Service: Cardiovascular;  Laterality: N/A;   Family History  Problem Relation Age of Onset  . Hypertension Mother   . Hyperlipidemia Mother   . Stroke Father   . Coronary artery disease Brother   . Hypertension Brother   . Hyperlipidemia Brother   . Heart attack Neg Hx    Social History  Substance Use Topics  . Smoking status: Former Smoker -- 1.00 packs/day for 15 years    Types: Cigarettes    Quit date: 09/20/1980  . Smokeless tobacco: Never Used  . Alcohol Use: 1.2 oz/week    2 Glasses of wine per week     Comment: 2 glasses of wine daily    Review of Systems +'ve skin tear Denies: active bleeding, other trauma Allergies  Amoxicillin; Codeine; and Tramadol  Home Medications   Prior to Admission medications   Medication Sig Start Date End Date Taking? Authorizing Provider  apixaban (ELIQUIS) 2.5 MG TABS tablet Take 2.5 mg by mouth 2 (two) times daily.   Yes Historical Provider, MD  amLODipine (NORVASC) 5 MG tablet TAKE 1 TABLET BY MOUTH DAILY. 01/01/13   Peter M Martinique, MD  aspirin 81 MG tablet Take 81 mg by mouth daily.      Historical Provider, MD  benazepril (LOTENSIN) 20 MG tablet Take 20 mg by mouth daily.    Historical Provider, MD  cholecalciferol (VITAMIN D) 1000 UNITS tablet Take 1,000 Units by mouth daily.    Historical Provider, MD  febuxostat (ULORIC) 40 MG tablet Take 40 mg by mouth daily.     Historical Provider, MD  furosemide (LASIX) 20 MG tablet Take 1 tablet by mouth daily as needed.  07/07/10   Historical Provider, MD  lansoprazole (PREVACID) 30  MG capsule Take 30 mg by mouth daily.      Historical Provider, MD  levothyroxine (SYNTHROID, LEVOTHROID) 100 MCG tablet Take 100 mcg by mouth daily.      Historical Provider, MD  predniSONE (DELTASONE) 20 MG tablet Use as directed Patient not taking: Reported on 12/30/2014 10/02/14   Noralee Space, MD  PROAIR HFA 108 (90 BASE) MCG/ACT inhaler USE 2 PUFFS EVERY 4 HOURS AS NEEDED FOR WHEEZE, COUGH, SHORTNESS OF BREATH 09/26/14   Historical Provider, MD  vitamin B-12 (CYANOCOBALAMIN) 100 MCG tablet Take 100 mcg by mouth daily.    Historical Provider, MD   Meds Ordered and Administered this Visit  Medications - No data to display  BP 147/73 mmHg  Pulse 79  Temp(Src) 97.5 F (36.4 C) (Oral)  Resp 18  SpO2 93% No data found.   Physical Exam  Constitutional: He is oriented to person, place, and time. He appears well-developed and well-nourished.  HENT:  Head: Normocephalic and atraumatic.  Pulmonary/Chest: Effort normal.  Musculoskeletal:       Arms: Neurological: He is alert and oriented to person, place, and time.    ED Course  Procedures (including critical care time)  Labs Review Labs Reviewed - No data to display  Imaging Review No results found.   Visual Acuity Review  Right Eye Distance:   Left Eye Distance:   Bilateral Distance:    Right Eye Near:   Left Eye Near:    Bilateral Near:         MDM   1. Skin tear of elbow without complication, right, initial encounter     Nonadherent dressing is applied to the wound with antibiotic ointment. Advised to follow with primary care provider to no worsening symptoms or signs of infection.  THIS NOTE WAS GENERATED USING A VOICE RECOGNITION SOFTWARE PROGRAM. ALL REASONABLE EFFORTS  WERE MADE TO PROOFREAD THIS DOCUMENT FOR ACCURACY.   Konrad Felix, [place] 01/02/15 [phone]

## 2015-01-02 NOTE — Discharge Instructions (Signed)
Skin Tear Care °A skin tear is when the top layer of skin peels off. To repair the skin, your doctor may use:  °· Tape. °· Skin adhesive strips. °HOME CARE °· Change bandages (dressings) once a day or as told by your doctor. °¨ Gently clean the area with salt (saline) solution or with a mild soap and water. °¨ Do not rub the injured skin dry. Let the area air dry. °¨ Put petroleum jelly or antibiotic cream on the tear. Do not allow a scab to form. °¨ If the bandage sticks, moisten it with warm soapy water and remove it. °· Protect the injured skin until it has healed. °· Only take medicine as told by your doctor. °· Take showers or baths using warm soapy water. Apply a new bandage after the shower or bath. °· Keep all doctor visits as told. °GET HELP RIGHT AWAY IF:  °· You have redness, puffiness (swelling), or more pain in the tear. °· You have a yellowish-white fluid (pus) coming from the tear. °· You have chills. °· You have a red streak that goes away from the tear. °· You have a bad smell coming from the tear or bandage. °· You have a fever or lasting symptoms for more than 2-3 days. °· You have a fever and your symptoms suddenly get worse. °MAKE SURE YOU:  °· Understand these instructions. °· Will watch this condition. °· Will get help right away if you are not doing well or get worse. °  °This information is not intended to replace advice given to you by your health care provider. Make sure you discuss any questions you have with your health care provider. °  °Document Released: 10/27/2007 Document Revised: 10/12/2011 Document Reviewed: 08/01/2011 °Elsevier Interactive Patient Education ©2016 Elsevier Inc. ° °

## 2015-01-02 NOTE — ED Notes (Signed)
Pt here with right lateral arm skin tear after pulling bandage off x 1 week ago Pt is taking anticoagulant BID for CHF Dressing removed with no active bleeding,  Denies pain or swelling

## 2015-01-12 ENCOUNTER — Telehealth: Payer: Self-pay | Admitting: Internal Medicine

## 2015-01-12 DIAGNOSIS — J8410 Pulmonary fibrosis, unspecified: Secondary | ICD-10-CM

## 2015-01-12 NOTE — Telephone Encounter (Signed)
Spoke with pt. States that he would like to have a POC. Advised him that we will place an order for this. He agreed. Nothing further was needed at this time.

## 2015-01-15 ENCOUNTER — Telehealth: Payer: Self-pay | Admitting: Internal Medicine

## 2015-01-15 DIAGNOSIS — R0600 Dyspnea, unspecified: Secondary | ICD-10-CM

## 2015-01-15 NOTE — Telephone Encounter (Signed)
Spoke with [name] [name] from [place]. Pt is currently using 2L at night time. He is wanting to have an ONO to make sure he is getting adequate oxygen.  MR - please advise. Thanks.

## 2015-01-16 NOTE — Telephone Encounter (Signed)
Spoke with Maudie Mercury at [facility], aware that order is being placed and will make Angela aware. Order placed for ONO.  Nothing further needed.

## 2015-01-16 NOTE — Telephone Encounter (Signed)
Ok to do an ONO on 2L o2. Will not get back with results till atleast jan 7th

## 2015-01-20 ENCOUNTER — Telehealth: Payer: Self-pay | Admitting: Internal Medicine

## 2015-01-20 NOTE — Telephone Encounter (Signed)
Called spoke with pt. He reports he is not happy with [facility]. He reports they came out and did eval for his POC about 3-4 days ago and has not heard from anyone. He called them earlier and received answering service. I advised will call [facility]. He then reported he wants a POC not the cylinders.  Called spoke with Jeani Hawking. She reports they tested pt and was not able to tolerate pulse dose on POC. They did a titration on him and he maintained sats above 88% on 4 liters cont flow (not pulsed setting).   Called pt back and received fast busy signal. [facility]

## 2015-01-21 NOTE — Telephone Encounter (Signed)
ATC, fast busy signal

## 2015-01-22 NOTE — Telephone Encounter (Signed)
Spoke with pt, relayed the below recs from [place] to him.  Pt states "this is wrong, he uses his wife's POC on 4lpm continuous all the time" and wants one for himself.  I advised that according to [facility] he does not qualify for a POC, which is why he isn't receiving one.  Pt states he will call [facility] to straighten this out.  Nothing further needed at this time.

## 2015-01-26 ENCOUNTER — Other Ambulatory Visit: Payer: Self-pay | Admitting: Pulmonary Disease

## 2015-01-28 ENCOUNTER — Telehealth: Payer: Self-pay | Admitting: Internal Medicine

## 2015-01-28 NOTE — Telephone Encounter (Signed)
Spoke with pt, c/o stuffy nose qhs X2 days.  Pt states s/s not present during the day, but only when laying down.   Denies cough, fever, headache, PND.  Has not taken anything to help with s/s.   Pt uses [facility] on [address]    Sending to doc of the day as MR is unavailable today.  MW please advise on recs.  Thanks!

## 2015-01-28 NOTE — Telephone Encounter (Signed)
Called spoke with pt. Aware of recs below. He verbalized understanding and needed nothing further

## 2015-01-28 NOTE — Telephone Encounter (Signed)
Try afrin 12h at hs x 5 days only then needs to see his primary or [place]

## 2015-01-29 ENCOUNTER — Telehealth: Payer: Self-pay | Admitting: Internal Medicine

## 2015-01-29 NOTE — Telephone Encounter (Signed)
1. Pt decided not take afrin as suggested yesterday d/t listed side effects. Wanted to make [place] aware. 2. Pt wanted the proper spelling of his diagnosis.  This was provided to him. Nothing further needed.

## 2015-01-30 ENCOUNTER — Telehealth: Payer: Self-pay | Admitting: Internal Medicine

## 2015-01-30 NOTE — Telephone Encounter (Signed)
Called and spoke with pt. Pt would like to have his sons added to the list of individuals that MR is allowed to discuss his medical conditions with. I explained to him that he would have to come into the office and sign a form to have them added. He stated that he would like to be soon there 05/2015 with MR. I explained to him that he has no availability until 04/2015. I scheduled pt to see TP on 02/16/15 at 9:45am. Pt voiced understanding and had no further questions .Nothing further needed.

## 2015-02-06 ENCOUNTER — Telehealth: Payer: Self-pay | Admitting: Internal Medicine

## 2015-02-06 NOTE — Telephone Encounter (Signed)
Received fax from [place] that came from [place] [place]. Pt is requesting to change DME companies from [facility] to [facility].   MR please advise. Thanks.

## 2015-02-10 NOTE — Telephone Encounter (Signed)
Ok to change to [employer]

## 2015-02-11 NOTE — Telephone Encounter (Signed)
Daneil Dan please advise when this is completed.  Thanks!

## 2015-02-16 ENCOUNTER — Encounter: Payer: Self-pay | Admitting: Adult Health

## 2015-02-16 ENCOUNTER — Ambulatory Visit (INDEPENDENT_AMBULATORY_CARE_PROVIDER_SITE_OTHER): Payer: Medicare Other | Admitting: Adult Health

## 2015-02-16 ENCOUNTER — Telehealth: Payer: Self-pay | Admitting: Adult Health

## 2015-02-16 VITALS — BP 126/72 | HR 62 | Temp 97.7°F | Ht 70.0 in | Wt 161.0 lb

## 2015-02-16 DIAGNOSIS — I272 Other secondary pulmonary hypertension: Secondary | ICD-10-CM | POA: Diagnosis not present

## 2015-02-16 DIAGNOSIS — J84112 Idiopathic pulmonary fibrosis: Secondary | ICD-10-CM

## 2015-02-16 NOTE — Telephone Encounter (Signed)
Daneil Dan is not in this week. Rodena Piety, were you given form back?

## 2015-02-16 NOTE — Patient Instructions (Addendum)
Continue on Oxygen 3l/m at rest and 4l/m activity.  Activity as tolerated.  Pulmonary rehab when they are ready .  Follow up Dr. Chase Caller in 3-4 months and As needed

## 2015-02-16 NOTE — Telephone Encounter (Signed)
I don't have the CMN folder and I am not sure where it is. I guess MR has it with him. I told Daneil Dan on Thursday that the form should have been in the CMN folder and ask MR to at least to sign that one. As of today the folder has not been returned to me.

## 2015-02-16 NOTE — Telephone Encounter (Signed)
lmtcb x1 for pt's son. 

## 2015-02-16 NOTE — Assessment & Plan Note (Addendum)
Continue on Oxygen  Cont on CPAP At bedtime

## 2015-02-16 NOTE — Assessment & Plan Note (Signed)
Presumed IPF ,  Declines OFEV and Esbriet.  Cont w/ O2 .  Hopefully can get in to pulmonary rehab soon.

## 2015-02-16 NOTE — Progress Notes (Signed)
Subjective:    Patient ID: [name] [name], male    DOB: [birth year], 82 y.o.   MRN: [id number]  HPI 82 yo male with ILD   TEST   PFT 2008 showed normal airflow, normal lung volumes, but decreased DLCO; Note- no desat noted after 3 laps in the office on RA...  CT Chest 2008 showed increased subpleural interstitial opacities w/ LL predom- sl progressed from 2005 scan, felt to be a UIP pattern.  Collagen-vasc labs were neg  Ba swallow 2008 showed some laryngeal penetration & aspiration, improved w/ antireflux measures, chin tuck and pureed foods  Spirometry 5/11 showed FVC=2.83 (71%), FEV1=2.22 (78%), %1sec=78; and ambulatory O2sats were still wnl- no desat noted  Spirometry 10/02/14> FVC=1.9 (52%), FEV1=1.7 (66%), %1sec=88, mid-flows wnl; This is c/o mod severe restrictive ventilatory defect, no obstructive dis evident... Hi-res CT Chest done 10/10/14 & showed mild cardiomeg, s/p CABG/ annulopasties/ maze procedure/ & right sided pacer, atherosclerotic dis in Ao/ great vessels/ all 3 coronoaries, +mediatinal adenopathy up to 45mm size; ILD w/ ground glass attenuation in lungs bilat w/ extensive subpleural & peribronchovasc reticulation & thickening of the interstitium w/ mild craniocaudal gradient, mild traction bronchiectasis, mild honeycombing and only mild progression compared to 2012; No lung nodules or masses => Although there are some features that could be suggestive of usual interstitial pneumonia (UIP), the minimal progression compared to the prior study, presence of extensive ground-glass attenuation, minimal honeycombing and a lack of a strong craniocaudal gradient may suggest an alternative diagnosis of 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 2DEcho 10/10/14 showed mild LVH, mild AI/ MR/ and LAdil; RV is severely dilated w/ severely reduced RVF, mod TR, severe RAdil, and PAsys=45mmHg...   02/16/2015 Follow up : ILD  Pt returns for a 3 month follow up  He  has ILD (presumed IPF)  with associated severe pulmonary HTN on chronic Oxygen at 3l/m rest and 4l/m walking  Workup w/ neg autoimmune. Panel.  Pt continues to have DOE with minimal activity. Feels his dyspnea is getting worse over the past year.  Declines spirometry today .   We discussed antifibrotic with Esbriet and OFEV , he cont to decline .  Spirometry over last 5+ years has declined with Restrictive dz , most recent FVC at 52%.  Echo showed severe PAH . And cor pulmonale .  On Eliquis daily . Hx of atrial fib.  Waiting to hear from pulmonary rehab on list .   He denies hemoptysis, chest pain, orthopnea, or increased edema.   Past Medical History  Diagnosis Date  . Sleep apnea   . Atrial fibrillation ([place])   . SSS (sick sinus syndrome) ([place])   . Coronary artery disease   . Hypertension   . Hyperlipidemia   . Pulmonary hypertension ([place])   . History of interstitial lung disease   . Diverticulosis   . Esophageal dilatation   . Hypothyroidism   . Gout   . Myocardial infarction ([place])   . Pacemaker     Medtronic  . SOB (shortness of breath) on exertion     sees dr. Lamonte Sakai occassionally  . GERD (gastroesophageal reflux disease)   . Arthritis     neck  . Allergy     codeine  . CHF (congestive heart failure) ([place])   . 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 Carotid artery occlusion    Current Outpatient Prescriptions on File Prior to Visit  Medication Sig Dispense Refill  .  amLODipine (NORVASC) 5 MG tablet TAKE 1 TABLET BY MOUTH DAILY. 90 tablet 3  . apixaban (ELIQUIS) 2.5 MG TABS tablet Take 2.5 mg by mouth 2 (two) times daily.    Marland Kitchen aspirin 81 MG tablet Take 81 mg by mouth daily.      . benazepril (LOTENSIN) 20 MG tablet Take 20 mg by mouth daily.    . carvedilol (COREG) 3.125 MG tablet TAKE 1 TABLET (3.125 MG TOTAL) BY MOUTH 2 (TWO) TIMES DAILY WITH A MEAL. 60 tablet 1  . cholecalciferol (VITAMIN D) 1000 UNITS tablet Take 1,000 Units by mouth daily.    .  febuxostat (ULORIC) 40 MG tablet Take 40 mg by mouth daily.     . furosemide (LASIX) 20 MG tablet Take 1 tablet by mouth daily as needed.     . lansoprazole (PREVACID) 30 MG capsule Take 30 mg by mouth daily.      Marland Kitchen levothyroxine (SYNTHROID, LEVOTHROID) 100 MCG tablet Take 100 mcg by mouth daily.      Marland Kitchen PROAIR HFA 108 (90 BASE) MCG/ACT inhaler USE 2 PUFFS EVERY 4 HOURS AS NEEDED FOR WHEEZE, COUGH, SHORTNESS OF BREATH  5  . vitamin B-12 (CYANOCOBALAMIN) 100 MCG tablet Take 100 mcg by mouth daily.     No current facility-administered medications on file prior to visit.      Review of Systems Constitutional:   No  weight loss, night sweats,  Fevers, chills,  +fatigue, or  lassitude.  HEENT:   No headaches,  Difficulty swallowing,  Tooth/dental problems, or  Sore throat,                No sneezing, itching, ear ache, nasal congestion, post nasal drip,   CV:  No chest pain,  Orthopnea, PND,  anasarca, dizziness, palpitations, syncope.   GI  No heartburn, indigestion, abdominal pain, nausea, vomiting, diarrhea, change in bowel habits, loss of appetite, bloody stools.   Resp:    No chest wall deformity  Skin: no rash or lesions.  GU: no dysuria, change in color of urine, no urgency or frequency.  No flank pain, no hematuria   MS:  No joint pain or swelling.  No decreased range of motion.  No back pain.  Psych:  No change in mood or affect. No depression or anxiety.  No memory loss.          Objective:   Physical Exam Filed Vitals:   02/16/15 0946  BP: 126/72  Pulse: 62  Temp: 97.7 F (36.5 C)  TempSrc: Oral  Height: 5\' 10"  (1.778 m)  Weight: 161 lb (73.029 kg)  SpO2: 93%   Body mass index is 23.1 kg/(m^2).   GEN: A/Ox3; pleasant , NAD, frail and elderly   HEENT:  [place]/AT,  EACs-clear, TMs-wnl, NOSE-clear, THROAT-clear, no lesions, no postnasal drip or exudate noted.   NECK:  Supple w/ fair ROM; no JVD; normal carotid impulses w/o bruits; no thyromegaly or nodules  palpated; no lymphadenopathy.  RESP  BB crackles no accessory muscle use, no dullness to percussion  CARD:  RRR, no m/r/g  , tr -1 peripheral edema, pulses intact, no cyanosis or clubbing.  GI:   Soft & nt; nml bowel sounds; no organomegaly or masses detected.  Musco: Warm bil, no deformities or joint swelling noted.   Neuro: alert, no focal deficits noted.    Skin: Warm, no lesions or rashes         Assessment & Plan:

## 2015-02-17 NOTE — Telephone Encounter (Signed)
Spoke with son w/ updates

## 2015-02-17 NOTE — Telephone Encounter (Signed)
Spoke with pt's son Ronalee Belts who states that TP had called pt yesterday to relay "new info discovered after pt's ov". I see no documentation of this call or an addendum to pt's ov note. TP please advise.  Thanks!

## 2015-02-17 NOTE — Telephone Encounter (Signed)
I just got MR CMN folder back and this CMN is not in it but I do remember giving it to [facility]

## 2015-02-17 NOTE — Telephone Encounter (Signed)
No new info, just spoke with pt regarding looking back at all his test to give a better synopsis .  Do not see number to call him ???

## 2015-02-17 NOTE — Telephone Encounter (Signed)
Pt's number listed in chart is [phone] Please advise.  Thanks!

## 2015-02-18 NOTE — Telephone Encounter (Signed)
I had to call Wells Guiles with [employer] to fax another copy of the CMN to MR to sign. I have it and have put the CMN in MR's CMN folder waiting for him to come into the office again.

## 2015-02-25 ENCOUNTER — Ambulatory Visit (INDEPENDENT_AMBULATORY_CARE_PROVIDER_SITE_OTHER): Payer: Medicare Other | Admitting: Internal Medicine

## 2015-02-25 ENCOUNTER — Encounter: Payer: Self-pay | Admitting: Internal Medicine

## 2015-02-25 ENCOUNTER — Telehealth: Payer: Self-pay | Admitting: Adult Health

## 2015-02-25 VITALS — BP 148/74 | HR 67 | Temp 96.7°F | Ht 71.0 in | Wt 164.2 lb

## 2015-02-25 DIAGNOSIS — R059 Cough, unspecified: Secondary | ICD-10-CM

## 2015-02-25 DIAGNOSIS — R05 Cough: Secondary | ICD-10-CM | POA: Diagnosis not present

## 2015-02-25 DIAGNOSIS — K5900 Constipation, unspecified: Secondary | ICD-10-CM

## 2015-02-25 DIAGNOSIS — I10 Essential (primary) hypertension: Secondary | ICD-10-CM

## 2015-02-25 DIAGNOSIS — J9611 Chronic respiratory failure with hypoxia: Secondary | ICD-10-CM

## 2015-02-25 DIAGNOSIS — K5909 Other constipation: Secondary | ICD-10-CM

## 2015-02-25 MED ORDER — NEBIVOLOL HCL 5 MG PO TABS: 5.0000 mg | ORAL_TABLET | Freq: Every day | ORAL | Status: DC

## 2015-02-25 MED ORDER — VALSARTAN 160 MG PO TABS: 160.0000 mg | ORAL_TABLET | Freq: Every day | ORAL | Status: DC

## 2015-02-25 NOTE — Telephone Encounter (Signed)
Spoke with the pt  He is c/o increased edema "all over"- esp stomach and legs  His breathing is no better since last visit  Seems confused about meds  I scheduled him appt with MW for 3:15 today and advised to bring all meds with him to this visit

## 2015-02-25 NOTE — Patient Instructions (Addendum)
Stop lotensin and replace with valsartan 160 mg daily   Stop norvasc (constipation/ leg swelling)   Start bystolic 5 mg daily in 2 weeks then see Dr Joylene Draft with all meds in hand to  regroup   Prevacid 30 mg Take 30-60 min Take 30-60 min before first meal of the day and add pepcid ac 20 mg daily   GERD (REFLUX)  is an extremely common cause of respiratory symptoms just like yours , many times with no obvious heartburn at all.    It can be treated with medication, but also with lifestyle changes including elevation of the head of your bed (ideally with 6 inch  bed blocks),  Smoking cessation, avoidance of late meals, excessive alcohol, and avoid fatty foods, chocolate, peppermint, colas, red wine, and acidic juices such as orange juice.  NO MINT OR MENTHOL PRODUCTS SO NO COUGH DROPS  USE SUGARLESS CANDY INSTEAD (Jolley ranchers or Stover's or Life Savers) or even ice chips will also do - the key is to swallow to prevent all throat clearing. NO OIL BASED VITAMINS - use powdered substitutes.    Avoid foods you know cause gas:  Beans, boiled eggs/ undercooked vegetables and spinach   Continue on Oxygen 3l/m at rest and 4l/m activity.  Activity as tolerated.  Pulmonary rehab when they are ready .    Keep appts with Dr Joylene Draft and Chase Caller

## 2015-02-25 NOTE — Progress Notes (Addendum)
Subjective:    Patient ID: [name] [name], male    DOB: [birth year] .   MRN: [id number]  HPI 62 yowm quit smoking in 1980s  with ILD   TEST   PFT 2008 showed normal airflow, normal lung volumes, but decreased DLCO; Note- no desat noted after 3 laps in the office on RA...  CT Chest 2008 showed increased subpleural interstitial opacities w/ LL predom- sl progressed from 2005 scan, felt to be a UIP pattern.  Collagen-vasc labs were neg  Ba swallow 2008 showed some laryngeal penetration & aspiration, improved w/ antireflux measures, chin tuck and pureed foods  Spirometry 5/11 showed FVC=2.83 (71%), FEV1=2.22 (78%), %1sec=78; and ambulatory O2sats were still wnl- no desat noted  Spirometry 10/02/14> FVC=1.9 (52%), FEV1=1.7 (66%), %1sec=88, mid-flows wnl; This is c/o mod severe restrictive ventilatory defect, no obstructive dis evident... Hi-res CT Chest done 10/10/14 & showed mild cardiomeg, s/p CABG/ annulopasties/ maze procedure/ & right sided pacer, atherosclerotic dis in Ao/ great vessels/ all 3 coronoaries, +mediatinal adenopathy up to 73mm size; ILD w/ ground glass attenuation in lungs bilat w/ extensive subpleural & peribronchovasc reticulation & thickening of the interstitium w/ mild craniocaudal gradient, mild traction bronchiectasis, mild honeycombing and only mild progression compared to 2012; No lung nodules or masses => Although there are some features that could be suggestive of usual interstitial pneumonia (UIP), the minimal progression compared to the prior study, presence of extensive ground-glass attenuation, minimal honeycombing and a lack of a strong craniocaudal gradient may suggest an alternative diagnosis of 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 2DEcho 10/10/14 showed mild LVH, mild AI/ MR/ and LAdil; RV is severely dilated w/ severely reduced RVF, mod TR, severe RAdil, and PAsys=48mmHg...   02/16/2015 Follow up : ILD  Pt returns for a 3 month  follow up  He has ILD (presumed IPF)  with associated severe pulmonary HTN on chronic Oxygen at 3l/m rest and 4l/m walking  Workup w/ neg autoimmune. Panel.  Pt continues to have DOE with minimal activity. Feels his dyspnea is getting worse over the past year.   We discussed antifibrotic with Esbriet and OFEV , he cont to decline .  Spirometry over last 5+ years has declined with Restrictive dz , most recent FVC at 52%.  Echo showed severe PAH . And cor pulmonale .  On Eliquis daily . Hx of atrial fib.  Waiting to hear from pulmonary rehab on list  rec Continue on Oxygen 3l/m at rest and 4l/m activity.  Activity as tolerated.  Pulmonary rehab when they are ready .    02/25/2015  Acute extended  ov/[place] re: pf with refractory cough /chronic hypoxemic RF / worse constipation Chief Complaint  Patient presents with  . Follow-up    patient complaining of SOB, no chest tightness, gaining weight, not eating, stomach swelling, constipated, has not used bathroom in 5 days  gradual onset x weeks worse dry hacking cough early on insp/ mod hoarseness/ mild sense of dysphagia/ sore throat but denies excess/ purulent sputum or mucus plugs    No obvious day to day or daytime variability or assoc cp or chest tightness, subjective wheeze or overt sinus or hb symptoms. No unusual exp hx or h/o childhood pna/ asthma or knowledge of premature birth.  Sleeping ok without nocturnal  or early am exacerbation  of respiratory  c/o's or need for noct saba. Also denies any obvious fluctuation of symptoms with weather or environmental changes or other aggravating or alleviating factors except  as outlined above   Current Medications, Allergies, Complete Past Medical History, Past Surgical History, Family History, and Social History were reviewed in [employer] record.  ROS  The following are not active complaints unless bolded sore throat, dysphagia, dental problems, itching, sneezing,  nasal  congestion or excess/ purulent secretions, ear ache,   fever, chills, sweats, unintended wt loss, classically pleuritic or exertional cp, hemoptysis,  orthopnea pnd or leg swelling, presyncope, palpitations, abdominal pain, anorexia, nausea, vomiting, diarrhea  or change in bowel or bladder habits(more constipated) change in stools or urine, dysuria,hematuria,  rash, arthralgias, visual complaints, headache, numbness, weakness or ataxia or problems with walking or coordination,  change in mood/affect or memory.              Objective:   Physical Exam    GEN: A/Ox3; pleasant , NAD, frail and elderly    Wt Readings from Last 3 Encounters:  02/25/15 164 lb 3.2 oz (74.481 kg)  02/16/15 161 lb (73.029 kg)  12/30/14 157 lb (71.215 kg)    Vital signs reviewed   HEENT:  [place]/AT,  EACs-clear, TMs-wnl, NOSE-clear, THROAT-clear, no lesions, no postnasal drip or exudate noted.   NECK:  Supple w/ fair ROM; no JVD; normal carotid impulses w/o bruits; no thyromegaly or nodules palpated; no lymphadenopathy.  RESP  BB crackles no accessory muscle use, no dullness to percussion/ cough early on insp  CARD:  RRR, no m/r/g  , tr -1 bilateral lower ex edema, pulses intact, no cyanosis or clubbing.  GI:   Mod distended with decreaesed bs/ no focal tenderness or rebound, hyper resonant to percussion ; no organomegaly or masses detected.  Musco: Warm bil, no deformities or joint swelling noted.   Neuro: alert, no focal deficits noted.    Skin: Warm, no lesions or rashes         Assessment & Plan:

## 2015-02-26 ENCOUNTER — Encounter: Payer: Self-pay | Admitting: Internal Medicine

## 2015-02-26 DIAGNOSIS — K5909 Other constipation: Secondary | ICD-10-CM | POA: Insufficient documentation

## 2015-02-26 NOTE — Assessment & Plan Note (Signed)
rx as of 02/25/2015 = 3lpm at rest/ 4lpm with exertion  This may help his air hunger and tendency to aerophagia also

## 2015-02-26 NOTE — Assessment & Plan Note (Signed)
Trial off acei due to cough 02/25/2015  Trial off amlodipine due to leg swelling/ constipation 02/25/2015    In the best review of chronic cough to date ( NEJM 2016 375 [id number]) ,  ACEi are now felt to cause cough in up to  20% of pts which is a 4 fold increase from previous reports and does not include the variety of non-specific complaints we see in pulmonary clinic in pts on ACEi but previously attributed to another dx like  Copd/asthma and  include PNDS, throat and chest congestion, "bronchitis", unexplained dyspnea and noct "strangling" sensations, and hoarseness, but also  atypical /refractory GERD symptoms like dysphagia and "bad heartburn"   The only way I know  to prove this is not an "ACEi Case" is a trial off ACEi x a minimum of 6 weeks then regroup.   Try bystolic 5 mg x 2 weeks and change lisinopril to diovan 160 mg daily indefinitely to avoid confusion re interpretation of chronic non-specific symptoms.

## 2015-02-26 NOTE — Assessment & Plan Note (Addendum)
The cough early on insp and assoc with sore throat and hoarseness are not typical of PF and more likely  Classic Upper airway cough syndrome, so named because it's frequently impossible to sort out how much is  CR/sinusitis with freq throat clearing (which can be related to primary GERD)   vs  causing  secondary (" extra esophageal")  GERD from wide swings in gastric pressure that occur with throat clearing, often  promoting self use of mint and menthol lozenges that reduce the lower esophageal sphincter tone and exacerbate the problem further in a cyclical fashion.   These are the same pts (now being labeled as having "irritable larynx syndrome" by some cough centers) who not infrequently have a history of having failed to tolerate ace inhibitors,  dry powder inhalers or biphosphonates or report having atypical reflux symptoms that don't respond to standard doses of PPI , and are easily confused as having aecopd or asthma flares by even experienced allergists/ pulmonologists.   Fist step is try off acei/ max gerd rx then regroup with Drs Alvera Singh  I had an extended discussion with the patient reviewing all relevant studies completed to date and  lasting 25 minutes of a 40 minute acute extended office visit    Each maintenance medication was reviewed in detail including most importantly the difference between maintenance and prns and under what circumstances the prns are to be triggered using an action plan format that is not reflected in the computer generated alphabetically organized AVS.    Please see instructions for details which were reviewed in writing and the patient given a copy highlighting the part that I personally wrote and discussed at today's ov.

## 2015-02-26 NOTE — Assessment & Plan Note (Signed)
Assoc with abd distention which is mild and may be aggravated by aerophagia and chronic use of CCB's  rec try off CCB/ rx with otc's advised and f/u with Dr Joylene Draft planned

## 2015-03-02 ENCOUNTER — Other Ambulatory Visit: Payer: Self-pay | Admitting: Internal Medicine

## 2015-03-02 ENCOUNTER — Telehealth: Payer: Self-pay | Admitting: Cardiology

## 2015-03-02 ENCOUNTER — Encounter: Payer: Medicare Other | Admitting: *Deleted

## 2015-03-02 DIAGNOSIS — K5900 Constipation, unspecified: Secondary | ICD-10-CM

## 2015-03-02 NOTE — Telephone Encounter (Signed)
LMOVM reminding pt to send remote transmission.   

## 2015-03-04 ENCOUNTER — Encounter: Payer: Self-pay | Admitting: Cardiology

## 2015-03-06 ENCOUNTER — Other Ambulatory Visit: Payer: Self-pay

## 2015-03-13 ENCOUNTER — Ambulatory Visit
Admission: RE | Admit: 2015-03-13 | Discharge: 2015-03-13 | Disposition: A | Discharge status: Home / Self Care | Payer: Medicare Other | Source: Ambulatory Visit | Attending: Internal Medicine | Admitting: Internal Medicine

## 2015-03-13 DIAGNOSIS — K5900 Constipation, unspecified: Secondary | ICD-10-CM

## 2015-03-17 ENCOUNTER — Telehealth: Payer: Self-pay | Admitting: Internal Medicine

## 2015-03-17 DIAGNOSIS — J9611 Chronic respiratory failure with hypoxia: Secondary | ICD-10-CM

## 2015-03-17 NOTE — Telephone Encounter (Signed)
Last ov 1.25.17 with MW: Patient Instructions       Stop lotensin and replace with valsartan 160 mg daily   Stop norvasc (constipation/ leg swelling)   Start bystolic 5 mg daily in 2 weeks then see Dr Joylene Draft with all meds in hand to  regroup   Prevacid 30 mg Take 30-60 min Take 30-60 min before first meal of the day and add pepcid ac 20 mg daily   GERD (REFLUX)  is an extremely common cause of respiratory symptoms just like yours , many times with no obvious heartburn at all.    It can be treated with medication, but also with lifestyle changes including elevation of the head of your bed (ideally with 6 inch  bed blocks),  Smoking cessation, avoidance of late meals, excessive alcohol, and avoid fatty foods, chocolate, peppermint, colas, red wine, and acidic juices such as orange juice.   NO MINT OR MENTHOL PRODUCTS SO NO COUGH DROPS  USE SUGARLESS CANDY INSTEAD (Jolley ranchers or Stover's or Life Savers) or even ice chips will also do - the key is to swallow to prevent all throat clearing. NO OIL BASED VITAMINS - use powdered substitutes.    Avoid foods you know cause gas:  Beans, boiled eggs/ undercooked vegetables and spinach   Continue on Oxygen 3l/m at rest and 4l/m activity.   Activity as tolerated.   Pulmonary rehab when they are ready .    Keep appts with Dr Joylene Draft and Henrietta Hoover spoke with patient's spouse Gerald Stabs who reported that pt would like to (1) switch from [facility] to [facility] and (2) would like to switch to liquid O2.  Pt's chart has that he wears 3lpm continuously but spouse reported that pt wears 4lpm.  MR please advise if okay to eval pt for liquid O2 and liter flow.  Thank you.

## 2015-03-18 NOTE — Telephone Encounter (Signed)
Order sent to [facility] for pt

## 2015-03-18 NOTE — Telephone Encounter (Signed)
Ok to change to [facility] and ok to try test for liquid o2

## 2015-03-19 NOTE — Telephone Encounter (Signed)
Called spoke with [name] [name] and made aware. Order was sent to [facility]. Nothing further needed

## 2015-03-20 ENCOUNTER — Encounter: Payer: Self-pay | Admitting: Physician Assistant

## 2015-03-20 NOTE — Progress Notes (Signed)
    Cardiology Office Note   Date:  03/20/2015   ID:  KEONNE COPPING, DOB [birth year], MRN [id number]  PCP:  Jerlyn Ly, MD  Cardiologist:  Dr Suzie Portela, PA-C   No chief complaint on file.   History of Present Illness: [name] [name] is a 82 y.o. male with a history of CABG 2005 w/ mitral and tricuspid rings, MDT PPM, HTN, HLD, Afib on Eliquis    This encounter was created in error - please disregard.

## 2015-03-23 ENCOUNTER — Telehealth: Payer: Self-pay | Admitting: Internal Medicine

## 2015-03-23 NOTE — Telephone Encounter (Signed)
Called and spoke with Jeani Hawking at [facility]. She states that Maudie Mercury is out of the office until 03/25/15. I explained to her that we received a call from her stating the patient wanted an order for liquid oxygen. She stated that the patient decided to switch to [facility] and to disregard the message. Nothing further needed at this time.

## 2015-03-23 NOTE — Telephone Encounter (Signed)
Regular walk fine but walk him fast after being on RA for 30 min

## 2015-03-23 NOTE — Telephone Encounter (Signed)
Called spoke with pt. He is scheduled for walk test tomorrow at 3:30. Called mandy and to make aware and LMTCB x1

## 2015-03-23 NOTE — Telephone Encounter (Signed)
Called spoke with [place] from [place]. Pt is switching from [facility]. They were only billing his insurance for night time O2 and was never billed for portable 02 that pt has. He will need be brought in for qualifying sats on RA if pt wants to switch over to [facility]. Last documented SATS in epic was 10/02/14 and pt only dropped to 89% on RA. MR, do you prefer a regular walk on pt or 6MW? thanks

## 2015-03-24 ENCOUNTER — Ambulatory Visit (INDEPENDENT_AMBULATORY_CARE_PROVIDER_SITE_OTHER): Payer: Medicare Other | Admitting: Internal Medicine

## 2015-03-24 DIAGNOSIS — J9611 Chronic respiratory failure with hypoxia: Secondary | ICD-10-CM

## 2015-03-24 NOTE — Telephone Encounter (Signed)
mandy returned call - [phone]

## 2015-03-24 NOTE — Telephone Encounter (Signed)
lmtcb X2 for [place] with [facility]

## 2015-03-24 NOTE — Telephone Encounter (Signed)
Called spoke with [place] from [place]. She is aware. Nothing further needed

## 2015-03-24 NOTE — Progress Notes (Signed)
Pt came in for qualifying walk. He did desaturate on RA. Can see Patient Care Coordination Note for details.   Reviewed and noted  Dr. Brand Males, M.D., [facility].C.P Pulmonary and Critical Care Medicine Staff Physician [facility] Pager: [phone], If no answer or between  15:00h - 7:00h: call [phone]  04/29/2015 10:12 AM

## 2015-03-25 ENCOUNTER — Encounter: Payer: Self-pay | Admitting: *Deleted

## 2015-04-16 ENCOUNTER — Emergency Department (HOSPITAL_COMMUNITY): Payer: Medicare Other

## 2015-04-16 ENCOUNTER — Emergency Department (HOSPITAL_COMMUNITY)
Admission: EM | Admit: 2015-04-16 | Discharge: 2015-04-16 | Disposition: A | Discharge status: Home / Self Care | Payer: Medicare Other | Attending: Emergency Medicine | Admitting: Emergency Medicine

## 2015-04-16 ENCOUNTER — Encounter (HOSPITAL_COMMUNITY): Payer: Self-pay

## 2015-04-16 DIAGNOSIS — S0191XA Laceration without foreign body of unspecified part of head, initial encounter: Secondary | ICD-10-CM | POA: Insufficient documentation

## 2015-04-16 DIAGNOSIS — I252 Old myocardial infarction: Secondary | ICD-10-CM | POA: Diagnosis not present

## 2015-04-16 DIAGNOSIS — M1990 Unspecified osteoarthritis, unspecified site: Secondary | ICD-10-CM | POA: Diagnosis not present

## 2015-04-16 DIAGNOSIS — S022XXA Fracture of nasal bones, initial encounter for closed fracture: Secondary | ICD-10-CM | POA: Diagnosis not present

## 2015-04-16 DIAGNOSIS — S2232XA Fracture of one rib, left side, initial encounter for closed fracture: Secondary | ICD-10-CM | POA: Diagnosis not present

## 2015-04-16 DIAGNOSIS — M109 Gout, unspecified: Secondary | ICD-10-CM | POA: Insufficient documentation

## 2015-04-16 DIAGNOSIS — W010XXA Fall on same level from slipping, tripping and stumbling without subsequent striking against object, initial encounter: Secondary | ICD-10-CM | POA: Insufficient documentation

## 2015-04-16 DIAGNOSIS — I2510 Atherosclerotic heart disease of native coronary artery without angina pectoris: Secondary | ICD-10-CM | POA: Diagnosis not present

## 2015-04-16 DIAGNOSIS — Y9389 Activity, other specified: Secondary | ICD-10-CM | POA: Insufficient documentation

## 2015-04-16 DIAGNOSIS — I509 Heart failure, unspecified: Secondary | ICD-10-CM | POA: Diagnosis not present

## 2015-04-16 DIAGNOSIS — R188 Other ascites: Secondary | ICD-10-CM | POA: Insufficient documentation

## 2015-04-16 DIAGNOSIS — I10 Essential (primary) hypertension: Secondary | ICD-10-CM | POA: Insufficient documentation

## 2015-04-16 DIAGNOSIS — Y9289 Other specified places as the place of occurrence of the external cause: Secondary | ICD-10-CM | POA: Insufficient documentation

## 2015-04-16 DIAGNOSIS — W19XXXA Unspecified fall, initial encounter: Secondary | ICD-10-CM

## 2015-04-16 DIAGNOSIS — S299XXA Unspecified injury of thorax, initial encounter: Secondary | ICD-10-CM | POA: Diagnosis present

## 2015-04-16 DIAGNOSIS — Z79899 Other long term (current) drug therapy: Secondary | ICD-10-CM | POA: Diagnosis not present

## 2015-04-16 DIAGNOSIS — Y998 Other external cause status: Secondary | ICD-10-CM | POA: Insufficient documentation

## 2015-04-16 DIAGNOSIS — Z7901 Long term (current) use of anticoagulants: Secondary | ICD-10-CM | POA: Insufficient documentation

## 2015-04-16 DIAGNOSIS — E039 Hypothyroidism, unspecified: Secondary | ICD-10-CM | POA: Insufficient documentation

## 2015-04-16 DIAGNOSIS — Z7982 Long term (current) use of aspirin: Secondary | ICD-10-CM | POA: Insufficient documentation

## 2015-04-16 LAB — I-STAT CHEM 8, ED
BUN: 27 mg/dL — ABNORMAL HIGH (ref 6–20)
Calcium, Ion: 1.07 mmol/L — ABNORMAL LOW (ref 1.13–1.30)
Chloride: 97 mmol/L — ABNORMAL LOW (ref 101–111)
Creatinine, Ser: 1.4 mg/dL — ABNORMAL HIGH (ref 0.61–1.24)
Glucose, Bld: 87 mg/dL (ref 65–99)
HCT: 44 % (ref 39.0–52.0)
Hemoglobin: 15 g/dL (ref 13.0–17.0)
POTASSIUM: 4.1 mmol/L (ref 3.5–5.1)
SODIUM: 137 mmol/L (ref 135–145)
TCO2: 28 mmol/L (ref 0–100)

## 2015-04-16 LAB — CBC WITH DIFFERENTIAL/PLATELET
BASOS ABS: 0 10*3/uL (ref 0.0–0.1)
BASOS PCT: 0 %
EOS ABS: 0.1 10*3/uL (ref 0.0–0.7)
EOS PCT: 1 %
HCT: 38.1 % — ABNORMAL LOW (ref 39.0–52.0)
Hemoglobin: 12.7 g/dL — ABNORMAL LOW (ref 13.0–17.0)
Lymphocytes Relative: 13 %
Lymphs Abs: 1.1 10*3/uL (ref 0.7–4.0)
MCH: 31.9 pg (ref 26.0–34.0)
MCHC: 33.3 g/dL (ref 30.0–36.0)
MCV: 95.7 fL (ref 78.0–100.0)
Monocytes Absolute: 0.7 10*3/uL (ref 0.1–1.0)
Monocytes Relative: 8 %
Neutro Abs: 6 10*3/uL (ref 1.7–7.7)
Neutrophils Relative %: 78 %
PLATELETS: 264 10*3/uL (ref 150–400)
RBC: 3.98 MIL/uL — AB (ref 4.22–5.81)
RDW: 16.5 % — ABNORMAL HIGH (ref 11.5–15.5)
WBC: 7.8 10*3/uL (ref 4.0–10.5)

## 2015-04-16 MED ORDER — IOHEXOL 300 MG/ML  SOLN: 80.0000 mL | Freq: Once | INTRAMUSCULAR | Status: DC | PRN

## 2015-04-16 MED ORDER — HYDROCODONE-ACETAMINOPHEN 5-325 MG PO TABS
1.0000 | ORAL_TABLET | ORAL | Status: DC | PRN
Start: 2015-04-16 — End: 2015-08-31

## 2015-04-16 MED ORDER — TETANUS-DIPHTH-ACELL PERTUSSIS 5-2.5-18.5 LF-MCG/0.5 IM SUSP
0.5000 mL | Freq: Once | INTRAMUSCULAR | Status: DC
  Filled 2015-04-16: qty 0.5

## 2015-04-16 NOTE — Discharge Instructions (Signed)
Nasal Fracture A nasal fracture is a break or crack in the bones or cartilage of the nose. Minor breaks do not require treatment. These breaks usually heal on their own after about one month. Serious breaks may require surgery. CAUSES This injury is usually caused by a blunt injury to the nose. This type of injury often occurs from:  Contact sports.  Car accidents.  Falls.  Getting punched. SYMPTOMS Symptoms of this injury include:  Pain.  Swelling of the nose.  Bleeding from the nose.  Bruising around the nose or eyes. This may include having black eyes.  Crooked appearance of the nose. DIAGNOSIS This injury may be diagnosed with a physical exam. The health care provider will gently feel the nose for signs of broken bones. He or she will look inside the nostrils to make sure that there is not a blood-filled swelling on the dividing wall between the nostrils (septal hematoma). X-rays of the nose may not show a nasal fracture even when one is present. In some cases, X-rays or a CT scan may be done 1-5 days after the injury. Sometimes, the health care provider will want to wait until the swelling has gone down. TREATMENT Often, minor fractures that have caused no deformity do not require treatment. More serious fractures in which bones have moved out of position may require surgery, which will take place after the swelling is gone. Surgery will stabilize and align the fracture. In some cases, a health care provider may be able to reposition the bones without surgery. This may be done in the health care provider's office after medicine is given to numb the area (local anesthetic). HOME CARE INSTRUCTIONS  If directed, apply ice to the injured area:  Put ice in a plastic bag.  Place a towel between your skin and the bag.  Leave the ice on for 20 minutes, 2-3 times per day.  Take over-the-counter and prescription medicines only as told by your health care provider.  If your nose  starts to bleed, sit in an upright position while you squeeze the soft parts of your nose against the dividing wall between your nostrils (septum) for 10 minutes.  Try to avoid blowing your nose.  Return to your normal activities as told by your health care provider. Ask your health care provider what activities are safe for you.  Avoid contact sports for 3-4 weeks or as told by your health care provider.  Keep all follow-up visits as told by your health care provider. This is important. SEEK MEDICAL CARE IF:  Your pain increases or becomes severe.  You continue to have nosebleeds.  The shape of your nose does not return to normal within 5 days.  You have pus draining out of your nose. SEEK IMMEDIATE MEDICAL CARE IF:  You have bleeding from your nose that does not stop after you pinch your nostrils closed for 20 minutes and keep ice on your nose.  You have clear fluid draining out of your nose.  You notice a grape-like swelling on the septum. This swelling is a collection of blood (hematoma) that must be drained to help prevent infection.  You have difficulty moving your eyes.  You have repeated vomiting.   This information is not intended to replace advice given to you by your health care provider. Make sure you discuss any questions you have with your health care provider.   Document Released: 01/15/2000 Document Revised: 10/08/2014 Document Reviewed: 02/24/2014 Elsevier Interactive Patient Education [employer].  Rib Fracture A rib fracture is a break or crack in one of the bones of the ribs. The ribs are a group of long, curved bones that wrap around your chest and attach to your spine. They protect your lungs and other organs in the chest cavity. A broken or cracked rib is often painful, but most do not cause other problems. Most rib fractures heal on their own over time. However, rib fractures can be more serious if multiple ribs are broken or if broken ribs move out  of place and push against other structures. CAUSES   A direct blow to the chest. For example, this could happen during contact sports, a car accident, or a fall against a hard object.  Repetitive movements with high force, such as pitching a baseball or having severe coughing spells. SYMPTOMS   Pain when you breathe in or cough.  Pain when someone presses on the injured area. DIAGNOSIS  Your caregiver will perform a physical exam. Various imaging tests may be ordered to confirm the diagnosis and to look for related injuries. These tests may include a chest X-ray, computed tomography (CT), magnetic resonance imaging (MRI), or a bone scan. TREATMENT  Rib fractures usually heal on their own in 1-3 months. The longer healing period is often associated with a continued cough or other aggravating activities. During the healing period, pain control is very important. Medication is usually given to control pain. Hospitalization or surgery may be needed for more severe injuries, such as those in which multiple ribs are broken or the ribs have moved out of place.  HOME CARE INSTRUCTIONS   Avoid strenuous activity and any activities or movements that cause pain. Be careful during activities and avoid bumping the injured rib.  Gradually increase activity as directed by your caregiver.  Only take over-the-counter or prescription medications as directed by your caregiver. Do not take other medications without asking your caregiver first.  Apply ice to the injured area for the first 1-2 days after you have been treated or as directed by your caregiver. Applying ice helps to reduce inflammation and pain.  Put ice in a plastic bag.  Place a towel between your skin and the bag.   Leave the ice on for 15-20 minutes at a time, every 2 hours while you are awake.  Perform deep breathing as directed by your caregiver. This will help prevent pneumonia, which is a common complication of a broken rib. Your  caregiver may instruct you to:  Take deep breaths several times a day.  Try to cough several times a day, holding a pillow against the injured area.  Use a device called an incentive spirometer to practice deep breathing several times a day.  Drink enough fluids to keep your urine clear or pale yellow. This will help you avoid constipation.   Do not wear a rib belt or binder. These restrict breathing, which can lead to pneumonia.  SEEK IMMEDIATE MEDICAL CARE IF:   You have a fever.   You have difficulty breathing or shortness of breath.   You develop a continual cough, or you cough up thick or bloody sputum.  You feel sick to your stomach (nausea), throw up (vomit), or have abdominal pain.   You have worsening pain not controlled with medications.  MAKE SURE YOU:  Understand these instructions.  Will watch your condition.  Will get help right away if you are not doing well or get worse.   This information is not intended  to replace advice given to you by your health care provider. Make sure you discuss any questions you have with your health care provider.   Document Released: 01/17/2005 Document Revised: 09/19/2012 Document Reviewed: 03/21/2012 Elsevier Interactive Patient Education [employer].

## 2015-04-16 NOTE — ED Notes (Signed)
Pt was bent over in kitchen and lost balance hitting bridge of nose.  Pt reports bilateral skin tears to knees and elbows, right hip abrasion with hematoma and laceration to bridge of nose.

## 2015-04-16 NOTE — ED Provider Notes (Signed)
CSN: [id number]     Arrival date & time 04/16/15  1424 History   First MD Initiated Contact with Patient 04/16/15 1425     Chief Complaint  Patient presents with  . Fall     (Consider location/radiation/quality/duration/timing/severity/associated sxs/prior Treatment) HPI Comments:  patient is an 82 year old male with a history of atrial fibrillation on Eliquis, pulmonary hypertension, coronary artery disease, sick sinus syndrome with pacemaker placement, cirrhosis of the liver who quit drinking approximately 3 weeks ago who presents today after a fall. Patient states he has been falling more often lately but states he was leaning over to get something and lost his balance falling head first into the wood floor. He denies any loss of consciousness but hasn't noticed pain in his right side and swelling since the fall as well as a laceration and bleeding of his nose. He states he otherwise feels in his normal state of health. He denies any leg pain or numbness.  Patient is a 82 y.o. male presenting with fall. The history is provided by the patient.  Fall    Past Medical History  Diagnosis Date  . Sleep apnea   . Atrial fibrillation ([place])   . SSS (sick sinus syndrome) ([facility])     S/P MDT PPM  . Coronary artery disease   . Hypertension   . Hyperlipidemia   . Pulmonary hypertension ([place])   . History of interstitial lung disease   . Diverticulosis   . Esophageal dilatation   . Hypothyroidism   . Gout   . Myocardial infarction ([place])   . Pacemaker     [employer]  . SOB (shortness of breath) on exertion     sees dr. Lamonte Sakai occassionally  . GERD (gastroesophageal reflux disease)   . Arthritis     neck  . Allergy     codeine  . CHF (congestive heart failure) ([place])   . 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 Carotid artery occlusion    Past Surgical History  Procedure Laterality Date  . Cardiac catheterization  08/08/2006    EF 50%  . Cardiac catheterization  07/26/2003      EF 40%  . Insert / replace / remove pacemaker  2005    IMPLANT  . Coronary artery bypass graft  08/06/2003    3Vessel LIMA-dLAD, SVG-CFX/OM, SVG-RPL w/ MV #26 ring, TV #30 ring, [facility] Maze  . Mitral and tricuspid valve annuloplasty  08/06/2003    Dr. Roxy Manns  . Polypectomy    . Cataract extraction    . Cox-maze microwave ablation    . US echocardiography  07/12/2006    EF 50-55%  . US echocardiography  12/16/2003    EF 55-60%  . Cardiovascular stress test  07/11/2006    EF 50%  . Mass excision N/A 04/20/2012    Procedure: EXCISION OF LOWER LIP CANCER AND EXCISION SUBMENTAL LYMPH NODES;  Surgeon: Rozetta Nunnery, MD;  Location: [facility];  Service: ENT;  Laterality: N/A;  . Radical neck dissection Left 08/17/2012    Procedure: LEFT NECK DISSECTION;  Surgeon: Rozetta Nunnery, MD;  Location: [facility];  Service: ENT;  Laterality: Left;  . Cardiovascular stress test  06/17/2013    low risk scan, EF 51%  . Eye surgery    . Permanent pacemaker generator change N/A 06/13/2013    Procedure: PERMANENT PACEMAKER GENERATOR CHANGE;  Surgeon: Deboraha Sprang, MD;  Location: [facility] CATH LAB;  Service: Cardiovascular;  Laterality: N/A;  Family History  Problem Relation Age of Onset  . Hypertension Mother   . Hyperlipidemia Mother   . Stroke Father   . Coronary artery disease Brother   . Hypertension Brother   . Hyperlipidemia Brother   . Heart attack Neg Hx    Social History  Substance Use Topics  . Smoking status: Former Smoker -- 1.00 packs/day for 15 years    Types: Cigarettes    Quit date: 09/20/1980  . Smokeless tobacco: Never Used  . Alcohol Use: 1.2 oz/week    2 Glasses of wine per week     Comment: 2 glasses of wine daily    Review of Systems  All other systems reviewed and are negative.     Allergies  Amoxicillin; Codeine; and Tramadol  Home Medications   Prior to Admission medications   Medication Sig Start Date End Date Taking? Authorizing Provider  apixaban (ELIQUIS) 2.5  MG TABS tablet Take 2.5 mg by mouth 2 (two) times daily.    Historical Provider, MD  aspirin 81 MG tablet Take 81 mg by mouth daily.      Historical Provider, MD  cholecalciferol (VITAMIN D) 1000 UNITS tablet Take 1,000 Units by mouth daily.    Historical Provider, MD  febuxostat (ULORIC) 40 MG tablet Take 40 mg by mouth daily.     Historical Provider, MD  furosemide (LASIX) 20 MG tablet Take 1 tablet by mouth daily as needed.  07/07/10   Historical Provider, MD  lansoprazole (PREVACID) 30 MG capsule Take 30 mg by mouth daily.      Historical Provider, MD  levothyroxine (SYNTHROID, LEVOTHROID) 100 MCG tablet Take 100 mcg by mouth daily.      Historical Provider, MD  nebivolol (BYSTOLIC) 5 MG tablet Take 1 tablet (5 mg total) by mouth daily. 02/25/15   Tanda Rockers, MD  PROAIR HFA 108 (90 BASE) MCG/ACT inhaler USE 2 PUFFS EVERY 4 HOURS AS NEEDED FOR WHEEZE, COUGH, SHORTNESS OF BREATH 09/26/14   Historical Provider, MD  valsartan (DIOVAN) 160 MG tablet Take 1 tablet (160 mg total) by mouth daily. 02/25/15   Tanda Rockers, MD  vitamin B-12 (CYANOCOBALAMIN) 100 MCG tablet Take 100 mcg by mouth daily.    Historical Provider, MD   There were no vitals taken for this visit. Physical Exam  Constitutional: He is oriented to person, place, and time. He appears well-developed and well-nourished. No distress.  HENT:  Head: Normocephalic. Head is with laceration.    Mouth/Throat: Oropharynx is clear and moist.  Eyes: Conjunctivae and EOM are normal. Pupils are equal, round, and reactive to light.  Neck: Normal range of motion. Neck supple. No spinous process tenderness and no muscular tenderness present.  Cardiovascular: Normal rate, regular rhythm and intact distal pulses.   No murmur heard. Pulmonary/Chest: Effort normal and breath sounds normal. No respiratory distress. He has no wheezes. He has no rales. He exhibits no tenderness.  Abdominal: Soft. He exhibits ascites. He exhibits no distension. There  is tenderness in the right lower quadrant. There is no rebound, no guarding and no CVA tenderness.    Musculoskeletal: Normal range of motion. He exhibits no edema or tenderness.  Skin changes consistent with chronic venous stasis. Skin tears to bilateral knees. Skin tear present to bilateral elbows.  Neurological: He is alert and oriented to person, place, and time.  All extremities without difficulty  Skin: Skin is warm and dry. No rash noted. No erythema.  Psychiatric: He has a normal mood  and affect. His behavior is normal.  Nursing note and vitals reviewed.   ED Course  Procedures (including critical care time) Labs Review Labs Reviewed  CBC WITH DIFFERENTIAL/PLATELET  I-STAT CHEM 8, ED    Imaging Review No results found. I have personally reviewed and evaluated these images and lab results as part of my medical decision-making.   EKG Interpretation None      MDM   Final diagnoses:  None   Patient is an 82 year old gentleman with multiple medical problems who was bending forward today and lost his balance falling face first into the floor. Patient does take anticoagulation and has a laceration to the bridge of the nose as well as skin tears to bilateral elbows and knees. He denies any chest pain, palpitations, shortness of breath that started prior to the fall or after. He feels that he is in his normal state of health. Patient is also noted swelling and tenderness to the right side since his fall. Patient was drinking 2 glasses of wine a day but was recently diagnosed with cirrhosis and stop drinking 3 weeks ago. He denies any symptoms of withdrawal at this time.  He denies any LOC during or after the fall. Other than skin tears, hematoma to the right side of the abdomen and nose laceration no other positive exam findings. Patient has evidence of cirrhosis and findings crackles throughout lung exam which appears to be stable.  CBC, chem 8, CT of the head, neck and abdomen  pending. Patient's tetanus shot was updated. Patient's nose laceration was repaired with Dermabond.  Wound care was applied to skin tears  3:11 PM Patient had an EKG done that was ordered in triage. His EKG today is not paced and has deep T-wave inversions but no old to compare because every prior EKG has showed a paced rhythm. Patient denies any chest pain, shortness of breath, palpitations before after or during the fall. Will interrogate pacemaker.  Blanchie Dessert, MD 05/13/15 2106

## 2015-04-16 NOTE — ED Provider Notes (Signed)
EKG Interpretation #2  Date/Time:  Thursday April 16 2015 16:15:08 EDT Ventricular Rate:  60 PR Interval:  433 QRS Duration: 174 QT Interval:  524 QTC Calculation: 524 R Axis:   -89 Text Interpretation:  Ventricular-paced rhythm Confirmed by Alexa Golebiewski  MD, Lennette Bihari ([id number]) on 04/16/2015 5:33:59 PM      LACERATION REPAIR Performed by: Hoy Morn Consent: Verbal consent obtained. Risks and benefits: risks, benefits and alternatives were discussed Patient identity confirmed: provided demographic data Time out performed prior to procedure Prepped and Draped in normal sterile fashion Wound explored Laceration Location: left forearm Laceration Length: 5cm No Foreign Bodies seen or palpated Anesthesia: none Irrigation method: saline guaze Amount of cleaning: standard Skin closure: steristrips Number of sutures or staples: steristrips Technique: steristrips Patient tolerance: Patient tolerated the procedure well with no immediate complications.   LACERATION REPAIR Performed by: Hoy Morn Consent: Verbal consent obtained. Risks and benefits: risks, benefits and alternatives were discussed Patient identity confirmed: provided demographic data Time out performed prior to procedure Prepped and Draped in normal sterile fashion Wound explored Laceration Location: right elbow Laceration Length: 4.5 cm No Foreign Bodies seen or palpated Anesthesia: none Irrigation method: saline guaze Amount of cleaning: standard Skin closure: steristrips Number of sutures or staples: steristrips Technique: steristrips Patient tolerance: Patient tolerated the procedure well with no immediate complications.   Lacerations repaired with Steri-Strips.  Patient with a nasal fracture and a nondisplaced 12th rib fracture.  Overall well-appearing.  Repeat abdominal exam is benign.  Bleeding controlled.  Patient is on Eliquis  Discharge home in good condition.  Head injury and infection warnings  given.  Jola Schmidt, MD 04/16/15 [phone]

## 2015-04-20 ENCOUNTER — Ambulatory Visit (INDEPENDENT_AMBULATORY_CARE_PROVIDER_SITE_OTHER): Payer: Medicare Other | Admitting: *Deleted

## 2015-04-20 DIAGNOSIS — I442 Atrioventricular block, complete: Secondary | ICD-10-CM

## 2015-04-20 DIAGNOSIS — Z950 Presence of cardiac pacemaker: Secondary | ICD-10-CM | POA: Diagnosis not present

## 2015-04-24 NOTE — Progress Notes (Signed)
Remote pacemaker transmission.   

## 2015-05-05 ENCOUNTER — Ambulatory Visit: Payer: Self-pay | Admitting: Internal Medicine

## 2015-05-20 ENCOUNTER — Encounter: Payer: Self-pay | Admitting: Cardiology

## 2015-05-20 LAB — CUP PACEART REMOTE DEVICE CHECK
Battery Remaining Longevity: 141 mo
Implantable Lead Implant Date: 20050712
Implantable Lead Implant Date: 20050712
Implantable Lead Location: 753859
Implantable Lead Serial Number: 448060
Implantable Lead Serial Number: 480140
Lead Channel Impedance Value: 67 Ohm
Lead Channel Impedance Value: 727 Ohm
Lead Channel Pacing Threshold Amplitude: 0.5 V
Lead Channel Setting Pacing Amplitude: 2.5 V
MDC IDC LEAD LOCATION: 753860
MDC IDC MSMT BATTERY IMPEDANCE: 158 Ohm
MDC IDC MSMT BATTERY VOLTAGE: 2.78 V
MDC IDC MSMT LEADCHNL RV PACING THRESHOLD PULSEWIDTH: 0.4 ms
MDC IDC SESS DTM: 20170320214721
MDC IDC SET LEADCHNL RV PACING PULSEWIDTH: 0.4 ms
MDC IDC SET LEADCHNL RV SENSING SENSITIVITY: 2.8 mV
MDC IDC STAT BRADY RV PERCENT PACED: 92 %

## 2015-06-09 ENCOUNTER — Other Ambulatory Visit: Payer: Self-pay | Admitting: Internal Medicine

## 2015-06-11 ENCOUNTER — Ambulatory Visit
Admission: RE | Admit: 2015-06-11 | Discharge: 2015-06-11 | Disposition: A | Discharge status: Home / Self Care | Payer: Medicare Other | Source: Ambulatory Visit | Attending: Internal Medicine | Admitting: Internal Medicine

## 2015-06-17 ENCOUNTER — Other Ambulatory Visit (HOSPITAL_COMMUNITY): Payer: Self-pay | Admitting: *Deleted

## 2015-06-18 ENCOUNTER — Ambulatory Visit (HOSPITAL_COMMUNITY)
Admission: RE | Admit: 2015-06-18 | Discharge: 2015-06-18 | Disposition: A | Discharge status: Home / Self Care | Payer: Medicare Other | Source: Ambulatory Visit | Attending: Internal Medicine | Admitting: Internal Medicine

## 2015-06-18 MED ORDER — ZOLEDRONIC ACID 4 MG/5ML IV CONC
3.3000 mg | Freq: Once | INTRAVENOUS | Status: AC
  Administered 2015-06-18: 3.3 mg via INTRAVENOUS
  Filled 2015-06-18: qty 4.13

## 2015-06-18 MED ORDER — SODIUM CHLORIDE 0.9 % IV SOLN
4.0000 mg | Freq: Once | INTRAVENOUS | Status: DC
  Filled 2015-06-18: qty 5

## 2015-06-18 NOTE — Discharge Instructions (Signed)

## 2015-07-23 ENCOUNTER — Ambulatory Visit: Payer: Self-pay | Admitting: Internal Medicine

## 2015-08-03 ENCOUNTER — Encounter: Payer: Self-pay | Admitting: Adult Health

## 2015-08-03 ENCOUNTER — Ambulatory Visit (INDEPENDENT_AMBULATORY_CARE_PROVIDER_SITE_OTHER): Payer: Medicare Other | Admitting: Adult Health

## 2015-08-03 VITALS — BP 142/86 | HR 70 | Temp 98.0°F | Ht 71.0 in | Wt 162.0 lb

## 2015-08-03 DIAGNOSIS — J84112 Idiopathic pulmonary fibrosis: Secondary | ICD-10-CM

## 2015-08-03 DIAGNOSIS — J9611 Chronic respiratory failure with hypoxia: Secondary | ICD-10-CM | POA: Diagnosis not present

## 2015-08-03 DIAGNOSIS — R490 Dysphonia: Secondary | ICD-10-CM | POA: Diagnosis not present

## 2015-08-03 NOTE — Assessment & Plan Note (Signed)
Hoarseness -possible laryngitis  Advised on salt water gargles,  Voice rest  Add zyrtec for possible post nasal drip .  Saline nasal rinses  If not resolving , refer to ENT   Plan  Warm salt water gargles As needed   Add zyrtec 10mg  At bedtime  For drainage  Saline nasal spray As needed   If not getting better , call back for sooner follow up  Please contact office for sooner follow up if symptoms do not improve or worsen or seek emergency care  Continue on Oxygen 4l/m  Follow up Dr. Chase Caller in 3 months and As needed

## 2015-08-03 NOTE — Patient Instructions (Signed)
Warm salt water gargles As needed   Add zyrtec 10mg  At bedtime  For drainage  Saline nasal spray As needed   If not getting better , call back for sooner follow up  Please contact office for sooner follow up if symptoms do not improve or worsen or seek emergency care  Continue on Oxygen 4l/m  Follow up Dr. Chase Caller in 3 months and As needed

## 2015-08-03 NOTE — Assessment & Plan Note (Signed)
Stable without flare  Recent CT chest in may with stable changes  Declined antifibroitic in past.  Cont w/ o2.

## 2015-08-03 NOTE — Assessment & Plan Note (Signed)
Continue on Oxygen 4l/m  Follow up Dr. Chase Caller in 3 months and As needed

## 2015-08-03 NOTE — Progress Notes (Signed)
Subjective:    Patient ID: [name] [name], male    DOB: Dec 07, 1930, 82 y.o.   MRN: [id number]  HPI 82 yo male with ILD   TEST   PFT 2008 showed normal airflow, normal lung volumes, but decreased DLCO; Note- no desat noted after 3 laps in the office on RA...  CT Chest 2008 showed increased subpleural interstitial opacities w/ LL predom- sl progressed from 2005 scan, felt to be a UIP pattern.  Collagen-vasc labs were neg  Ba swallow 2008 showed some laryngeal penetration & aspiration, improved w/ antireflux measures, chin tuck and pureed foods  Spirometry 5/11 showed FVC=2.83 (71%), FEV1=2.22 (78%), %1sec=78; and ambulatory O2sats were still wnl- no desat noted  Spirometry 10/02/14> FVC=1.9 (52%), FEV1=1.7 (66%), %1sec=88, mid-flows wnl; This is c/o mod severe restrictive ventilatory defect, no obstructive dis evident... Hi-res CT Chest done 10/10/14 & showed mild cardiomeg, s/p CABG/ annulopasties/ maze procedure/ & right sided pacer, atherosclerotic dis in Ao/ great vessels/ all 3 coronoaries, +mediatinal adenopathy up to 55mm size; ILD w/ ground glass attenuation in lungs bilat w/ extensive subpleural & peribronchovasc reticulation & thickening of the interstitium w/ mild craniocaudal gradient, mild traction bronchiectasis, mild honeycombing and only mild progression compared to 2012; No lung nodules or masses => Although there are some features that could be suggestive of usual interstitial pneumonia (UIP), the minimal progression compared to the prior study, presence of extensive ground-glass attenuation, minimal honeycombing and a lack of a strong craniocaudal gradient may suggest an alternative diagnosis of 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 2DEcho 10/10/14 showed mild LVH, mild AI/ MR/ and LAdil; RV is severely dilated w/ severely reduced RVF, mod TR, severe RAdil, and PAsys=70mmHg...   08/03/2015 Follow up : ILD  Pt returns for a 6 month follow up  Pt  complains of 2 weeks of hoarseness. Worse as day progresses.  No sore throat , dysphagia , fever, reflux, . Eating good, no trouble with swallowing .  Has some dry cough . No discolored mucus. Has some sinus drainage , clear and dry mouth .  Breathing is doing about the same. O2 sats have been >90% on 4l/m .  He has ILD (presumed IPF)  with associated severe pulmonary HTN on chronic Oxygen at 3l/m rest and 4l/m walking  Workup w/ neg autoimmune. Panel.  Pt continues to have DOE with minimal activity. Feels his dyspnea is getting worse over the past year.  Has declined antifibrotic with Esbriet and OFEV in past   On Eliquis daily . Hx of atrial fib.  He denies hemoptysis, chest pain, orthopnea, or increased edema.   Follows with Dr. Watt Climes for Cirrhosis with ascites .  Had CT chest in May that showed stable pulmonary fibrosis .   Past Medical History  Diagnosis Date  . Sleep apnea   . Atrial fibrillation ([place])   . SSS (sick sinus syndrome) ([facility])     S/P MDT PPM  . Coronary artery disease   . Hypertension   . Hyperlipidemia   . Pulmonary hypertension ([place])   . History of interstitial lung disease   . Diverticulosis   . Esophageal dilatation   . Hypothyroidism   . Gout   . Myocardial infarction ([place])   . Pacemaker     Medtronic  . SOB (shortness of breath) on exertion     sees dr. Lamonte Sakai occassionally  . GERD (gastroesophageal reflux disease)   . Arthritis     neck  . Allergy  codeine  . CHF (congestive heart failure) ([place])   . 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 Carotid artery occlusion    Current Outpatient Prescriptions on File Prior to Visit  Medication Sig Dispense Refill  . apixaban (ELIQUIS) 2.5 MG TABS tablet Take 2.5 mg by mouth 2 (two) times daily.    Marland Kitchen escitalopram (LEXAPRO) 10 MG tablet Take 10 mg by mouth daily.  11  . febuxostat (ULORIC) 40 MG tablet Take 40 mg by mouth daily.     Marland Kitchen levothyroxine (SYNTHROID, LEVOTHROID) 100 MCG tablet  Take 100 mcg by mouth daily.      Marland Kitchen PROAIR HFA 108 (90 BASE) MCG/ACT inhaler USE 2 PUFFS EVERY 4 HOURS AS NEEDED FOR WHEEZE, COUGH, SHORTNESS OF BREATH  5  . vitamin B-12 (CYANOCOBALAMIN) 100 MCG tablet Take 100 mcg by mouth daily.    Marland Kitchen aspirin 81 MG tablet Take 81 mg by mouth daily. Reported on 08/03/2015    . cholecalciferol (VITAMIN D) 1000 UNITS tablet Take 1,000 Units by mouth daily. Reported on 08/03/2015    . furosemide (LASIX) 20 MG tablet Take 1 tablet by mouth daily as needed for fluid. Reported on 08/03/2015    . HYDROcodone-acetaminophen (NORCO/VICODIN) 5-325 MG tablet Take 1 tablet by mouth every 4 (four) hours as needed for moderate pain. (Patient not taking: Reported on 08/03/2015) 15 tablet 0  . lansoprazole (PREVACID) 30 MG capsule Take 30 mg by mouth daily. Reported on 08/03/2015    . nebivolol (BYSTOLIC) 5 MG tablet Take 1 tablet (5 mg total) by mouth daily. (Patient not taking: Reported on 04/16/2015)  0  . valsartan (DIOVAN) 160 MG tablet Take 1 tablet (160 mg total) by mouth daily. (Patient not taking: Reported on 04/16/2015) 30 tablet 11   No current facility-administered medications on file prior to visit.      Review of Systems Constitutional:   No  weight loss, night sweats,  Fevers, chills,  +fatigue, or  lassitude.  HEENT:   No headaches,  Difficulty swallowing,  Tooth/dental problems, or  Sore throat,                No sneezing, itching, ear ache, nasal congestion,  ?post nasal drip,   CV:  No chest pain,  Orthopnea, PND,  anasarca, dizziness, palpitations, syncope.   GI  No heartburn, indigestion, abdominal pain, nausea, vomiting, diarrhea, change in bowel habits, loss of appetite, bloody stools.   Resp:    No chest wall deformity  Skin: no rash or lesions.  GU: no dysuria, change in color of urine, no urgency or frequency.  No flank pain, no hematuria   MS:  No joint pain or swelling.  No decreased range of motion.  No back pain.  Psych:  No change in mood or  affect. No depression or anxiety.  No memory loss.          Objective:   Physical Exam Filed Vitals:   08/03/15 1408  BP: 142/86  Pulse: 70  Temp: 98 F (36.7 C)  TempSrc: Oral  Height: 5\' 11"  (1.803 m)  Weight: 162 lb (73.483 kg)  SpO2: 93%   Body mass index is 22.6 kg/(m^2).   GEN: A/Ox3; pleasant , NAD, frail and elderly   HEENT:  [place]/AT,  EACs-clear, TMs-wnl, NOSE-clear, THROAT-clear, no lesions, no postnasal drip or exudate noted.   NECK:  Supple w/ fair ROM; no JVD; normal carotid impulses w/o bruits; no thyromegaly or nodules palpated;  no lymphadenopathy.  RESP  BB crackles no accessory muscle use, no dullness to percussion  CARD:  RRR, no m/r/g  , tr -1 peripheral edema, pulses intact, no cyanosis or clubbing.  GI:   Distended  nml bowel sounds;  Musco: Warm bil, no deformities or joint swelling noted.   Neuro: alert, no focal deficits noted.  Unsteady gait.   Skin: Warm, no lesions or rashes   .Aki Abalos NP-C  [facility]  08/03/2015

## 2015-08-19 ENCOUNTER — Encounter (HOSPITAL_COMMUNITY): Payer: Self-pay | Admitting: *Deleted

## 2015-08-19 NOTE — Progress Notes (Signed)
Called patient to follow up with him about attending Pulmonary Maintenance Program. Spoke with his wife. She states that he is unable to talk. She states that he is unable to talk due to some issues with his trachea and is he is not able to walk much at all. He is on O2 24/7 and is not able to do the program.

## 2015-08-21 ENCOUNTER — Other Ambulatory Visit: Payer: Self-pay | Admitting: Otolaryngology

## 2015-08-21 DIAGNOSIS — J3800 Paralysis of vocal cords and larynx, unspecified: Secondary | ICD-10-CM

## 2015-08-30 NOTE — Progress Notes (Signed)
Cardiology Office Note Date:  08/31/2015  Patient ID:  [name] [name], DOB [birth year], MRN [id number] PCP:  Jerlyn Ly, MD  Cardiologist:  Dr. [place] Electrophysiologist: Dr. Caryl Comes Pulmonary: Dr. Chase Caller Gastroenterology: Dr. Sarina Ser   Chief Complaint: Pacer visit  History of Present Illness: [name] [name] is a 82 y.o. male with history of permanent Afib, hx of Cox-Maze ablation 2008, PPM implant(SSSx), CAD (CABG 2005), VHD with mitral and tricuspid annuloplasty (2005), HTN, HLD, interstitial lung disease, P.HTN, hypothyroidism, cancer (lip, lymph node removal), cirrhosis/ascites.  He comes in today to be seen for Dr. Caryl Comes, accompanied by his daughter, last seen by EP service July 2016, at that time doing well. He c/w his PMD and GI specialist given his cirrhosis and acites, he has had a number of medications discontinued via these services secondary to lower BP readings, and the daughter thinks dehydration and abnormal calcium levels.  The patient denies any kind of CP, he has chronic and significant SOB, but reports at his baseline.  No palpitations, no near syncope or syncope.  The patient's daughter mentions she found out that the company the patient gets his O2 tanks from can provide finger stick INR kit for home, and plans to discuss switching back to warfarin stating they would prefer this to the newer a/c medications for their comfort level.  He bruises easily, but denies overt bleeding or signs of bleeding.  They do not report an control problems with the warfarin, noting was dietary restrictions and lab visits the reason it was changed.  The daughter mentions GI has said he would prefer he not be on the a/c medication at all but not made the recommendation to stop it or ask it to be stopped though did instruct him to stop the ASA which he has. He sees Dr. Sarina Ser next week.  He has had 2-3 falls in the last year, they rport he ambulates well with walker, and uses wheelchair for  longer distances though.  The fall episodes have been when he has been without his walker.  We discussed the improtance of safety especially with his a/c.  Noted darkened skin coloration to his b/l LE/feet, this is reported as chronic, the patient denies any pain, claudication and they report discolorattin is somewhat improved when he has his feet up.   Past Medical History:  Diagnosis Date  . Allergy    codeine  . Arthritis    neck  . Atrial fibrillation ([place])   . 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 Carotid artery occlusion   . CHF (congestive heart failure) ([place])   . Coronary artery disease   . Diverticulosis   . Esophageal dilatation   . GERD (gastroesophageal reflux disease)   . Gout   . History of interstitial lung disease   . Hyperlipidemia   . Hypertension   . Hypothyroidism   . Myocardial infarction ([place])   . Pacemaker    Medtronic  . Pulmonary hypertension ([place])   . Sleep apnea   . SOB (shortness of breath) on exertion    sees dr. Lamonte Sakai occassionally  . SSS (sick sinus syndrome) ([facility])    S/P MDT PPM    Past Surgical History:  Procedure Laterality Date  . CARDIAC CATHETERIZATION  08/08/2006   EF 50%  . CARDIAC CATHETERIZATION  07/26/2003   EF 40%  . CARDIOVASCULAR STRESS TEST  07/11/2006   EF 50%  . CARDIOVASCULAR STRESS TEST  06/17/2013   low  risk scan, EF 51%  . CATARACT EXTRACTION    . CORONARY ARTERY BYPASS GRAFT  08/06/2003   3Vessel LIMA-dLAD, SVG-CFX/OM, SVG-RPL w/ MV #26 ring, TV #30 ring, Cox Maze  . COX-MAZE MICROWAVE ABLATION    . EYE SURGERY    . INSERT / REPLACE / REMOVE PACEMAKER  2005   IMPLANT  . MASS EXCISION N/A 04/20/2012   Procedure: EXCISION OF LOWER LIP CANCER AND EXCISION SUBMENTAL LYMPH NODES;  Surgeon: Rozetta Nunnery, MD;  Location: [facility];  Service: ENT;  Laterality: N/A;  . MITRAL and TRICUSPID VALVE ANNULOPLASTY  08/06/2003   Dr. Roxy Manns  . PERMANENT PACEMAKER GENERATOR CHANGE N/A 06/13/2013   Procedure: PERMANENT  PACEMAKER GENERATOR CHANGE;  Surgeon: Deboraha Sprang, MD;  Location[facility] C[facility] LAB;  Service: Cardiovascular;  Laterality: N/A;  . POLYPECTOMY    . RADICAL NECK DISSECTION Left 08/17/2012   Procedure: LEFT NECK DISSECTION;  Surgeon: Rozetta Nunnery, MD;  Location: [facility];  Service: ENT;  Laterality: Left;  . US ECHOCARDIOGRAPHY  07/12/2006   EF 50-55%  . US ECHOCARDIOGRAPHY  12/16/2003   EF 55-60%    Current Outpatient Prescriptions  Medication Sig Dispense Refill  . apixaban (ELIQUIS) 2.5 MG TABS tablet Take 2.5 mg by mouth 2 (two) times daily.    Marland Kitchen escitalopram (LEXAPRO) 10 MG tablet Take 10 mg by mouth daily.  11  . esomeprazole (NEXIUM) 20 MG capsule Take 20 mg by mouth daily at 12 noon.    . febuxostat (ULORIC) 40 MG tablet Take 40 mg by mouth daily.     Marland Kitchen lactulose (CHRONULAC) 10 GM/15ML solution Take 3 times a day  11  . levothyroxine (SYNTHROID, LEVOTHROID) 100 MCG tablet Take 100 mcg by mouth daily.      Marland Kitchen PROAIR HFA 108 (90 BASE) MCG/ACT inhaler USE 2 PUFFS EVERY 4 HOURS AS NEEDED FOR WHEEZE, COUGH, SHORTNESS OF BREATH  5  . vitamin B-12 (CYANOCOBALAMIN) 100 MCG tablet Take 100 mcg by mouth daily.    . Xylitol (XYLIMELTS MT) Use at bedtime     No current facility-administered medications for this visit.     Allergies:   Amoxicillin; Codeine; Contrast media [iodinated diagnostic agents]; and Tramadol   Social History:  The patient  reports that he quit smoking about 34 years ago. His smoking use included Cigarettes. He has a 15.00 pack-year smoking history. He has never used smokeless tobacco. He reports that he drinks about 1.2 oz of alcohol per week . He reports that he does not use drugs.   Family History:  The patient's family history includes Coronary artery disease in his brother; Hyperlipidemia in his brother and mother; Hypertension in his brother and mother; Stroke in his father.  ROS:  Please see the history of present illness.  All other systems are reviewed and  otherwise negative.  PHYSICAL EXAM:  VS:  BP 108/76 (BP Location: Left Arm, Patient Position: Sitting, Cuff Size: Small)   Pulse 64   Ht 5' 11"  (1.803 m)   Wt 156 lb (70.8 kg)   BMI 21.76 kg/m  BMI: Body mass index is 21.76 kg/m. Thin, frain appearing, thougg appears well nourished, in no acute distress  HEENT: normocephalic, atraumatic  Neck: no JVD, carotid bruits or masses Cardiac:  RRR (paced); no significant murmurs, no rubs, or gallops Lungs:  clear to auscultation bilaterally, no wheezing, rhonchi or rales  Abd: soft, nontender MS: no deformity, age appropriate atrophy Ext: no edema, he has darkened skin  discoloration to his b/l LE, this is rported as chronic by the patient and daughter, worse when his legs ar e down.  He has palpable 2+ PT and 1-2+DP pedal pulses b/l, warm Skin: warm and dry, no rash Neuro:  No gross deficits appreciated Psych: euthymic mood, full affect  PPM site is stable, no tethering or discomfort   EKG:  Done today and reviewed by myself is V paced PPM interrogation today shows normal function, underlying rate 56bpm,  programmed VVI  10/10/14: Echocardiogram Study Conclusions - Left ventricle: The cavity size was normal. Wall thickness was   increased in a pattern of mild LVH.  LVEF is probably normal to mildly decreased - Aortic valve: There was mild regurgitation. Valve area (VTI):   1.22 cm^2. Valve area (Vmean): 1.16 cm^2. - Mitral valve: There was mild regurgitation. Valve area by   continuity equation (using LVOT flow): 1.46 cm^2. - Left atrium: The atrium was mildly dilated. - Right ventricle: The cavity size was severely dilated. Systolic   function was severely reduced. - Right atrium: The atrium was severely dilated. - Tricuspid valve: There was mild-moderate regurgitation. - Pulmonary arteries: PA peak pressure: 91 mm Hg (S).  06/03/13: Stress myoview Impression Exercise Capacity:  Adenosine study with no exercise. BP Response:   Normal blood pressure response. Clinical Symptoms:  No chest pain. ECG Impression:  Baseline:  LBBB.  EKG uninterpretable due to LBBB at rest and stress. Comparison with Prior Nuclear Study: No images to compare Overall Impression:  Low risk stress nuclear study. Old small apical scar.  Old small inferolateral scar. No reversible ischemia. LV Ejection Fraction: 51%.  LV Wall Motion:  Normal Wall Motion  Recent Labs: 04/16/2015: BUN 27; Creatinine, Ser 1.40; Hemoglobin 15.0; Platelets 264; Potassium 4.1; Sodium 137  No results found for requested labs within last 8760 hours.   CrCl cannot be calculated (Patient's most recent lab result is older than the maximum 21 days allowed.).   Wt Readings from Last 3 Encounters:  08/31/15 156 lb (70.8 kg)  08/03/15 162 lb (73.5 kg)  06/18/15 155 lb (70.3 kg)     Other studies reviewed: Additional studies/records reviewed today include: summarized above  DEVICE information:  MDT dual chamber PPM, implanted 08/12/03, generator change 06/13/13, Dr. Caryl Comes Programmed VVI with permanent AFib  ASSESSMENT AND PLAN:  1. Permanent Afib     CHA2DS2Vasc is at least 4 on Eliquis       No objection to changing back to warfarin (patient/daughter preference) particularly given VHD, they will discuss and pursue with his PMD, given this is who managed his INR previously and they prefer to again  2. SSX s/p PPM     intact device function     q3 month remote  3. ILD, severe p.HTN     Chronic O2 use 3L at rest 4L with ambulation     Dr. Melvyn Novas  4. Cirrhosis/Ascites     Dr. Sarina Ser  5. CAD     no anginal c/o     Negative stress 2015     not statin, likely secondary to liver disease  6. VHD      Last echo stable  7. chronic LE skin changes     Palpable pedal pulses, no claudication/pain reported     Sees PMD later this week, will defer to his service any work up  8. HTN     Reduction in medicines in the last 2 months byhis PMD/GI reportedly with lower  readings  Looks OK today    Disposition: F/u with his PMD later this week as planned and GI next week, will defer a/c agent and f/u to the PMD the patient/daughter prefer to c/w there his a/c management and any LE evaluation felt needed.   If GI feels he should not be on blood thinner, to reach out to [place], they will discuss at the next visit.    We will plan for remote pacer check in 3 months EP office visit 1 year, sooner if needed.  Current medicines are reviewed at length with the patient today.  The patient did not have any concerns regarding medicines.  [name] Lasso, PA-C 08/31/2015 4:51 PM     [facility] [address] [place] [address] [phone] (office)  [phone] (fax)

## 2015-08-31 ENCOUNTER — Ambulatory Visit (INDEPENDENT_AMBULATORY_CARE_PROVIDER_SITE_OTHER): Payer: Medicare Other | Admitting: Physician Assistant

## 2015-08-31 ENCOUNTER — Encounter: Payer: Self-pay | Admitting: Physician Assistant

## 2015-08-31 VITALS — BP 108/76 | HR 64 | Ht 71.0 in | Wt 156.0 lb

## 2015-08-31 DIAGNOSIS — I10 Essential (primary) hypertension: Secondary | ICD-10-CM

## 2015-08-31 DIAGNOSIS — I495 Sick sinus syndrome: Secondary | ICD-10-CM

## 2015-08-31 DIAGNOSIS — I2510 Atherosclerotic heart disease of native coronary artery without angina pectoris: Secondary | ICD-10-CM

## 2015-08-31 DIAGNOSIS — I482 Chronic atrial fibrillation: Secondary | ICD-10-CM

## 2015-08-31 DIAGNOSIS — R0602 Shortness of breath: Secondary | ICD-10-CM | POA: Diagnosis not present

## 2015-08-31 DIAGNOSIS — I4821 Permanent atrial fibrillation: Secondary | ICD-10-CM

## 2015-08-31 NOTE — Patient Instructions (Signed)
Medication Instructions:   Your physician recommends that you continue on your current medications as directed. Please refer to the Current Medication list given to you today.    If you need a refill on your cardiac medications before your next appointment, please call your pharmacy.  Labwork: NONE ORDER TODAY    Testing/Procedures: NONE ORDER TODAY    Follow-Up:  Your physician wants you to follow-up in:  IN [place] will receive a reminder letter in the mail two months in advance. If you don't receive a letter, please call our office to schedule the follow-up appointment.   Remote monitoring is used to monitor your Pacemaker of ICD from home. This monitoring reduces the number of office visits required to check your device to one time per year. It allows [place] to keep an eye on the functioning of your device to ensure it is working properly. You are scheduled for a device check from home on . 12/01/2015..You may send your transmission at any time that day. If you have a wireless device, the transmission will be sent automatically. After your physician reviews your transmission, you will receive a postcard with your next transmission date.     Any Other Special Instructions Will Be Listed Below (If Applicable).

## 2015-09-04 ENCOUNTER — Encounter: Payer: Self-pay | Admitting: Internal Medicine

## 2015-11-03 ENCOUNTER — Ambulatory Visit: Payer: Self-pay | Admitting: Internal Medicine

## 2015-12-02 DEATH — deceased

## 2015-12-16 ENCOUNTER — Encounter: Payer: Self-pay | Admitting: Family

## 2015-12-29 ENCOUNTER — Ambulatory Visit: Payer: Self-pay | Admitting: Family

## 2015-12-29 ENCOUNTER — Encounter (HOSPITAL_COMMUNITY): Payer: Self-pay
